# Patient Record
Sex: Female | Born: 1935 | Race: White | Hispanic: No | Marital: Married | Smoking: Never smoker
Health system: Southern US, Community
[De-identification: ages and names within clinical notes are randomized; demographics above are authoritative.]

## PROBLEM LIST (undated history)

## (undated) DIAGNOSIS — C50919 Malignant neoplasm of unspecified site of unspecified female breast: Secondary | ICD-10-CM

## (undated) DIAGNOSIS — I4891 Unspecified atrial fibrillation: Secondary | ICD-10-CM

## (undated) DIAGNOSIS — I499 Cardiac arrhythmia, unspecified: Secondary | ICD-10-CM

## (undated) DIAGNOSIS — I10 Essential (primary) hypertension: Secondary | ICD-10-CM

## (undated) DIAGNOSIS — T884XXA Failed or difficult intubation, initial encounter: Secondary | ICD-10-CM

## (undated) DIAGNOSIS — F419 Anxiety disorder, unspecified: Secondary | ICD-10-CM

## (undated) DIAGNOSIS — M1990 Unspecified osteoarthritis, unspecified site: Secondary | ICD-10-CM

## (undated) DIAGNOSIS — E039 Hypothyroidism, unspecified: Secondary | ICD-10-CM

## (undated) DIAGNOSIS — G4730 Sleep apnea, unspecified: Secondary | ICD-10-CM

## (undated) DIAGNOSIS — E119 Type 2 diabetes mellitus without complications: Secondary | ICD-10-CM

## (undated) DIAGNOSIS — T8859XA Other complications of anesthesia, initial encounter: Secondary | ICD-10-CM

## (undated) HISTORY — PX: HERNIA REPAIR: SHX51

## (undated) HISTORY — DX: Hypothyroidism, unspecified: E03.9

## (undated) HISTORY — PX: BREAST SURGERY: SHX581

## (undated) HISTORY — DX: Anxiety disorder, unspecified: F41.9

## (undated) HISTORY — PX: CATARACT EXTRACTION: SUR2

## (undated) HISTORY — PX: ABDOMINAL HYSTERECTOMY: SHX81

## (undated) HISTORY — PX: ABDOMINAL SURGERY: SHX537

## (undated) HISTORY — PX: CHOLECYSTECTOMY: SHX55

## (undated) HISTORY — PX: VEIN LIGATION: SHX2652

## (undated) HISTORY — DX: Type 2 diabetes mellitus without complications: E11.9

## (undated) HISTORY — PX: SMALL INTESTINE SURGERY: SHX150

## (undated) HISTORY — DX: Essential (primary) hypertension: I10

## (undated) HISTORY — PX: BREAST LUMPECTOMY: SHX2

## (undated) HISTORY — PX: OTHER SURGICAL HISTORY: SHX169

## (undated) HISTORY — PX: APPENDECTOMY: SHX54

---

## 1998-03-08 ENCOUNTER — Other Ambulatory Visit: Admission: RE | Admit: 1998-03-08 | Discharge: 1998-03-08 | Payer: Self-pay | Admitting: *Deleted

## 1999-03-27 ENCOUNTER — Other Ambulatory Visit: Admission: RE | Admit: 1999-03-27 | Discharge: 1999-03-27 | Payer: Self-pay | Admitting: *Deleted

## 2000-10-14 ENCOUNTER — Other Ambulatory Visit: Admission: RE | Admit: 2000-10-14 | Discharge: 2000-10-14 | Payer: Self-pay | Admitting: *Deleted

## 2001-07-28 ENCOUNTER — Encounter: Payer: Self-pay | Admitting: Emergency Medicine

## 2001-07-29 ENCOUNTER — Encounter: Payer: Self-pay | Admitting: General Surgery

## 2001-07-30 ENCOUNTER — Encounter: Payer: Self-pay | Admitting: General Surgery

## 2001-07-30 ENCOUNTER — Inpatient Hospital Stay (HOSPITAL_COMMUNITY): Admission: EM | Admit: 2001-07-30 | Discharge: 2001-08-04 | Payer: Self-pay | Admitting: Emergency Medicine

## 2004-05-05 ENCOUNTER — Inpatient Hospital Stay (HOSPITAL_COMMUNITY): Admission: EM | Admit: 2004-05-05 | Discharge: 2004-05-13 | Payer: Self-pay | Admitting: Emergency Medicine

## 2004-05-19 ENCOUNTER — Emergency Department (HOSPITAL_COMMUNITY): Admission: EM | Admit: 2004-05-19 | Discharge: 2004-05-19 | Payer: Self-pay | Admitting: Emergency Medicine

## 2005-01-28 ENCOUNTER — Ambulatory Visit: Payer: Self-pay | Admitting: Internal Medicine

## 2005-01-28 ENCOUNTER — Ambulatory Visit (HOSPITAL_COMMUNITY): Admission: RE | Admit: 2005-01-28 | Discharge: 2005-01-28 | Payer: Self-pay | Admitting: Internal Medicine

## 2006-10-06 HISTORY — PX: BREAST LUMPECTOMY: SHX2

## 2007-02-25 ENCOUNTER — Ambulatory Visit (HOSPITAL_COMMUNITY): Admission: RE | Admit: 2007-02-25 | Discharge: 2007-02-25 | Payer: Self-pay | Admitting: Internal Medicine

## 2007-03-17 ENCOUNTER — Ambulatory Visit: Payer: Self-pay | Admitting: Orthopedic Surgery

## 2007-03-26 ENCOUNTER — Ambulatory Visit (HOSPITAL_COMMUNITY): Admission: RE | Admit: 2007-03-26 | Discharge: 2007-03-26 | Payer: Self-pay | Admitting: Orthopedic Surgery

## 2007-03-29 ENCOUNTER — Ambulatory Visit: Payer: Self-pay | Admitting: Orthopedic Surgery

## 2007-04-28 ENCOUNTER — Ambulatory Visit: Payer: Self-pay | Admitting: Orthopedic Surgery

## 2007-05-22 ENCOUNTER — Emergency Department (HOSPITAL_COMMUNITY): Admission: EM | Admit: 2007-05-22 | Discharge: 2007-05-22 | Payer: Self-pay | Admitting: Emergency Medicine

## 2007-10-28 ENCOUNTER — Observation Stay (HOSPITAL_COMMUNITY): Admission: EM | Admit: 2007-10-28 | Discharge: 2007-10-30 | Payer: Self-pay | Admitting: Emergency Medicine

## 2007-12-15 ENCOUNTER — Emergency Department (HOSPITAL_COMMUNITY): Admission: EM | Admit: 2007-12-15 | Discharge: 2007-12-15 | Payer: Self-pay | Admitting: Emergency Medicine

## 2008-01-09 ENCOUNTER — Inpatient Hospital Stay (HOSPITAL_COMMUNITY): Admission: EM | Admit: 2008-01-09 | Discharge: 2008-01-09 | Payer: Self-pay | Admitting: Emergency Medicine

## 2010-05-22 ENCOUNTER — Ambulatory Visit (HOSPITAL_COMMUNITY)
Admission: RE | Admit: 2010-05-22 | Discharge: 2010-05-22 | Payer: Self-pay | Source: Home / Self Care | Admitting: Internal Medicine

## 2010-10-02 ENCOUNTER — Ambulatory Visit (HOSPITAL_COMMUNITY)
Admission: RE | Admit: 2010-10-02 | Discharge: 2010-10-02 | Payer: Self-pay | Source: Home / Self Care | Attending: Internal Medicine | Admitting: Internal Medicine

## 2010-10-27 ENCOUNTER — Encounter: Payer: Self-pay | Admitting: Internal Medicine

## 2011-02-11 ENCOUNTER — Ambulatory Visit (INDEPENDENT_AMBULATORY_CARE_PROVIDER_SITE_OTHER): Payer: Medicare Other | Admitting: Internal Medicine

## 2011-02-11 DIAGNOSIS — Z86010 Personal history of colonic polyps: Secondary | ICD-10-CM

## 2011-02-11 DIAGNOSIS — Z7901 Long term (current) use of anticoagulants: Secondary | ICD-10-CM

## 2011-02-18 NOTE — Discharge Summary (Signed)
Sheila Blair, Sheila Blair               ACCOUNT NO.:  [id number]   MEDICAL RECORD NO.:  [id number]          PATIENT TYPE:  INP   LOCATION:  A215                          FACILITY:  [facility]   PHYSICIAN:  Kingsley Callander. Ouida Sills, MD       DATE OF BIRTH:  [birth year]   DATE OF ADMISSION:  10/28/2007  DATE OF DISCHARGE:  01/24/2009LH                               DISCHARGE SUMMARY   DISCHARGE DIAGNOSES:  1. Altered mental status  2. Breast cancer.  3. Hypokalemia.  4. Hypertension.  5. Depression.  6. Anxiety.  7. Hyperlipidemia.  8. Impaired fasting glucose.  9. Elevated liver function studies.  10.History of colon adenomas.   PROCEDURES:  1. CT of the brain.  2. MRI of the brain.  3. EEG   HOSPITAL COURSE:  This patient is a 75 year old female who presented  with change in mental status.  She had been experiencing auditory and  visual hallucinations for 3 days.  She had undergone a left breast  lumpectomy and sentinel node procedure last week at [facility].  She had  taken several potential contributing medications including Darvocet,  Xanax and Elavil.  She also had drunk some wine intermittently.   Her initial CT scan of the brain revealed no acute abnormality.  The  radiologist commented on fairly extensive white matter changes and small  remote lacunar-type basal ganglia infarcts.  The patient has a history  of hypertension, impaired fasting glucose and hyperlipidemia.  She does  not smoke   She was seen in consultation by the ACT team.   Xanax was stopped.  She was given a detox regimen with thiamine,  multivitamins, folate and Ativan.  She was seen her in neurology  consultation by Dr. Gerilyn Pilgrim, and MRI of the brain was obtained which  revealed no acute abnormality.  There was no sign of metastatic disease.  Advanced white matter changes were noted and EEG revealed no  abnormalities.  Her TSH was normal at 5.0.  Magnesium was 1.9.  Sed rate  was 40.  She was initially mildly hypokalemic.   She was supplemented  orally to a potassium of 3.5 by discharge.  She had mild transaminase  elevations.  Her SGOT was initially 51, SGPT 41.  Repeat values were 67  and 62.   She showed gradual clearing of her mental status and was back to normal  by the morning of January 24.  Medications are being modified to exclude  Xanax.  Amitriptyline has been reduced to 25 mg at night.  Her  amitriptyline level remains pending.   Her urine drug screen was positive for benzodiazepines only.  This would  have been expected based on her medication use.  Darvocet is being  stopped.   She will have follow-up in the office in 2-3 weeks.  She will have  follow-up at [facility] for breast cancer next week.   DISCHARGE MEDICATIONS:  1. Ativan 1 mg 2-3 times a day.  2. Celexa 40 mg daily.  3. Amitriptyline 25 mg q.h.s.  4. Metoprolol 100 mg b.i.d.  5. Cardizem CD 180 mg  b.i.d.  6. Hydrochlorothiazide 25 mg daily.  7. Benicar 20 mg daily.  8. Aspirin 81 mg daily.  9. Colace 100 mg b.i.d.  10.Citrucel daily.  11.Multivitamin daily.  12.Fish oil b.i.d.  13.Systane p.r.n.  14.Os-Cal.  15.Daily potassium 20 mEq daily.   PLAN:  Electrolytes and LFTs will be rechecked in 2 weeks.      Kingsley Callander. Ouida Sills, MD  Electronically Signed     ROF/MEDQ  D:  10/30/2007  T:  10/31/2007  Job:  [id number]   cc:   Darleen Crocker A. Gerilyn Pilgrim, M.D.  Fax: [phone]

## 2011-02-18 NOTE — H&P (Signed)
Sheila Blair, Sheila Blair               ACCOUNT NO.:  [id number]   MEDICAL RECORD NO.:  [id number]          PATIENT TYPE:  INP   LOCATION:  A312                          FACILITY:  [facility]   PHYSICIAN:  Angus G. Renard Matter, MD   DATE OF BIRTH:  [birth year]   DATE OF ADMISSION:  01/09/2008  DATE OF DISCHARGE:  LH                              HISTORY & PHYSICAL   This 75 year old white female presented to the emergency department with  a chief complaint of abdominal pain.  This occurred after ingestion of  food.  Apparently has had multiple obstructions in the past that  required surgery, most recent was 5 years ago.  She does have breast  cancer in process of radiation treatment.  The pain began on the day of  admission, constant.  X-rays according to the ED physician showed  dilatation of colon but no evidence of small bowel obstruction.  The  patient was given Dilaudid and placed on intravenous fluids and  subsequently admitted.   LABORATORY DATA:  Chemistries - sodium 136, potassium 3.3, chloride 101,  CO2 24, glucose 149, BUN 70, creatinine 0.8.  A CBC showed a white count  of 13,100 with a hemoglobin of 12.3 and hematocrit 35.8.   SOCIAL HISTORY:  The patient does not smoke or drink alcohol except on  occasion.   PAST MEDICAL HISTORY:  History of hypertension, anxiety, bowel  obstruction, breast cancer, depression, hyperlipidemia.   PAST SURGICAL HISTORY:  History of breast lumpectomy, cholecystectomy,  hernia repair, hysterectomy.  The patient has had multiple surgeries for  bowel obstruction in the past.  High blood pressure.   MEDICATION:  1. Benicar 40 mg b.i.d.  2. Cardizem CD 180 mg b.i.d.  3. Celexa 40 mg daily.  4. Citrucel fiber laxative once daily.  5. Colace 100 mg b.i.d.  6. Hydrochlorothiazide 25 mg daily.  7. Lopressor 100 mg b.i.d.  8. Simethicone 80 mg every 4 hours.  9. MiraLax as needed.  10.KCl 20 mEq daily.  11.Zofran 4 mg q.4 h. p.r.n.  12.Ativan 1 mg q.4 h.  p.r.n. pain.   REVIEW OF SYSTEMS:  HEENT:  Negative.  CARDIOPULMONARY:  No chest pain.  No cough or dyspnea.  GI:  Nausea and abdominal pain.  No diarrhea or  bleeding.  GU:  No dysuria or hematuria.   PHYSICAL EXAMINATION:  VITAL SIGNS:  Pending.  HEENT:  Negative.  NECK:  Supple.  No JVD or thyroid abnormalities.  LUNGS:  Clear to P and A.  HEART:  Regular rhythm, no murmurs.  ABDOMEN:  Slightly distended.  Tenderness over the lower abdomen.  No  organomegaly.  PELVIC:  Not performed.  EXTREMITIES:  Free of edema.   ASSESSMENT:  The patient was admitted with abdominal pain thought to be  possible obstruction.  She does have a history of prior surgery for  bowel obstruction.  She does have a history of hypertension, anxiety,  previous history of breast lumpectomy for cancer, cholecystectomy,  hernia repair, hysterectomy.   PLAN:  To continue current IV fluids and pain control.  Will repeat CT  today and obtain appropriate surgical consultation if needed.      Angus G. Renard Matter, MD  Electronically Signed     AGM/MEDQ  D:  01/09/2008  T:  01/09/2008  Job:  [id number]

## 2011-02-18 NOTE — H&P (Signed)
Sheila Blair, DRIVER               ACCOUNT NO.:  [id number]   MEDICAL RECORD NO.:  [id number]          PATIENT TYPE:  INP   LOCATION:  A215                          FACILITY:  [facility]   PHYSICIAN:  Kingsley Callander. Ouida Sills, MD       DATE OF BIRTH:  02-Jun-1936   DATE OF ADMISSION:  10/28/2007  DATE OF DISCHARGE:  LH                              HISTORY & PHYSICAL   CHIEF COMPLAINT:  Change in mental status.   </HISTORY OF PRESENT ILLNESS/  This patient is a 75 year old white female who presented to the  emergency room with her husband due to hallucinations. The symptoms  began 3 days ago.  She has been seeing people in her house and hearing  people in her house. She ran outside in the cold weather barefooted  hollering at these people on the night of admission.  She had been in  her usual state of health until 3 days ago. She had undergone a left  breast lumpectomy with a sentinel node procedure last week. The nodes  were evidently negative. She has a history of depression and anxiety and  has been treated with Celexa, amitriptyline and Xanax.  She has also  consumed a couple of glasses of wine at night.  She stopped drinking  wine for a couple of nights.  She has not had any prior trouble with  withdrawal. She has not experienced fever or any other neurological  problems.  Her husband states that she did have some hallucinations once  after a surgery at [facility] several years ago. She was evaluated in the  emergency room. A head CT revealed no acute abnormality.  She was  treated with doses of Xanax, Ativan and Benadryl.   PAST MEDICAL HISTORY:  1. Hypertension.  2. Depression/anxiety.  3. Hyperlipidemia.  4. Impaired fasting glucose.  5. Colon adenomas.  6. Hysterectomy.  7. Oophorectomy.  8. Cholecystectomy.  9. Partial thyroidectomy.  10.Multiple laparotomies for bowel obstructions.  11.Breast cancer.   MEDICATIONS:  1. Benicar 20 mg daily.  2. Metoprolol 100 mg b.i.d.  3. Cardizem CD  180 mg daily.  4. Hydrochlorothiazide 25 mg daily.  5. Xanax 1 mg b.i.d.  6. Celexa 40 mg daily.  7. Amitriptyline 50 mg q.h.s.  8. Niacin 1000 mg daily  9. Aspirin 81 mg daily.  10.Multivitamin daily.  11.Colace 100 mg b.i.d.  12.Citrucel daily.  13.Darvocet p.r.n.   ALLERGIES:  CODEINE and MORPHINE.   FAMILY HISTORY:  Her mother died at 76 of congestive heart failure.  Her  father had a hip fracture and pneumonia and died at 24. A sister has had  dementia and hypertension. Two daughters are well.   REVIEW OF SYSTEMS:  She is not taking any pain medication. She had taken  Darvocet immediately after the surgery. No fever, difficulty breathing  or problems with her bowel or bladder habits.   PHYSICAL EXAM:  Temperature 98.6, blood pressure 143/79, pulse 74,  respirations 20, oxygen saturation 99%.  GENERAL:  Alert and in no acute distress at present.  HEENT:  Eyes and oropharynx appear  normal.  NECK:  Supple without JVD or thyromegaly.  LUNGS:  Clear.  HEART:  Regular with no murmurs.  ABDOMEN:  Nontender.  No hepatosplenomegaly.  BREASTS:  Steri-Strips are in place on the left breast and in the left  axilla. There is no sign of wound infection.  ABDOMEN:  Nontender. No hepatosplenomegaly.  She has healed surgical  scars.  EXTREMITIES:  Normal pulses.  No edema.  NEURO:  She is fully oriented. She has actively hallucinated though in  the emergency room and is actually seeing people who are not there.  She  has seen these people and heard them and has thought they were former  students and their families. No focal weakness, slurring of speech or  change in facial symmetry.  LYMPH NODES:  No cervical or supraclavicular enlargement.  SKIN:  Warm and dry.   LABORATORY DATA:  White count 6.3, hemoglobin 13.1, platelets 345,000.  Sodium 138, potassium 3.2, bicarb 29, glucose 131, BUN 11, creatinine  0.46.  Urinalysis reveals 0-2 white cells, rare bacteria and a few  epithelial  cells.  The CT scan of the brain reveals no acute  abnormality.   IMPRESSION:  1. Altered mental status with recent psychotic features, including      visual and auditory hallucinations. She really does not display a      typical withdrawal-type syndrome.  She has had some starting and      stopping of both alcohol and benzodiazepines recently. There is no      history of any recreational drug use.  A drug screen is being      obtained.  A neurology consultation will be obtained.  I have      discussed her case with Dr. Gerilyn Pilgrim. Will also check a B12, TSH,      and sed rate.  2. Alcohol use.  Will treat with thiamine, multivitamin, folic acid      and Ativan.  3. Hypokalemia.  Supplement orally.  4. Hypertension.  Continue antihypertensive regimen. She is mildly      hypertensive in the ER.  5. History of depression and anxiety.  Continue Celexa.  6. Hyperlipidemia.  7. Hyperglycemia 131.   ADDENDUM:  She has also been seen by the ACT team, who recommended a  psychiatric consultation.      Kingsley Callander. Ouida Sills, MD  Electronically Signed     ROF/MEDQ  D:  10/28/2007  T:  10/28/2007  Job:  [id number]   cc:   Darleen Crocker A. Gerilyn Pilgrim, M.D.  Fax: [phone]

## 2011-02-18 NOTE — Consult Note (Signed)
NAMECHAUNDA, Sheila Blair               ACCOUNT NO.:  [id number]   MEDICAL RECORD NO.:  [id number]          PATIENT TYPE:  INP   LOCATION:  A215                          FACILITY:  [facility]   PHYSICIAN:  Kofi A. Gerilyn Pilgrim, M.D. DATE OF BIRTH:  June 20, 1936   DATE OF CONSULTATION:  10/28/2007  DATE OF DISCHARGE:                                 CONSULTATION   REASON FOR CONSULTATION:  Altered mental status.   HISTORY:  This is a 75 year old white female who underwent breast biopsy  for suspected left breast malignancy.  Apparently nodes were negative.  The procedure was done about a week ago.  The patient was given Darvocet  after the procedure and the following day developed visual  hallucinations.  The objects are formed and essentially involve people,  some of whom she recognizes.  Oftentimes they are scary however.  She  also has had auditory hallucinations with people talking to her through  the radio or she may be hearing them downstairs trying to break into the  house.  The patient has been very convinced that they are real, though  at the end of our conversation together she does admit that they may not  be real.  Again, however, she does seem to indicate that she believes at  least some part of it is real.  There has not been any focal  neurological deficits but the family does report some dysarthria,  however, no headaches, no fevers.  No visual obscurations other than the  visual hallucinations.  There is a previous history of psychiatric  problem but this is essentially limited to affective disorders.  There  is no psychosis and no history of bipolar disorder.  No loss of  consciousness.  No seizures are reported.   PAST MEDICAL HISTORY:  Hypertension, depression, thyroid disorder,  hyperlipidemia, impaired glucose, colon adenomas, breast cancer status  post lumpectomy a week ago.   PAST SURGICAL HISTORY:  Left breast lumpectomy a week ago.  Hysterectomy, oophorectomy,  cholecystectomy, partial thyroidectomy,  small bowel obstruction.   ADMISSION MEDICATIONS:  1. Benicar 20 mg daily.  2. Metoprolol 100 mg b.i.d.  3. Cardizem CD 180 daily.  4. Hydrochlorothiazide 25 mg daily.  5. Xanax 1 mg b.i.d.  6. Celexa 40 mg daily.  7. Amitriptyline 50 mg daily at bedtime given because of her long      history of chronic headaches.  8. Niacin 1 g daily.  9. Aspirin 81 mg daily.  10.Multivitamin.  11.Colace 100 mg b.i.d.  12.Darvocet p.r.n.  13.Citrucel daily.   ALLERGIES:  MORPHINE, CODEINE.   FAMILY HISTORY:  Positive for congestive heart failure, pneumonia,  dementia, hypertension.   REVIEW OF SYSTEMS:  As stated in history of present illness.  Otherwise,  unrevealing.   PHYSICAL EXAMINATION:  VITAL SIGNS:  Temperature 98.7, blood pressure  129/73, pulse 60, saturation 91% on room air.  GENERAL:  Shows a pleasant, moderately overweight lady in no acute  distress.  HEENT:  Neck is supple.  Head is normocephalic, atraumatic.  ABDOMEN:  Soft.  EXTREMITIES:  No edema though there are some  varicosities in the feet  and legs.  MENTATION:  The patient is awake and alert.  She does seem to have some  mild dysarthria.  She is quite tangential and talkative.  She does  follow commands well.  Language seems to be intact.  CRANIAL NERVES:  Pupils are equal, round and reactive to light and  accommodation.  Extraocular movements are full.  Visual fields are  intact.  Facial muscle strength is symmetric.  Tongue is midline.  Uvula  midline.  Shoulder shrug normal.  Motor examination shows normal tone,  bulk and strength.  There is no pronator drift.  Coordination is intact.  There is no dysmetria, no tremors, no parkinsonism.  Reflexes are +2 and  symmetric with downgoing plantar reflexes.  Sensation normal to  temperature and light touch.   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  This is  worrisome for posterior reversible syndromes, seems out of proportion to  the patient's history of hypertension.  Nothing acute is seen.  There  are similar findings in the frontal areas but to a much less degree.  TSH 5, urine drug screen positive for benzodiazepine, ESR 40, sodium  138, potassium 3.2, chloride 99, CO2 29, BUN 11, creatinine 0.4, glucose  131, WBC 6.3, hemoglobin 13, platelet count of 345.  Urinalysis  negative.  AST 51, ALT 41.   ASSESSMENT:  Acute onset of visual and auditory hallucinations.  I do  not believe this is a primary psychiatric problem.  I believe this is  mostly an organic issue.  I am very suspicious of the Darvocet which was  started raising the possibility of a medication effect.  Given that I am  also concerned about her CT scan findings certainly occipital and  parietal lesions can be associated with visual hallucinations.  Given CT  findings one is posterior reversible syndrome such as hypertensive  encephalopathy.  Certainly given history of breast cancer she certainly  could have a lesion that we are not seeing on CT scan.  Rarely  hallucinations can be associated with seizures.  This is however again  rare.   RECOMMENDATIONS:  1. MRI with and without contrast.  2. EEG.  3. Obtain biopsy results from [facility].  4. I think if possible should reduce the dose of the Elavil which can      cause problems in elderly patients; agree with the amitriptyline      level.  5. The patient's symptoms can be treated with low dose neuroleptics if      they remain problematic.   Thank you for this consultation.     Kofi A. Gerilyn Pilgrim, M.D.  Electronically Signed    KAD/MEDQ  D:  10/29/2007  T:  10/29/2007  Job:  [id number]

## 2011-02-18 NOTE — Procedures (Signed)
Sheila Blair, MURLEY               ACCOUNT NO.:  [id number]   MEDICAL RECORD NO.:  [id number]          PATIENT TYPE:  INP   LOCATION:  A215                          FACILITY:  [facility]   PHYSICIAN:  Kofi A. Gerilyn Pilgrim, M.D. DATE OF BIRTH:  April 23, 1936   DATE OF PROCEDURE:  DATE OF DISCHARGE:                              EEG INTERPRETATION   INDICATIONS:  This is 75 year old lady who presents with altered mental  status, confusional spells, suspicious for seizures.   MEDICATIONS:  Amitriptyline, aspirin, Benicar, Cardizem, Celexa,  Lopressor, hydrochlorothiazide, niacin.   ANALYSIS:  A 16-channel recording is conducted for approximately 24  minutes using standard 10/20 measurements.  There is a well-formed pulse  rhythm of 7 Hertz which attenuates with eye opening.  There is beta  activity noted in the frontal areas.  Awake and drowsy activities are  recorded.  Photic stimulation and hyperventilation are conducted without  significant changes int background activity.  There is no focal or  lateralizing slowing.  There is no epileptiform activity observed.   IMPRESSION:  Normal recording, awake and drowsy states.  A single  recording does not rule out epileptic seizures. A clinically indicated  sleep-deprived recording could be useful.      Kofi A. Gerilyn Pilgrim, M.D.  Electronically Signed     KAD/MEDQ  D:  10/29/2007  T:  10/30/2007  Job:  [id number]

## 2011-02-21 ENCOUNTER — Other Ambulatory Visit (INDEPENDENT_AMBULATORY_CARE_PROVIDER_SITE_OTHER): Payer: Self-pay | Admitting: Internal Medicine

## 2011-02-21 ENCOUNTER — Encounter (HOSPITAL_BASED_OUTPATIENT_CLINIC_OR_DEPARTMENT_OTHER): Payer: Medicare Other | Admitting: Internal Medicine

## 2011-02-21 ENCOUNTER — Ambulatory Visit (HOSPITAL_COMMUNITY)
Admission: RE | Admit: 2011-02-21 | Discharge: 2011-02-21 | Disposition: A | Discharge status: Home / Self Care | Payer: Medicare Other | Source: Ambulatory Visit | Attending: Internal Medicine | Admitting: Internal Medicine

## 2011-02-21 DIAGNOSIS — Z860100 Personal history of colon polyps, unspecified: Secondary | ICD-10-CM | POA: Insufficient documentation

## 2011-02-21 DIAGNOSIS — Z853 Personal history of malignant neoplasm of breast: Secondary | ICD-10-CM | POA: Insufficient documentation

## 2011-02-21 DIAGNOSIS — D126 Benign neoplasm of colon, unspecified: Secondary | ICD-10-CM

## 2011-02-21 DIAGNOSIS — K5730 Diverticulosis of large intestine without perforation or abscess without bleeding: Secondary | ICD-10-CM | POA: Insufficient documentation

## 2011-02-21 DIAGNOSIS — E119 Type 2 diabetes mellitus without complications: Secondary | ICD-10-CM | POA: Insufficient documentation

## 2011-02-21 DIAGNOSIS — Z79899 Other long term (current) drug therapy: Secondary | ICD-10-CM | POA: Insufficient documentation

## 2011-02-21 DIAGNOSIS — I10 Essential (primary) hypertension: Secondary | ICD-10-CM | POA: Insufficient documentation

## 2011-02-21 NOTE — Consult Note (Signed)
NAME:  Sheila Blair, Sheila Blair NO.:  [id number]   MEDICAL RECORD NO.:  [id number]                   PATIENT TYPE:  EMS   LOCATION:  ED                                   FACILITY:  [facility]   PHYSICIAN:  Barbaraann Barthel, M.D.              DATE OF BIRTH:  1936-01-10   DATE OF CONSULTATION:  05/19/2004  DATE OF DISCHARGE:                                   CONSULTATION   Surgery was asked to see this 75 year old white female who is 14 days status  post small-bowel resection for closed loop obstruction.  She presented in  the emergency room with nausea and vomiting and bloating with a high-grade  obstruction noted on acute abdominal series and CT scan.  She had  approximately 1400 ml of aspirate from her NG.  I reviewed these films with  the radiologist, and we had planned to take her to surgery.   Anesthesiology was not available to provide the difficult bronchoscopic  endoscopic intubation necessary with this patient who has been previously  identified as one who has an extremely difficult airway.  Therefore, I was  unable to take her to surgery and discussed with in detail with Dr.  Gerarda Fraction, giving him details of her previous surgery including her previous  surgeries with repair and abdominal wall repair done at [facility], etc., and he  accepted this patient in transfer.  In essence, we are transferring her  because we are unable to safely provide an airway on this patient, and in my  opinion, she needs to go to surgery sooner rather than later.   We then made plans for her transfer.      ___________________________________________                                            Barbaraann Barthel, M.D.   WB/MEDQ  D:  05/19/2004  T:  05/19/2004  Job:  [id number]   cc:   Rhae Lerner. Margretta Ditty, M.D.  [address]  [place]  [place] [address]   Danice Goltz, M.D. [facility]   Kingsley Callander. Ouida Sills, M.D.  [address]  [place]  [place] [address]  Fax: [phone]

## 2011-02-21 NOTE — Consult Note (Signed)
[facility]  Patient:    Sheila Blair, Sheila Blair Visit Number: [id number] MRN: [id number]          Service Type: MED Location: 3A A310 01 Attending Physician:  Barbaraann Barthel Dictated by:   Carylon Perches, M.D. Proc. Date: 07/29/01 Admit Date:  07/28/2001 Discharge Date: 08/04/2001   CC:         Barbaraann Barthel, M.D.   Consultation Report  CHIEF COMPLAINT:  Abdominal pain.  HISTORY OF PRESENT ILLNESS:  This patient is a 75 year old white female who presented to the emergency room by ambulance complaining of abdominal pain and distention.  She is three weeks status post a bladder tack and right oophorectomy at [facility].  This was complicated by postop ileus which required a week-long rehospitalization there.  She had been recuperating fairly smooth, though, until approximately 3 p.m. on the day of admission when she had sudden onset of crampy abdominal pain.  She experienced heaving.  She has had no recent fever or chills.  She had normal bowel movements prior to that.  There had been no rectal bleeding.  She was found to have a partial small-bowel obstruction and was hospitalized for nasogastric suction, bowel rest, and IV hydration.  She has been evaluated surgically by Dr. Malvin Johns, and the request was made for medical followup while hospitalized.  PAST MEDICAL HISTORY: 1. Laparoscopic cholecystectomy in 1995. 2. Left oophorectomy in 1965. 3. Thyroid surgery for benign tumor in 1966. 4. Left leg vein ligation in 1985. 5. Hypertension.  MEDICATIONS: 1. Lotrel 5/20 q.d. 2. Atenolol 50 mg q.d. 3. HCTZ 25 mg q.d. 4. Paxil 20 mg q.d. 5. Ativan 1 mg q.h.s.  ALLERGIES:  CODEINE and MORPHINE.  FAMILY HISTORY:  Her mother died at 62 of congestive heart failure.  Her father died at 15 and had had a hip fracture and pneumonia.  She has a sister with hypertension and two daughters who are well.  REVIEW OF SYSTEMS:  Her appetite had been fine.  There had  been no weight loss.  She has had no fevers, chills, difficulty breathing, or difficulty voiding after her catheter was removed.  No dysuria or gross hematuria.  PHYSICAL EXAMINATION:  VITAL SIGNS:  Temperature 97.2, pulse 67, blood pressure 116/70 (initially 77/48 in the ER).  GENERAL:  Alert and comfortable at the time of my exam, although she had reportedly been diaphoretic and extremely uncomfortable on presentation.  HEENT:  No scleral icterus.  Oropharynx is unremarkable.  NECK:  Supple with no thyromegaly or lymphadenopathy.  She has a well-healed thyroidectomy scar.  LUNGS:  Clear.  HEART:  Regular with no murmurs.  ABDOMEN:  Nondistended, nontender.  No hepatosplenomegaly or palpable mass.  A nasogastric tube and a Foley catheter are in placed.  EXTREMITIES:  No calf tenderness, edema, cyanosis, or clubbing.  NEUROLOGIC:  Grossly intact.  LABORATORY DATA:  See printout.  IMPRESSION: 1. Small-bowel obstruction postoperative pelvic surgery.  She has responded    quite favorably thus far to nasogastric suction, intravenous hydration,    and bowel rest.  Her pain is presently relieved.  She has had approximately    1500 cc of nasogastric fluid output.  Will follow with repeat bowel films. 2. Hypokalemia.  Her serum potassium has normalized on intravenous    supplementation. 3. Hypertension.  Would advise atenolol, hydrochlorothiazide, and Lotrel be    held for now. 4. Postoperative depression.  Continue Paxil when she is able to swallow    liquids. 5. Abnormal urinalysis.  She has white cells in the urine.  A urine culture    will be obtained. Dictated by:   Carylon Perches, M.D. Attending Physician:  Barbaraann Barthel DD:  07/29/01 TD:  07/30/01 Job: [id number]

## 2011-02-21 NOTE — Consult Note (Signed)
NAME:  Sheila Blair, Sheila Blair                         ACCOUNT NO.:  [id number]   MEDICAL RECORD NO.:  [id number]                   PATIENT TYPE:  INP   LOCATION:  A319                                 FACILITY:  [facility]   PHYSICIAN:  Barbaraann Barthel, M.D.              DATE OF BIRTH:  1935/11/20   DATE OF CONSULTATION:  05/05/2004  DATE OF DISCHARGE:                                   CONSULTATION   REASON FOR CONSULTATION:  Surgery was asked to see this 75 year old white  female with abdominal pain, nausea, and bloating.   HISTORY OF PRESENT ILLNESS:  The patient states that she was in her  otherwise state of good health with no change in her bowel habits, when last  night at around 11 o'clock after a rather heavy meal of cream sauces, sour  cream, etc., she became bloated and developed nausea and distention.  She  came to the emergency room.  An acute abdominal series showed a partial  small bowel obstruction.  Surgery was consulted.  The patient had no  vomiting and she passed a small amount of gas later on in the evening.  However, she did not improve and came to the emergency room this morning.   PHYSICAL EXAMINATION:  GENERAL:  Discloses a pleasant but uncomfortable  appearing 75 year old white female in no acute distress other than her  discomfort.  VITAL SIGNS:  Her temperature is 97.2, blood pressure 122/79, heart rate  90/minute, respirations 20/minute.  HEENT:  Head is normocephalic.  Eyes, extraocular movements are intact.  Pupils are round and reactive to light and accommodation.  No conjunctival  edema and sclerae is a normal tincture.  The patient has had previous  cataract surgery.  Nose and oral mucosa are moist.  NECK:  Supple and cylindrical without jugular venous distention,  thyromegaly, or tracheal deviation.  There is no cervical adenopathy and no  bruits auscultated.  HEART:  Regular rhythm.  LUNGS:  Clear.  BREASTS/AXILLA:  Without masses.  ABDOMEN:  Distended and  tympanic.  Bowel sounds are hypoactive but present.  The patient has no visceromegaly.  I do not appreciate any incisional  hernia.  She has a previous midline incision but no obvious incarcerated  hernia here or any groin hernias.  RECTAL:  The stool is guaiac negative.  EXTREMITIES:  Within normal limits.  Pulses are symmetrical.  There is no  pedal edema.  The patient has had a right vein ligation in the past.   PAST SURGICAL HISTORY:  She has had multiple surgeries.  It should noted  that almost all of her abdominal surgery has been characterized with  sluggish return to normal bowel activity and she has had previous partial  small bowel obstructions following her surgeries including a lysis of  adhesions for a small bowel obstruction in 2002.  In 2004 she had a ventral  hernia repair at [facility], which also was  complicated with a prolonged ileus in  2004.  Other surgeries have included an ovarian tumor resection in 1965.  Partial thyroidectomy for a benign process in 1966.  Right vein ligation in  1968.  Hysterectomy in 1985.  Laparoscopic gallbladder surgery in 1993.  Vaginal prolapse repair, bladder repair, and oophorectomy in 2002.  This was  complicated later with an intestinal blockage.  I operated on her for that  in 2002 after her previous surgery at [facility].  She also had a rectocele repair  in January 2004 and a ventral hernia repair in April 2004, which was as  stated complicated by an ileus.   PAST MEDICAL HISTORY:  1. Hypertension.  2. Anxiety.   MEDICATIONS:  1. Atenolol 50 mg p.o. b.i.d.  2. Lotrel 5/20 mg 2 times a day.  3. Hydrochlorothiazide 25 mg daily.  4. Amitriptyline 50 mg a day.  5. Niacin 500 mg a day.  6. Aspirin 81 mg a day.  7. Colace 100 mg a day.  8. Citrucel 10.2 g a day.  9. Fish oil 100 mg a day.  10.      Multivitamin.  11.      Calcium with D 600 mg 2 times a day.  12.      Clonidine, I am not sure about the dose, b.i.d.   ALLERGIES:  No known  allergies.   REVIEW OF SYSTEMS:  CARDIORESPIRATORY:  The patient has had no previous  history of a myocardial infarction.  She is a nonsmoker and she does not  abuse alcohol.  She is hypertensive and takes atenolol and Lotrel.  GASTROINTESTINAL:  History of multiple problems with prolonged ileus and  constipation.  Acute history of nausea, bloating, and distention.  No  vomiting.  She has had no previous history of hepatitis.  Previous abdominal  surgeries included a cholecystectomy as well as an appendectomy in the past.  The patient also underwent a colonoscopy and polypectomy in the past.  She  gives no  history, apart from her constipation which has been controlled  with her Citrucel and stool softeners, she has had no bright red rectal  bleeding, black tarry stools, or change in her bowel habits.  Her last time  she passed gas was last night.  OB/GYN:  She is a gravida 2, para 2,  cesarean 0, abortus 0 female who has undergone several pelvic surgeries,  which have included an ovarian cystectomy and later an oophorectomy followed  by a total abdominal hysterectomy and oophorectomy.  She has undergone  recently an anterior and posterior repair, which was complicated by a hernia  and was repaired with mesh as well.  GENITOURINARY:  She has had a history  of urinary tract infection.  Her urinalysis shows that she may have a  urinary tract infection at present.  She has had a bladder tack in the  present.  No past history of nephrolithiasis.  ENDOCRINE:  The patient has  undergone a partial thyroidectomy in the past for benign disease.  She is on  no thyroid replacement.  MUSCULOSKELETAL:  Grossly within normal limits.   LABORATORY DATA:  The patient has had an acute abdominal series, which shows  a partial small bowel obstruction.  Her white count is 8.0 with an H&H of 14.5 and 41.5 with 83 neutrophils.  Her electrolytes show 3.1 hypokalemia.  Her BUN is 19 and creatinine is 0.9.  Her  lipase was reported as normal.  Her urinalysis shows bacteria.  A CT scan is pending.   IMPRESSION:  1. Partial small bowel obstruction with hypokalemia.  2. Questionable urinary tract infection.  3. Hypertension.  4. Anxiety.  5. Chronic constipation.   PLAN:  We have initiated hydration and electrolyte replacement.  I have  placed an NG tube.  We will probably need to repeat the CT scan, as I  removed quite a bit of contrast medium from her stomach.  We will follow her  with serial acute abdominal series and see if there are any changes  requiring surgery.  Hopefully, she will open up with a nonoperative  approach.  Otherwise, if she has any acute changes suggesting complete small  bowel obstruction or no improvement we will plan for a laparotomy and lysis  of adhesions.  I suspect this process is secondary to these due to her  multiple intraabdominal and pelvic surgeries.  I will follow her closely  with the medical service.  Thank you for the consultation.      ___________________________________________                                            Barbaraann Barthel, M.D.   WB/MEDQ  D:  05/05/2004  T:  05/05/2004  Job:  [id number]   cc:   Delbert Harness, MD   Kingsley Callander. Ouida Sills, M.D.  [address]  [place]  [place] [address]  Fax: [phone]

## 2011-02-21 NOTE — Discharge Summary (Signed)
NAMEYOCELINE, BAZAR               ACCOUNT NO.:  [id number]   MEDICAL RECORD NO.:  [id number]          PATIENT TYPE:  INP   LOCATION:  A312                          FACILITY:  [facility]   PHYSICIAN:  Angus G. Renard Matter, MD   DATE OF BIRTH:  Jun 09, 1936   DATE OF ADMISSION:  01/09/2008  DATE OF DISCHARGE:  04/05/2009LH                               DISCHARGE SUMMARY   HISTORY:  This is a 75 year old white female admitted on January 09, 2008,  and discharged January 09, 2008, one-day hospitalization.   DIAGNOSES:  High-grade small-bowel obstruction due to adhesions and  central mesentery involving the middistal ileum; small left adrenal  gland lesion, likely benign adenoma; anterior abdominal wall hernia  through the mesh, there is a part of transverse colon in it;  hypertension; hyperlipidemia; history of breast cancer; and depression.   The patient's condition stable at time of her transfer.  This 71-year-  old white female presented to the emergency department with a chief  complaint of abdominal pain.  This occurred after ingestion of food.   The patient has had multiple bowel obstructions in the past that  required surgery, most recent was 5 years ago.  She does have a history  of breast cancer.  She is in the process of irradiation treatment.  The  pain in her abdomen began on the day of admission, it was constant.  X-  rays according to ED physician showed dilatation of colon, but no  evidence of small-bowel obstruction.  The patient was given Dilaudid,  placed on intravenous fluids, and admitted.  Subsequent CT of the  abdomen did show evidence of obstruction, high-grade small bowel due to  adhesions and central mesentery involving the mid distal ileum, anterior  abdominal wall hernia just to the left  of the midline through the mesh  containing colon.   PHYSICAL EXAMINATION:  HEENT: Negative.  NECK: Supple.  No JVD or thyroid abnormalities.  LUNGS: Clear P&A.  HEART: Regular.  No  murmurs.  ABDOMEN: Slightly distended, tenderness over the lower abdomen.  No  organomegaly.  PELVIC: Not performed.  EXTREMITIES:  Free of edema.   LABORATORY DATA:  CBC:  WBC 11,100 with hemoglobin 12.3 and hematocrit  35.8.  Chemistries:  Sodium 136, potassium 3.3, chloride 101, CO2 of 24,  glucose 149, BUN 17, and creatinine 0.80.  GFR greater than 60.  X-rays:  Acute abdominal series on admission, chronic colonic and small bowel  dilatation, could not rule out recurrent distal obstruction.  A CT of  the abdomen showed evidence of high-grade small-bowel obstruction due to  adhesions and central mesentery involving the mid distal ileum; stable  benign-appearing hepatic cyst, status post cholecystectomy; stable small  left adrenal gland lesion, likely benign adenoma; anterior abdominal  wall hernia through the mesh that had part of the transverse colon in  it.   HOSPITAL COURSE:  The patient at the time of her admission was placed on  normal saline at 100 mL an hour and clear liquid diet.  She was given  Dilaudid 1 mg every 3 to 4 hours p.r.n.  for pain and Zofran 4 mg every 6  hours p.r.n. for nausea.  She was continued on Benicar 40 mg b.i.d.,  Cardizem CD 180 mg b.i.d., Celexa 40 mg daily, Citrucel fiber laxative 1  tablespoon at bedtime, Colace 100 mg b.i.d., hydrochlorothiazide 25 mg  daily, Lopressor 100 mg b.i.d., Ativan 1 mg at bedtime, potassium 20 mEq  daily, Os-Cal 500 mg p.o. daily, and Lovenox subcutaneously daily.  The  patient remained fairly comfortable following her admission.  She  desired to go back to [facility] for surgery.  She said she had  been receiving her chemotherapy there as well as prior surgery there.  I  have discussed the situation with the physicians at [facility], and they  agreed to take her under the services of Dr. Lowell Guitar, Oncology.  The  patient was stable at the time of her discharge.   DISCHARGE MEDICATIONS:  1. Benicar 40 mg b.i.d.  2.  Cardizem CD 180 mg b.i.d.  3. Celexa 40 mg daily.  4. Citrucel fiber laxative 1 tablespoon daily.  5. Colace 100 mg b.i.d.  6. Hydrochlorothiazide 25 mg daily.  7. Lopressor 100 mg b.i.d.  8. Systane eye drops one daily.  9. Simethicone 80 mg every four hours.  10.MiraLax as needed.  11.Ativan 1 mg at night as needed.  12.Potassium chloride 20 mEq daily.   DISPOSITION:  The patient was stable at the time of her transfer to  [facility].      Harrison G. Renard Matter, MD  Electronically Signed     AGM/MEDQ  D:  01/28/2008  T:  01/28/2008  Job:  [id number]

## 2011-02-21 NOTE — Op Note (Signed)
[facility]  Patient:    Sheila Blair, Sheila Blair Visit Number: [id number] MRN: [id number]          Service Type: MED Location: 3A A310 01 Attending Physician:  Barbaraann Barthel Dictated by:   Barbaraann Barthel, M.D. Proc. Date: 07/30/01 Admit Date:  07/28/2001   CC:         Carylon Perches, M.D.   Operative Report  PREOPERATIVE DIAGNOSIS:  Small-bowel obstruction.  POSTOPERATIVE DIAGNOSIS:  Small-bowel obstruction.  PROCEDURE:  Exploratory laparotomy with lysis of adhesions and release of small bowel obstruction.  SURGEON:  Barbaraann Barthel, M.D.  ANESTHESIA:  NOTE:  This is a 75 year old white female who is approximately 24 or 25 days status post pelvic surgery at [facility], at which time she had a colpopexy and Burch procedure.  She presented to the emergency room with distention, nausea and vomiting and hypotension.  She was admitted after hydration and serial acute abdominal series revealed no improvement of her bowel pattern and clinically no resolution of her partial small-bowel obstruction and she was taken to surgery.  We discussed preoperatively in detail the surgery including, but not limiting a discussion of risks to include bleeding, infection and the possibility that she may require bowel resection and an unlikely but possible colostomy. Informed consent was obtained.  GROSS OPERATIVE FINDINGS:  The patient had in the area of the mid-jejunum, a loop of bowel adhesed into the pelvis with a thick, approximately 1-cm to a 1-1/2-cm annular adhesion around the bowel.  This was released.  She also was noted to have the portion of her rectosigmoid adherent to her fascial closure. This was likewise released.  No other abnormalities were encountered.  The patient was status post TAH/BSO and appendectomy and gallbladder surgery.  The rest of her small bowel, apart from the dilated proximal loops of bowel, was normal.  The colon was also  normal.  TECHNIQUE:  The patient was placed in a supine position and after the adequate administration of general anesthesia via endotracheal intubation, her entire abdomen was prepped with Betadine solution and draped in the usual manner.  An infraumbilical midline incision was carried out and the abdomen was opened through the midline, carefully dissecting free adhesions down into the pelvis where a portion of the mid-jejunum was identified as being dilated and adhesed.  This was released.  There was bruising of the mesentery but after observing the bowel, the bowel pinked up very well with obvious peristalsis noted and appeared viable without the need for resection.  The patient was also noted to have the rectosigmoid involved in the fascial closure from her previous Pfannenstiel incision.  This was released as well.  We then checked for hemostasis; this was deemed complete.  The abdomen was then irrigated with normal saline solution.  We again checked the area of the release of the small bowel; this appeared to be completely viable.  We checked then the position of the NG tube and then returned the intestines to the abdominal cavity, placing the omentum over the intestines, and then closing with figure-of-eight interrupted monofilament through-and-through sutures.  Down towards the Pfannenstiel portion of the incision, this had to be repaired carefully as there was a swiss cheese-type of defect from an incisional hernia in this area.  This was repaired with through-and-through 0 Monocryl sutures.  Subcu was irrigated and the skin was approximated with a stapling device.  Prior to closure, all sponge, needle and instrument counts were found to be correct. Estimated blood  loss was minimal.  The patient received approximately 2 L of crystalloids intraoperatively, no drains were placed and there were no complications. Dictated by:   Barbaraann Barthel, M.D. Attending Physician:  Barbaraann Barthel DD:  07/30/01 TD:  08/02/01 Job: [id number]

## 2011-02-21 NOTE — Discharge Summary (Signed)
NAME:  Sheila Blair, Sheila Blair                         ACCOUNT NO.:  [id number]   MEDICAL RECORD NO.:  [id number]                   PATIENT TYPE:  INP   LOCATION:  A227                                 FACILITY:  [facility]   PHYSICIAN:  Barbaraann Barthel, M.D.              DATE OF BIRTH:  06-09-1936   DATE OF ADMISSION:  05/05/2004  DATE OF DISCHARGE:  05/13/2004                                 DISCHARGE SUMMARY   DISCHARGE DIAGNOSIS:  Small-bowel obstruction.   SECONDARY DIAGNOSES:  1. Hypertension.  2. Hyperlipidemia.  3. Anxiety.   PROCEDURE:  On May 05, 2004, exploratory laparotomy with small-bowel  resection.   CONSULTATIONS:  1. [facility].  2. Hospitalist service.   HOSPITAL COURSE:  This is a 75 year old white female who were admitted  through the emergency room with abdominal pain. She was found to have a  small-bowel obstruction on plain films, and CT scan revealed that this might  likely be a closed loop obstruction. We took her to surgery after the CT  revealed that possibility, and indeed, we found a closed loop obstruction  approximately 8 inches from the ileocecal valve, and an approximately 10-  inch segment of small bowel was resected with a primary side to side stapled  anastomosis. The patient did quite well postoperatively. She did develop  some atrial fibrillation which was treated by the hospitalist and the  cardiologist, and her rate stabilized, and her heart rate was regulated  postoperatively.   Her hospital course otherwise unremarkable. Her Foley and NG tube were  removed on the fourth postoperative day. She did develop her atrial  fibrillation which happened in the perioperative period, and she was treated  in the intensive care unit for this, and she did well. She was transferred  to the second floor and monitored there without any further problems.   Her wound remained clean. She did have some serous drainage from her wound  which ceased and  without any signs of any infection. This is due to the fact  that she has essentially no fascia there and has a mesh that opened and  reapproximated to close her abdominal wall. We will keep the surgical  staples in her for a little longer due to this reason. Her wound remains  clean without any further drainage. Her bowels are moving well, and she is  tolerating p.o. well without any shortness of breath or chest pain or leg  pain and doing quite well.   The rest of her laboratory work, her last EKG showed that she was in normal  sinus rhythm. As stated, her previous CT scans showed the possibility of a  closed loop obstruction which was confirmed and an acute abdominal series  which showed a partial small-bowel obstruction. Pathology revealed a segment  of terminal ileum with bowel wall ischemia.   Rest of laboratory work:  Her white count on May 12, 2004 was  6.9 with a H  and H of 12.1 and 34.5. Her electrolytes were all grossly within normal  limits. Her BUN was 5 and her creatinine 0.6. Urinalysis showed some  bacteria on admission. She was treated perioperatively with antibiotics for  her bowel resection, and this was not a catheterized specimen. She has had  no complaints of dysuria or anything postoperatively.   DISCHARGE INSTRUCTIONS:  She is discharged on Diltiazem 180 mg p.o. b.i.d.  and Lopressor 100 mg p.o. b.i.d. She is told to stop the hydrochlorothiazide  and atenolol and clonidine and Lotensin at present. The rest of  instructions:  She is permitted a full liquid and soft diet. She is to  increase her activities as tolerated. She is told that she may shower. I do  not want her to go into any swimming pools or any Jacuzzis or swimming in  the lake, etc. She is told to clean her wound with alcohol three times a  day. She is restricted from doing driving or heavy lifting or sexual  activity at present. We will follow her perioperatively after which she is  to return to  Dr. Ouida Sills for any problems she may have medically.     ___________________________________________                                         Barbaraann Barthel, M.D.   WB/MEDQ  D:  05/13/2004  T:  05/13/2004  Job:  [id number]   cc:   Kingsley Callander. Ouida Sills, M.D.  [address]  [place]  [place] [address]  Fax: [phone]

## 2011-02-21 NOTE — Op Note (Signed)
NAME:  Sheila Blair, Sheila Blair                         ACCOUNT NO.:  [id number]   MEDICAL RECORD NO.:  [id number]                   PATIENT TYPE:  INP   LOCATION:  A319                                 FACILITY:  [facility]   PHYSICIAN:  Barbaraann Barthel, M.D.              DATE OF BIRTH:  Sep 30, 1936   DATE OF PROCEDURE:  05/05/2004  DATE OF DISCHARGE:                                 OPERATIVE REPORT   PREOPERATIVE DIAGNOSIS:  Small bowel obstruction, closed loop.   POSTOPERATIVE DIAGNOSIS:  Small bowel obstruction, closed loop.  Ischemic  small intestine.   OPERATION PERFORMED:  Exploratory laparotomy, lysis of adhesions, small  bowel resection with primary side-to-side stapled anastomosis, repair of  ventral hernia.   SURGEON:  Barbaraann Barthel, M.D.   ASSISTANT:  Tilda Burrow, M.D.   ANESTHESIA:  General.   INDICATIONS FOR PROCEDURE:  The patient is a 75 year old white female who  came to the emergency room with bloating, nausea and abdominal distention,  who  had a normal white blood cell count with an acute abdominal series that  revealed partial small bowel obstruction.  CT scan was done and this looked  like a closed loop type of obstruction.  She also had hypokalemia.  We  hydrated her and replaced her electrolytes and took her to surgery after  discussing the surgery with the family in detail, discussing complications  not limited to but including bleeding, infection, anastomotic leaks and  informed consent was obtained.   GROSS OPERATIVE FINDINGS:  The patient had approximately 10 inches of small  bowel in the terminal ileum approximately eight inches from the ileocecal  valve that was volvulized with an annular adhesion about it as well that was  ischemic.  This was resected and remained pink and viable during the entire  case.  No other abnormalities were encountered.  The patient had previous  gallbladder surgery, previous hysterectomy and bilateral salpingo-  oophorectomy  and appendectomy.  No other abnormalities were encountered  intraoperatively.   DESCRIPTION OF PROCEDURE:  The patient was placed in supine position. After  adequate administration of general anesthesia which required Dr. Marcos Eke to  perform an oral endoscopically assisted endotracheal intubation, the patient  was placed in the supine position and the abdomen was prepped with Betadine  solution and draped in the usual manner after a Foley catheter was  aseptically inserted.  A midline incision was carried out above her previous  infraumbilical incision as this patient had had a history of mesh being  placed there.  I wanted to enter the abdomen in an area that had been  previously unscarred.  Skin and subcutaneous tissue were incised.  The  abdomen was opened in the midline.  Then with careful dissection, the mesh  was opened below the umbilicus and the abdomen was opened and explored with  the above findings.  Adhesions were taken down using the LDS stapling  device.  There were multiple adhesions around this area and in the pelvis.  All involving the bowel in this area were lysed.  At a distance  approximately eight inches from ileocecal valve, the distal portion of the  small bowel was divided using the GIA stapling device and approximately 10  inches of bowel was involved in this obstruction.  This was proximally  likewise divided using the GIA stapler proximally.  We then used a GIA  stapler to perform a side-to-side anastomosis after placing a 3-0 GI silk  suture distally as a stay suture and closing the anastomosis with a TA-55  stapler. We then changed gloves, irrigated, approximated the mesentery which  had been divided and ligated with 2-0 silk sutures and closed the mesentery  with 3-0 PDS.  After irrigation, we then approximated the mesh that had been  open with interrupted 0 Novofil or Prolene sutures and then approximated  with the skin with a stapling device.  The  subcutaneous tissue was irrigated  and the skin was approximated with a stapling device.  No drains were  placed.  Prior to closure, all sponge, needle and instrument counts were  found to be correct.  The estimated blood loss was approximately 150 mL or  less.  The patient received 2700 mL of crystalloid intraoperatively.  There  were no complications.  She was extubated without a problem and taken to the  intensive care unit to react and to follow there.  I will have the  hospitalist see her perioperatively as he saw her preoperatively and also  notify Dr. Ouida Sills of her admission.      ___________________________________________                                            Barbaraann Barthel, M.D.   WB/MEDQ  D:  05/05/2004  T:  05/06/2004  Job:  [id number]   cc:   Delbert Harness, MD   Kingsley Callander. Ouida Sills, M.D.  [address]  [place]  [place] [address]  Fax: [phone]

## 2011-02-21 NOTE — Consult Note (Signed)
[facility]  Patient:    Sheila Blair, Sheila Blair Visit Number: [id number] MRN: [id number]          Service Type: OBV Location: 3A A310 01 Attending Physician:  Annamarie Dawley Dictated by:   Barbaraann Barthel, M.D. Proc. Date: 07/28/01 Admit Date:  07/28/2001   CC:         Dr. Tedra Senegal, M.D.                          Consultation Report  Surgery was asked to see this 75 year old white female for abdominal pain.  CHIEF COMPLAINT:  Crampy abdominal pain with nausea and vomiting and hypotension on admission to the emergency room.  HISTORY OF PRESENT ILLNESS:  The patient approximately 23 days previously had undergone an extensive pelvic reconstruction surgery at [facility] which required a procedure that according to her daughter who is a nurse at least six hours or so.  This involved a burch procedure and a placement of a mesh to use as a colpopexy for vaginal prolapse.  This surgery was complicated by readmission to [facility] for an ileus and since that time she has had improvement until this afternoon at approximately 3:00 where she developed some crampy abdominal pain.  She tried to induce vomiting herself and she tried to force having a bowel movement.  The crampy pain continued and she came to the emergency room where she was agitated and vomiting and complaining of abdominal pain.  She had a small bowel movement today but she states that she had a normal bowel movement yesterday.  PHYSICAL EXAMINATION  GENERAL:  Agitated and uncomfortable appearing 75 year old white female.  VITAL SIGNS:  Weight 160 pounds, 5 feet 8 inches, blood pressure 77/48 (with IV hydration now 140/70), heart rate approximately 70 per minute, O2 saturation 100%, temperature 97.0, respirations 20 per minute.  HEENT:  Head is normocephalic.  Eyes:  Extraocular movements are intact. Pupils are round and react to light and accommodation.  There is no conjunctival pallor  or scleral injection.  Sclerae is normal tincture.  Nose and oral mucosa are moist.  There is no discharge from the ears or complaint of loss of hearing.  NECK:  Supple and cylindrical.  Jugular veins are flat.  There is no thyromegaly and no cervical adenopathy.  The patient has a transverse incision consistent with previous thyroid surgery.  CHEST:  Clear both to anterior and posterior auscultation.  HEART:  Regular rhythm.  BREASTS:  Without masses.  AXILLAE:  Without masses.  ABDOMEN:  Soft.  Bowel sounds are hypoactive, but present.  The patient has a non-infected Pfannenstiel incision that still has Steri-Strips over it.  This is not infected.  There are no femoral or inguinal hernias appreciated.  PELVIC:  There is no bleeding from the vagina.  Urine is slightly cloudy yellow and we had no trouble placing a Foley catheter.  RECTAL:  There is some grainy stool in the rectal ampulla which is guaiac negative.  EXTREMITIES:  Pulses are full and symmetrical.  Previous varicosity surgery performed.  There is no pedal edema and no cyanosis.  REVIEW OF SYSTEMS:  CARDIORESPIRATORY:  The patient has had no previous history of a myocardial infarction.  She states that she has had some vague epigastric pain, but no chest pain per se.  She is a nonsmoker and a nondrinker.  She is  hypertensive.  She takes atenolol and Lotrel. GASTROINTESTINAL:  The patient has nausea, vomiting, and crampy abdominal pain.  The vomitus is yellowish in nature.  She has undergone a previous cholecystectomy in the past as well as an appendectomy.  The patient underwent total colonoscopy with a polypectomy performed in March 2001.  Patient has had a normal formed bowel movement yesterday.  She has complained of some crampy abdominal pain and the desire to have a bowel movement today with very little gas passed today.  OB/GYN:  Patient is a gravida 2, para 2, cesarean 0, abortus 0 female who has undergone  several pelvic surgeries which have included an ovarian cystectomy and later an oophorectomy followed by a total abdominal hysterectomy and an oophorectomy.  She has recently undergone a repair of a vaginal prolapse with mesh and a burch bladder pexy.  ENDOCRINE: Past history of a partial thyroidectomy.  Details are not known.  No past history of diabetes mellitus.  MUSCULOSKELETAL:  Grossly within normal limits.  PAST SURGERIES: 1. Partial thyroidectomy. 2. Laparoscopic cholecystectomy. 3. Total abdominal hysterectomy and bilateral salpingo-oophorectomy at various    staged procedures with an incidental appendectomy and recently, as well, a    bladder pexy with reconstruction of the perineal floor with the use of    mesh. 4. She has also undergone varicose vein stripping procedures.  MEDICATIONS: 1. Lorazepam 1 mg q.6h. p.r.n. 2. Paxil 20 mg p.o. q.d. for anxiety. 3. Ditropan, discontinued. 4. Macrodantin, discontinued. 5. Pyridium, discontinued. 6. Lotrel. 7. Atenolol.  She does not know the doses that she is taking.  ALLERGIES:  MORPHINE, CODEINE.  LABORATORIES:  Patients 12-lead electrocardiogram shows normal sinus rhythm with nonspecific anterolateral T-wave abnormalities, but grossly within normal limits.  This was reviewed with the emergency room physician.  I did not see any obvious problems there.  Acute abdominal series shows what appears to be an ileus pattern.  There is no free air.  Chest x-ray looks within normal limits.  White count 8.2, H&H 10.8, 31.0, neutrophils 56.  Clotting studies:  Protime and INR and PTT are not elevated.  Metabolic 7:  Sodium 133, potassium 3.4, chloride 102, CO2 27, glucose 162, BUN 14, creatinine 1.0.  All liver function studies are within normal limits.  Amylase and lipase are not elevated. Cardiac enzymes are pending at the time of this dictation.  IMPRESSION: 1. Status post pelvic surgery at [facility] and likely postoperative  adhesions or     partial small bowel obstruction. 2. Status post partial thyroidectomy. 3. Anxiety. 4. Hypertension.  PLAN:  I will admit and hydrate and correct electrolyte imbalance, monitor Is and Os.  Will have Dr. Ouida Sills follow her medically and repeat the acute abdominal series in the morning.  Continue NG suction.  I believe that this is postoperative adhesions or partial small bowel obstruction type of picture which should hopefully resolve with non-operative approach.  I have discussed the findings with the OB/GYN fellow at [facility] and if need be we will return this postoperative patient to [facility] if no improvement.  While she is here I will have Dr. Ouida Sills who is her regular doctor see her as well. Dictated by:   Barbaraann Barthel, M.D. Attending Physician:  Annamarie Dawley DD:  07/28/01 TD:  07/29/01 Job: [id number]

## 2011-02-21 NOTE — H&P (Signed)
NAME:  Sheila Blair, Sheila Blair                         ACCOUNT NO.:  [id number]   MEDICAL RECORD NO.:  [id number]                   PATIENT TYPE:  INP   LOCATION:  A319                                 FACILITY:  [facility]   PHYSICIAN:  Melvyn Novas, MD        DATE OF BIRTH:  1936-06-22   DATE OF ADMISSION:  05/05/2004  DATE OF DISCHARGE:                                HISTORY & PHYSICAL   The patient is a 75 year old white female patient of Dr. Alonza Smoker, who is on  vacation, who complains of diffuse, severe abdominal cramping and pressure-  like pain in entire both lower quadrants of abdomen.  She did admit to some  nausea but specifically denied any vomiting, hematemesis.  She has denied  any melena or hematochezia.  The pain persisted all night.  She did eat a  fair amount of sour cream in casseroles and consideration is given to a  degree of lactose intolerance.  However, small bowel obstruction was  diagnosed on small bowel series in the ER, and she is admitted for abdominal  CAT scan and NPO.  In addition, she was hypokalemic with a potassium of 3.1.  She had a history of small bowel obstruction with surgical intervention  several years ago.   PAST MEDICAL HISTORY:  1. Hypertension.  2. History of ileus.  3. History of small bowel obstruction.  4. Hyperlipidemia.  5. Chronic cephalgia.   PAST SURGICAL HISTORY:  1. Appendectomy.  2. TAH/BSO.  3. Cholecystectomy in 1993.  4. Ventral hernia repair in 2004.  5. Thyroid nodule removed in 1966.  6. Pelvic bladder repair in 2002.  7. Surgery for adhesions in 2002 and right leg vein stripping in 1966.   SOCIAL HISTORY:  She is a nonsmoker, retired Runner, broadcasting/film/video, married.   CURRENT MEDICINES:  1. Lotrel 5/20 daily.  2. Atenolol 50 b.i.d.  3. Amitriptyline 40 h.s.  4. Niacin 500 2 tabs q.h.s.  5. Clonidine, unknown dosage b.i.d.  6. Colace 100 mg daily.  7. Hydrochlorothiazide 25 mg daily.   PHYSICAL EXAMINATION:  GENERAL:  She  is alert and oriented x 3, in no  significant distress.  EYES:  PERRLA.  Extraocular movements intact.  Sclerae clear.  Conjunctivae  pink.  NECK:  No jugular venous distension.  No carotid bruits, no thyromegaly, no  thyroid bruits.  LUNGS:  Clear to A&P.  No rales, wheeze, or rhonchi appreciable.  HEART:  Regular rhythm, S1 and S2 of normal intensity.  No S3 or S4 gallops.  No heaves, thrills, or rubs appreciable.  ABDOMEN:  Soft, nontender.  Bowel sounds are normoactive.  No guarding, no  rebound.  No peristaltic rushes auscultated by me.  Her stool was heme-  negative in the ER.  EXTREMITIES:  No cyanosis, clubbing, or edema.  NEUROLOGIC:  Cranial nerves 2-12 are grossly intact.  The patient moves all  4 extremities.  VITAL SIGNS:  138/78 in the ER, afebrile.  Respiratory rate was 20.   IMPRESSION:  1. Small bowel obstruction.  2. Hypokalemia with a potassium of 3.1.  3. Hypertension.  4. Suspect lactose intolerance.  5. Urinary tract infection with many bacteria.   PLAN:  1. The plan right now is to obtain surgical consult and CAT scan with p.o.     and IV contrast.  2. Dilaudid 1 mg IV q.3h. p.r.n. for pain.  3. Phenergan 12.5 mg IV q.4h. for nausea.  4. Monitor serial electrolytes.  5. I will make further recommendations as the data base expands.     ___________________________________________                                         Melvyn Novas, MD   RMD/MEDQ  D:  05/05/2004  T:  05/05/2004  Job:  [id number]

## 2011-02-21 NOTE — Procedures (Signed)
NAME:  Blair, Sheila NO.:  [id number]   MEDICAL RECORD NO.:  [id number]                   PATIENT TYPE:  INP   LOCATION:  A227                                 FACILITY:  [facility]   PHYSICIAN:  Nicki Guadalajara, M.D.                  DATE OF BIRTH:  1936/01/08   DATE OF PROCEDURE:  DATE OF DISCHARGE:                                  ECHOCARDIOGRAM   INDICATIONS:  Ms. Maddyn Lieurance is a 75 year old female patient of Dr.  Malvin Johns who has a history of hypertension and paroxysmal atrial  fibrillation.  The patient developed postoperative atrial fibrillation  following recent valve surgery.  She is referred for a 2D echo Doppler  study.   1. Technically this is an adequate echo two dimensional and comprehensive     Doppler echocardiogram.  2. There is mild concentric left ventricular hypertrophy with wall thickness     measuring 1.2 septally and 1.1 cm posteriorly.  Left ventricular end-     diastolic and end-systolic dimensions were normal at 4.6 and 3.4 cm     respectively.  Systolic function was normal.  Estimated ejection fraction     is approximately 55%.  There were no focal segmental wall motion     abnormalities.  3. There is moderate left atrial enlargement at 4.7 cm.  The right atrium     was upper normal in size.  The right ventricle appeared upper normal in     size with normal contractility.  4. The aortic root dimension was normal, 3.5 cm.  5. The aortic valve was trileaflet and delicate.  There was minimal     thickening.  There was no stenosis or regurgitation.  6. There was mild bowing/flat closure of the anterior mitral valve leaflet     without frank mitral valve prolapse.  There was mild mitral     regurgitation.  7. The tricuspid valve was structurally normal.  There was trivial tricuspid     regurgitation.  8. The pulmonic valve was not well visualized.  9. There were no intramyocardial masses.  There was a small anterior fat      pad.   IMPRESSION:  Technically this is an adequate echo Doppler study  demonstrating mild concentric left ventricular hypertrophy with normal left  ventricular systolic function.  There is moderate left atrial enlargement  with flat  closure to the mitral valve without frank prolapse, but evidence for mild  mitral regurgitation.  There was minimal aortic valve thickening without  stenosis.  There was trivial tricuspid regurgitation.  A small anterior fat  pad is present.      ___________________________________________                                            Maisie Fus  Tresa Endo, M.D.   TK/MEDQ  D:  05/09/2004  T:  05/09/2004  Job:  [id number]   cc:   Barbaraann Barthel, M.D.  [address]  [place]  [place] [address]  Fax: [phone]   Nicki Guadalajara, M.D.  [id number], [address]  [place], [place] [address]  Fax: [phone]   Orvan Falconer, Dr.   Dani Gobble, MD  Fax: [phone]

## 2011-02-21 NOTE — Op Note (Signed)
Sheila Blair, Sheila Blair               ACCOUNT NO.:  [id number]   MEDICAL RECORD NO.:  [id number]          PATIENT TYPE:  AMB   LOCATION:  DAY                           FACILITY:  [facility]   PHYSICIAN:  Lionel December, M.D.    DATE OF BIRTH:  1935/11/17   DATE OF PROCEDURE:  01/28/2005  DATE OF DISCHARGE:                                 OPERATIVE REPORT   PROCEDURE:  Colonoscopy.   INDICATIONS:  Sheila Blair is a 75 year old Caucasian female who is here for  screening colonoscopy. Family history is negative for colorectal carcinoma.  She has had multiple abdominal surgeries for lysis of adhesions. Last one  was at [facility] via laparoscopy in November 2005, and she has done well since  then.   Procedure risks were reviewed with the patient, and informed consent was  obtained.   PREOPERATIVE MEDICATIONS:  Demerol 50 mg IV, Versed 6 mg IV in divided dose.   FINDINGS:  Procedure performed in endoscopy suite. The patient's vital signs  and O2 saturation were monitored during the procedure remained stable. The  patient was placed in left lateral position and rectal examination  performed. No abnormality noted on external or digital exam. Olympus  videoscope was placed in rectum and advanced under vision into sigmoid colon  and beyond. Preparation was excellent. There was a single diverticulum at  sigmoid colon. She formed a tight loop in the sigmoid colon. Once the scope  was passed to the splenic flexure, I was able to reduce slope completely.  Scope was then advanced to cecum which was identified by appendiceal  stump/orifice and ileocecal valve. Pictures taken for the record. Colonic  mucosa was examined for the second time on the way out and was normal  throughout. Rectal mucosa similarly was normal. Scope was retroflexed to  examine anorectal junction which was unremarkable. Endoscope was then  withdrawn. The patient tolerated procedure well.   FINAL DIAGNOSIS:  Single diverticulum at sigmoid colon,  otherwise normal  examination.   RECOMMENDATIONS:  1.  She will resume her usual diet and medications.  2.  Yearly Hemoccults. She may consider next screening in 10 years from now.      NR/MEDQ  D:  01/28/2005  T:  01/28/2005  Job:  [id number]   cc:   Kingsley Callander. Ouida Sills, MD  [address]  [place]  [place] [address]  Fax: [phone]   Lily Peer, M.D.  Dept of Surgery  [facility]

## 2011-02-28 ENCOUNTER — Emergency Department (HOSPITAL_COMMUNITY)
Admission: EM | Admit: 2011-02-28 | Discharge: 2011-03-01 | Disposition: A | Discharge status: Home / Self Care | Payer: Medicare Other | Attending: Emergency Medicine | Admitting: Emergency Medicine

## 2011-02-28 DIAGNOSIS — F411 Generalized anxiety disorder: Secondary | ICD-10-CM | POA: Insufficient documentation

## 2011-02-28 DIAGNOSIS — F3289 Other specified depressive episodes: Secondary | ICD-10-CM | POA: Insufficient documentation

## 2011-02-28 DIAGNOSIS — K7689 Other specified diseases of liver: Secondary | ICD-10-CM | POA: Insufficient documentation

## 2011-02-28 DIAGNOSIS — Z853 Personal history of malignant neoplasm of breast: Secondary | ICD-10-CM | POA: Insufficient documentation

## 2011-02-28 DIAGNOSIS — F329 Major depressive disorder, single episode, unspecified: Secondary | ICD-10-CM | POA: Insufficient documentation

## 2011-02-28 DIAGNOSIS — E860 Dehydration: Secondary | ICD-10-CM | POA: Insufficient documentation

## 2011-02-28 DIAGNOSIS — Z79899 Other long term (current) drug therapy: Secondary | ICD-10-CM | POA: Insufficient documentation

## 2011-02-28 DIAGNOSIS — R110 Nausea: Secondary | ICD-10-CM | POA: Insufficient documentation

## 2011-02-28 DIAGNOSIS — Q619 Cystic kidney disease, unspecified: Secondary | ICD-10-CM | POA: Insufficient documentation

## 2011-02-28 DIAGNOSIS — E785 Hyperlipidemia, unspecified: Secondary | ICD-10-CM | POA: Insufficient documentation

## 2011-02-28 DIAGNOSIS — I10 Essential (primary) hypertension: Secondary | ICD-10-CM | POA: Insufficient documentation

## 2011-02-28 DIAGNOSIS — R109 Unspecified abdominal pain: Secondary | ICD-10-CM | POA: Insufficient documentation

## 2011-03-01 ENCOUNTER — Emergency Department (HOSPITAL_COMMUNITY): Payer: Medicare Other

## 2011-03-01 LAB — LIPASE, BLOOD: Lipase: 22 U/L (ref 11–59)

## 2011-03-01 MED ORDER — IOHEXOL 300 MG/ML  SOLN
100.0000 mL | Freq: Once | INTRAMUSCULAR | Status: AC | PRN
  Administered 2011-03-01: 100 mL via INTRAVENOUS

## 2011-03-03 NOTE — Op Note (Signed)
  NAMESHELBA, SUSI               ACCOUNT NO.:  [id number]  MEDICAL RECORD NO.:  [id number]           PATIENT TYPE:  O  LOCATION:  DAYP                          FACILITY:  [facility]  PHYSICIAN:  Lionel December, M.D.    DATE OF BIRTH:  [birth year]  DATE OF PROCEDURE:  02/21/2011 DATE OF DISCHARGE:                              OPERATIVE REPORT   PROCEDURE:  Colonoscopy.  INDICATIONS:  Sheila Blair is 75 year old Caucasian female who has history of colonic polyps and undergoing surveillance exam.  Her last exam in April 2006, for about 6 years ago.  Procedures were reviewed with the patient. Informed consent was obtained.  MEDS FOR CONSCIOUS SEDATION: 1. Demerol 50 mg IV. 2. Versed 5 mg IV.  FINDINGS:  Procedure performed in endoscopy suite.  The patient's vital signs and O2 sat were monitored during the procedure and remained stable.  The patient was placed in left lateral recumbent position. Rectal examination performed.  No abnormality noted on external or digital exam.  Pentax videoscope was placed in the rectum and advanced under vision into sigmoid colon and beyond.  Preparation was excellent. She had a single small diverticulum at sigmoid colon, other one hepatic flexure.  Scope was passed into cecum which was identified by appendiceal orifice and ileocecal valve.  As the scope was withdrawn, colonic mucosa was carefully examined.  There was two small polyps at hepatic flexure located side by side and these were ablated via cold biopsy and submitted in one container.  There was another 3-mm polyp at splenic flexure which was ablated in similar fashion.  Mucosa and rest of the colon was normal.  Rectal mucosa similarly was normal.  Scope was retroflexed to examine anorectal junction and anorectal junction was unremarkable.  Endoscope was then withdrawn.  Withdrawal time was 20 minutes.  The patient tolerated the procedure well.  FINAL DIAGNOSES: 1. Examination performed to cecum. 2.  Two small diverticula at sigmoid another one at hepatic flexure. 3. Three small polyps ablated via cold biopsy, two from hepatic     flexure was submitted in one container and the other one was from     splenic flexure.  RECOMMENDATIONS: 1. Standard instructions given. 2. I will be contacting the patient with results of biopsy and further     recommendations.          ______________________________ Lionel December, M.D.     NR/MEDQ  D:  02/21/2011  T:  02/21/2011  Job:  [id number]  cc:   Kingsley Callander. Ouida Sills, MD Fax: [phone]  Electronically Signed by Lionel December M.D. on 03/03/2011 86:57:84 PM

## 2011-03-03 NOTE — Consult Note (Addendum)
[name], [name]               ACCOUNT NO.:  [id number]  MEDICAL RECORD NO.:  [id number]           PATIENT TYPE:  LOCATION:                                 FACILITY:  PHYSICIAN:  Lionel December, M.D.    DATE OF BIRTH:  [birth year]  DATE OF CONSULTATION:  02/11/2011 DATE OF DISCHARGE:                                CONSULTATION   PRESENTING COMPLAINT:  History of colonic polyps.  The patient interested in colonoscopy.  HISTORY OF PRESENT ILLNESS:  Sheila Blair is a 75 year old Caucasian female who is referred through courtesy of Dr. Ouida Sills for GI evaluation.  She has history of colonic polyps.  She states her last exam was due last year. She had her initial colonoscopy in March 2001 and had a 10-mm tubular adenoma removed from her splenic flexure.  She had cecal diverticulum along with 2 diverticula at sigmoid colon.  Her second colonoscopy was in April 2006 and was unremarkable other than sigmoid diverticulum.  She states presently, she is not having any GI problems.  She has a very good appetite.  Her weight has been stable.  She has been trying to lose weight but without any success.  Her bowels move daily.  She denies melena or rectal bleeding.  She also denies heartburn, dysphagia, nausea, or vomiting.  She does complain of what she states as twinges in her abdomen.  This symptom last only couple of seconds each time and not associated with any other symptoms.  CURRENT MEDICATIONS: 1. Diltiazem 180 mg twice daily. 2. Citalopram 40 mg p.o. daily. 3. Metoprolol 100 mg p.o. b.i.d. 4. Losartan 100 mg p.o. daily. 5. MVI daily. 6. Colace 100 mg twice daily. 7. Camomile 2 one twice daily. 8. Fish oil 500 mg b.i.d. 9. Os-Cal with D 600 mg b.i.d. 10.Pradaxa 150 mg p.o. b.i.d. 11.Atorvastatin 10 mg daily.  PAST MEDICAL HISTORY:  She has been hypertensive for about 20 years. She has history of left breast carcinoma diagnosed about 3 years ago. She had lumpectomy followed by chemo  and radiation and has remained in remission.  She has osteoarthrosis primarily involving her knee and hip joint.  Atrial fibrillation was diagnosed 2 years ago.  She had a cardioversion, but it did not work.  She previously was on Coumadin, now converted to Pradaxa.  History of stress disorder.  She has a right entropion, scheduled to have surgery but decided hold off.  History of colonic polyp as above with most recent colonoscopy in April 2006.  She had surgery on her right lower extremity for varicose vein in 1968. She had hysterectomy in 1985.  She had thyroid surgery with removal of a nodule in 1966, it was a benign nodule.  She had benign ovarian tumor removed back in 1965 prior to her hysterectomy.  She had cholecystectomy in 1993.  She had pelvic and bladder surgery for tacking of bladder and few weeks later, she developed bowel obstruction secondary to adhesions. She had 2 surgeries in 2005.  She had laparoscopy in 2006 and her past bout with small-bowel obstruction responded to conservative therapy. She had repair of ventral hernia  twice in 2004.  Finally, she has had right cataract extraction in 2005.  ALLERGIES:  CODEINE and MORPHINE resulting in chest pressure and breathing difficulty.  FAMILY HISTORY:  Mother had arthritis and CHF, lived to be 78.  Father died at 71 following a hip fracture and developed pneumonia.  She had one sister who passed away 3 years ago at age 61 because of vascular dementia.  SOCIAL HISTORY:  She has been married for 52 years.  She has 2 daughters and they are both in good health.  She is a retired Chartered loss adjuster.  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  She has never smoked cigarettes and she drinks glass of wine with evening meal.  OBJECTIVE:  VITAL SIGNS:  Weight 189 pounds, she is 68 inches tall, pulse 76 per minute, blood pressure 120/80, and temperature is 98.1. HEENT:  Conjunctivae are pink.  Sclerae are nonicteric.   Oropharyngeal mucosa is normal. NECK:  No neck masses or thyromegaly noted. CARDIAC:  Regular rhythm.  Normal S1 and S2.  No murmur or gallop noted. LUNGS:  Clear to auscultation. ABDOMEN:  Full.  Bowel sounds are normal.  A well-healed low midline scar.  On palpation, soft abdomen without tenderness, organomegaly, or masses. RECTAL:  Deferred. EXTREMITIES:  No peripheral edema or clubbing noted.  ASSESSMENT:  [name] is a 75 year old Caucasian female who had 10-mm tubular adenoma removed from hercolon back in 2001 and her last colonoscopy was in April 2006.  While family history is negative for colorectal carcinoma, personal history is positive for breast carcinoma and she remains in remission.  She has had multiple bowel surgeries for adhesions, but lately has done very well.  RECOMMENDATIONS:  Surveillance colonoscopy to be performed at [facility] in the future.  She will hold off her Pradaxa for 2 days.  I have reviewed the procedure risks with the patient and she is agreeable.  We appreciate the opportunity to participate in the care of this nice lady.          ______________________________ Lionel December, M.D.     NR/MEDQ  D:  02/11/2011  T:  02/12/2011  Job:  [id number]  cc:   Kingsley Callander. Ouida Sills, MD Fax: [phone]  Electronically Signed by Lionel December M.D. on 03/03/2011 06:11:46 PM

## 2011-03-04 LAB — DIFFERENTIAL
Basophils Relative: 0 % (ref 0–1)
Eosinophils Absolute: 0 10*3/uL (ref 0.0–0.7)
Eosinophils Relative: 0 % (ref 0–5)
Lymphs Abs: 0.7 10*3/uL (ref 0.7–4.0)
Monocytes Absolute: 0.7 10*3/uL (ref 0.1–1.0)

## 2011-03-04 LAB — CBC
MCH: 31.9 pg (ref 26.0–34.0)
MCV: 93.2 fL (ref 78.0–100.0)
Platelets: 292 10*3/uL (ref 150–400)
RDW: 12.9 % (ref 11.5–15.5)

## 2011-03-04 LAB — COMPREHENSIVE METABOLIC PANEL
ALT: 26 U/L (ref 0–35)
AST: 31 U/L (ref 0–37)
Albumin: 4.5 g/dL (ref 3.5–5.2)
Alkaline Phosphatase: 94 U/L (ref 39–117)
Chloride: 88 mEq/L — ABNORMAL LOW (ref 96–112)
GFR calc Af Amer: 39 mL/min — ABNORMAL LOW (ref >60)
Potassium: 3.9 mEq/L (ref 3.5–5.1)
Sodium: 133 mEq/L — ABNORMAL LOW (ref 135–145)
Total Bilirubin: 0.7 mg/dL (ref 0.3–1.2)

## 2011-03-04 LAB — TROPONIN I: Troponin I: <0.3 ng/mL (ref <0.30)

## 2011-05-20 ENCOUNTER — Other Ambulatory Visit: Payer: Self-pay | Admitting: Physician Assistant

## 2011-06-26 LAB — BASIC METABOLIC PANEL
BUN: 11
BUN: 7
CO2: 27
Chloride: 105
Chloride: 99
Creatinine, Ser: 0.46
Creatinine, Ser: 0.7
Potassium: 3.5

## 2011-06-26 LAB — HEPATIC FUNCTION PANEL
ALT: 62 — ABNORMAL HIGH
AST: 67 — ABNORMAL HIGH
Albumin: 3.5
Alkaline Phosphatase: 73
Alkaline Phosphatase: 76
Indirect Bilirubin: 0.3
Indirect Bilirubin: 0.5
Total Bilirubin: 0.6
Total Protein: 6.6

## 2011-06-26 LAB — CBC
MCHC: 34
MCV: 93.3
Platelets: 345
WBC: 6.3

## 2011-06-26 LAB — URINALYSIS, ROUTINE W REFLEX MICROSCOPIC
Bilirubin Urine: NEGATIVE
Ketones, ur: NEGATIVE
Nitrite: NEGATIVE
Protein, ur: NEGATIVE
Urobilinogen, UA: 0.2

## 2011-06-26 LAB — DIFFERENTIAL
Basophils Absolute: 0
Basophils Relative: 0
Eosinophils Absolute: 0.3
Lymphs Abs: 1.4
Neutrophils Relative %: 66

## 2011-06-26 LAB — T4, FREE: Free T4: 1.16

## 2011-06-26 LAB — TSH: TSH: 5.044

## 2011-06-26 LAB — MAGNESIUM: Magnesium: 1.9

## 2011-06-26 LAB — RAPID URINE DRUG SCREEN, HOSP PERFORMED
Amphetamines: NOT DETECTED
Cocaine: NOT DETECTED
Opiates: NOT DETECTED
Tetrahydrocannabinol: NOT DETECTED

## 2011-06-30 LAB — DIFFERENTIAL
Basophils Absolute: 0
Eosinophils Absolute: 0
Lymphs Abs: 0.9
Monocytes Absolute: 0.1
Neutro Abs: 0.4 — ABNORMAL LOW

## 2011-06-30 LAB — CBC
HCT: 35.2 — ABNORMAL LOW
MCHC: 35.4
MCV: 90
Platelets: 311
RDW: 12.3

## 2011-07-01 LAB — BASIC METABOLIC PANEL
CO2: 24
Calcium: 8.8
Chloride: 101
Creatinine, Ser: 0.8
GFR calc Af Amer: >60
Sodium: 136

## 2011-07-01 LAB — DIFFERENTIAL
Basophils Absolute: 0
Lymphocytes Relative: 4 — ABNORMAL LOW
Lymphs Abs: 0.6 — ABNORMAL LOW
Neutrophils Relative %: 91 — ABNORMAL HIGH

## 2011-07-01 LAB — CBC
Platelets: 343
RDW: 13.5
WBC: 13.1 — ABNORMAL HIGH

## 2012-01-15 ENCOUNTER — Ambulatory Visit (HOSPITAL_COMMUNITY)
Admission: RE | Admit: 2012-01-15 | Discharge: 2012-01-15 | Disposition: A | Discharge status: Home / Self Care | Payer: Medicare Other | Source: Ambulatory Visit | Attending: Internal Medicine | Admitting: Internal Medicine

## 2012-01-15 ENCOUNTER — Other Ambulatory Visit (HOSPITAL_COMMUNITY): Payer: Self-pay | Admitting: Internal Medicine

## 2012-01-15 DIAGNOSIS — M25551 Pain in right hip: Secondary | ICD-10-CM

## 2012-01-15 DIAGNOSIS — M25559 Pain in unspecified hip: Secondary | ICD-10-CM | POA: Insufficient documentation

## 2012-01-15 DIAGNOSIS — M898X9 Other specified disorders of bone, unspecified site: Secondary | ICD-10-CM | POA: Insufficient documentation

## 2013-02-23 ENCOUNTER — Ambulatory Visit (INDEPENDENT_AMBULATORY_CARE_PROVIDER_SITE_OTHER): Payer: Medicare Other | Admitting: Orthopedic Surgery

## 2013-02-23 VITALS — BP 126/88 | Ht 68.0 in | Wt 194.0 lb

## 2013-02-23 DIAGNOSIS — M1610 Unilateral primary osteoarthritis, unspecified hip: Secondary | ICD-10-CM

## 2013-02-23 NOTE — Patient Instructions (Addendum)
Total Hip Replacement Total hip replacement is the replacement of your damaged hip with an artificial hip joint (prosthetic hip joint). The purpose of this surgery is to reduce pain and improve your hip function. LET YOUR CAREGIVER KNOW ABOUT:   Any allergies you have.  Any medicines you are taking, including vitamins, herbs, eyedrops, over-the-counter medicines, and creams.  Any problems you have had with the use of anesthetics.  Family history of problems with the use of anesthetics.  Any blood disorders you have, including bleeding problems or clotting problems.  Previous surgeries you have had. RISKS AND COMPLICATIONS Generally, total hip replacement is a safe procedure. However, as with any surgical procedure, complications can occur. Complications associated with total hip replacement both during and after the procedure include:  Infection.  Dislocation (the ball of the hip-joint prosthesis comes out of contact with the socket).  Loosening of the stem connected to the ball or socket.  Fracture of the bone while inserting the prosthesis.  Formation of blood clots, which can break loose and travel to and injure your lungs (pulmonary embolus). BEFORE THE PROCEDURE   Your caregiver will instruct you when you need to stop eating and drinking.  Ask your caregiver if you need to change or stop any regular medicines. PROCEDURE Just before the procedure, you will receive medicine that will make you drowsy (sedative) or medicine to make you fall asleep (general anesthetic). This will be given through a tube that is inserted into one of your veins (intravenous [IV] tube). Then you will receive medicine to block pain from the waist down through your legs (spinal block). An incision is made in your hip. Your surgeon will take out any damaged cartilage and bone. Next, your surgeon will insert a prosthetic socket into your pelvic bone. This is usually secured with screws. Then, your surgeon  will cut off the ball of your thigh bone (femur) and attach a prosthetic ball on a stem to your femur. The surgeon then places the ball into the socket and checks the range of motion of your new hip. AFTER THE PROCEDURE  You will be taken to the recovery area where a nurse will watch and check your progress. Once you are awake and stable, you will be taken to a hospital room. You will receive physical therapy until you are doing well and your caregiver feels it is safe for you to go home. Typically, you will stay in the hospital 1 4 days after your procedure. Document Released: 12/29/2000 Document Revised: 03/23/2012 Document Reviewed: 11/09/2011 [facility] Patient Information 2014 [place], [place]. Total Hip Replacement Total hip replacement is the replacement of your damaged hip with an artificial hip joint (prosthetic hip joint). The purpose of this surgery is to reduce pain and improve your hip function. LET YOUR CAREGIVER KNOW ABOUT:   Any allergies you have.  Any medicines you are taking, including vitamins, herbs, eyedrops, over-the-counter medicines, and creams.  Any problems you have had with the use of anesthetics.  Family history of problems with the use of anesthetics.  Any blood disorders you have, including bleeding problems or clotting problems.  Previous surgeries you have had. RISKS AND COMPLICATIONS Generally, total hip replacement is a safe procedure. However, as with any surgical procedure, complications can occur. Complications associated with total hip replacement both during and after the procedure include:  Infection.  Dislocation (the ball of the hip-joint prosthesis comes out of contact with the socket).  Loosening of the stem connected to the ball  or socket.  Fracture of the bone while inserting the prosthesis.  Formation of blood clots, which can break loose and travel to and injure your lungs (pulmonary embolus). BEFORE THE PROCEDURE   Your caregiver will  instruct you when you need to stop eating and drinking.  Ask your caregiver if you need to change or stop any regular medicines. PROCEDURE Just before the procedure, you will receive medicine that will make you drowsy (sedative) or medicine to make you fall asleep (general anesthetic). This will be given through a tube that is inserted into one of your veins (intravenous [IV] tube). Then you will receive medicine to block pain from the waist down through your legs (spinal block). An incision is made in your hip. Your surgeon will take out any damaged cartilage and bone. Next, your surgeon will insert a prosthetic socket into your pelvic bone. This is usually secured with screws. Then, your surgeon will cut off the ball of your thigh bone (femur) and attach a prosthetic ball on a stem to your femur. The surgeon then places the ball into the socket and checks the range of motion of your new hip. AFTER THE PROCEDURE  You will be taken to the recovery area where a nurse will watch and check your progress. Once you are awake and stable, you will be taken to a hospital room. You will receive physical therapy until you are doing well and your caregiver feels it is safe for you to go home. Typically, you will stay in the hospital 1 4 days after your procedure. Document Released: 12/29/2000 Document Revised: 03/23/2012 Document Reviewed: 11/09/2011 [facility] Patient Information 2014 [place], [place]. Total Hip Replacement Total hip replacement is the replacement of your damaged hip with an artificial hip joint (prosthetic hip joint). The purpose of this surgery is to reduce pain and improve your hip function. LET YOUR CAREGIVER KNOW ABOUT:   Any allergies you have.  Any medicines you are taking, including vitamins, herbs, eyedrops, over-the-counter medicines, and creams.  Any problems you have had with the use of anesthetics.  Family history of problems with the use of anesthetics.  Any blood disorders  you have, including bleeding problems or clotting problems.  Previous surgeries you have had. RISKS AND COMPLICATIONS Generally, total hip replacement is a safe procedure. However, as with any surgical procedure, complications can occur. Complications associated with total hip replacement both during and after the procedure include:  Infection.  Dislocation (the ball of the hip-joint prosthesis comes out of contact with the socket).  Loosening of the stem connected to the ball or socket.  Fracture of the bone while inserting the prosthesis.  Formation of blood clots, which can break loose and travel to and injure your lungs (pulmonary embolus). BEFORE THE PROCEDURE   Your caregiver will instruct you when you need to stop eating and drinking.  Ask your caregiver if you need to change or stop any regular medicines. PROCEDURE Just before the procedure, you will receive medicine that will make you drowsy (sedative) or medicine to make you fall asleep (general anesthetic). This will be given through a tube that is inserted into one of your veins (intravenous [IV] tube). Then you will receive medicine to block pain from the waist down through your legs (spinal block). An incision is made in your hip. Your surgeon will take out any damaged cartilage and bone. Next, your surgeon will insert a prosthetic socket into your pelvic bone. This is usually secured with screws. Then, your  surgeon will cut off the ball of your thigh bone (femur) and attach a prosthetic ball on a stem to your femur. The surgeon then places the ball into the socket and checks the range of motion of your new hip. AFTER THE PROCEDURE  You will be taken to the recovery area where a nurse will watch and check your progress. Once you are awake and stable, you will be taken to a hospital room. You will receive physical therapy until you are doing well and your caregiver feels it is safe for you to go home. Typically, you will stay in  the hospital 1 4 days after your procedure. Document Released: 12/29/2000 Document Revised: 03/23/2012 Document Reviewed: 11/09/2011 [facility] Patient Information 2014 [place], [place].   CALL TO ARRANGE THERAPY

## 2013-02-24 NOTE — Progress Notes (Signed)
Patient ID: Sheila Blair, female   DOB: [birth year], 77 y.o.   MRN: [id number] Chief Complaint  Patient presents with  . Hip Pain    Right hip pain from groin all the way down leg   History today 77 year old female consult requested by Dr. Adriana Mccallum. She complains of right groin pain and pain radiating down into her right leg in the upper calf but on the anterior aspect of the leg. Her pain came on gradually no recent injury. She complains of dull throbbing for 10 intermittent pain which is worse after riding in the car and sitting it's better when she's lying down is worse when she's walking. She reports rare catch denies numbness tingling locking, denies any back pain. She's had episodes of requiring a walker or cane. Her pain responds well to tramadol  All medical systems were reviewed positive findings include weight gain and fatigue blurred vision eye pain wording of the eyes with chest palpitations shortness of breath and snoring heartburn urgency and frequency joint pain joint pain stiffness muscle pain skin changes, itching, unsteady gait, excessive urination and seasonal allergies  No past medical history on file. No past surgical history on file.  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 Right a cataract removal and implant  Bowel obstruction in 2005 second bowel obstruction a month later conjugated by another bowel obstruction 2 months later she had a breast lumpectomy in 2008   BP  126/88  Ht 5\' 8"  (1.727 m)  Wt 194 lb (87.998 kg)  BMI 29.5 kg/m2 General appearance is normal, the patient is alert and oriented x3 with normal mood and affect. She's emulating okay today without assistive device. She has no pain in the left hip good range of motion normal stability motor exam normal skin intact pulses good sensation normal  She has painful flexion of the right hip decreased flexion decreased internal rotation, painful rotation of the hip. No hip instability motor exam normal skin intact good pulse normal distal sensation  Upper extremity exam  The right and left upper extremity:   Inspection and palpation revealed no abnormalities in the upper extremities.   Range of motion is full without contracture.  Motor exam is normal with grade 5 strength.  The joints are fully reduced without subluxation.  There is no atrophy or tremor and muscle tone is normal.  All joints are stable.   Encounter Diagnosis  Name Primary?  Marland Kitchen Arthritis, hip Yes   She has arthritis of the hip by x-ray her symptoms are controlled with tramadol recommend continue also had physical therapy. When her pain becomes worse of course we would like to reevaluate for possible hip replacement

## 2013-03-16 ENCOUNTER — Telehealth (HOSPITAL_COMMUNITY): Payer: Self-pay

## 2013-03-16 ENCOUNTER — Ambulatory Visit (HOSPITAL_COMMUNITY): Payer: Medicare Other | Admitting: Physical Therapy

## 2013-07-17 ENCOUNTER — Encounter (HOSPITAL_COMMUNITY): Payer: Self-pay | Admitting: Emergency Medicine

## 2013-07-17 ENCOUNTER — Emergency Department (HOSPITAL_COMMUNITY)
Admission: EM | Admit: 2013-07-17 | Discharge: 2013-07-17 | Disposition: A | Discharge status: Home / Self Care | Payer: Medicare Other | Attending: Emergency Medicine | Admitting: Emergency Medicine

## 2013-07-17 DIAGNOSIS — N390 Urinary tract infection, site not specified: Secondary | ICD-10-CM

## 2013-07-17 DIAGNOSIS — R109 Unspecified abdominal pain: Secondary | ICD-10-CM | POA: Insufficient documentation

## 2013-07-17 DIAGNOSIS — E119 Type 2 diabetes mellitus without complications: Secondary | ICD-10-CM | POA: Insufficient documentation

## 2013-07-17 DIAGNOSIS — I10 Essential (primary) hypertension: Secondary | ICD-10-CM | POA: Insufficient documentation

## 2013-07-17 HISTORY — DX: Sleep apnea, unspecified: G47.30

## 2013-07-17 HISTORY — DX: Type 2 diabetes mellitus without complications: E11.9

## 2013-07-17 HISTORY — DX: Malignant neoplasm of unspecified site of unspecified female breast: C50.919

## 2013-07-17 HISTORY — DX: Essential (primary) hypertension: I10

## 2013-07-17 HISTORY — DX: Unspecified atrial fibrillation: I48.91

## 2013-07-17 LAB — URINE MICROSCOPIC-ADD ON

## 2013-07-17 LAB — URINALYSIS, ROUTINE W REFLEX MICROSCOPIC
Glucose, UA: NEGATIVE mg/dL
Protein, ur: 100 mg/dL — AB
Specific Gravity, Urine: >1.03 — ABNORMAL HIGH (ref 1.005–1.030)
Urobilinogen, UA: 0.2 mg/dL (ref 0.0–1.0)

## 2013-07-17 LAB — CBC WITH DIFFERENTIAL/PLATELET
Basophils Absolute: 0 10*3/uL (ref 0.0–0.1)
Lymphocytes Relative: 11 % — ABNORMAL LOW (ref 12–46)
Lymphs Abs: 0.9 10*3/uL (ref 0.7–4.0)
MCV: 92.9 fL (ref 78.0–100.0)
Neutro Abs: 7.1 10*3/uL (ref 1.7–7.7)
Platelets: 226 10*3/uL (ref 150–400)
RBC: 4.39 MIL/uL (ref 3.87–5.11)
RDW: 13.4 % (ref 11.5–15.5)
WBC: 8.5 10*3/uL (ref 4.0–10.5)

## 2013-07-17 LAB — BASIC METABOLIC PANEL
CO2: 28 mEq/L (ref 19–32)
Calcium: 9 mg/dL (ref 8.4–10.5)
Chloride: 97 mEq/L (ref 96–112)
Glucose, Bld: 144 mg/dL — ABNORMAL HIGH (ref 70–99)
Potassium: 3.9 mEq/L (ref 3.5–5.1)
Sodium: 138 mEq/L (ref 135–145)

## 2013-07-17 MED ORDER — PHENAZOPYRIDINE HCL 100 MG PO TABS
100.0000 mg | ORAL_TABLET | Freq: Once | ORAL | Status: AC
  Administered 2013-07-17: 100 mg via ORAL
  Filled 2013-07-17: qty 1

## 2013-07-17 MED ORDER — ACETAMINOPHEN 325 MG PO TABS
650.0000 mg | ORAL_TABLET | Freq: Once | ORAL | Status: AC
  Administered 2013-07-17: 650 mg via ORAL
  Filled 2013-07-17: qty 2

## 2013-07-17 MED ORDER — PHENAZOPYRIDINE HCL 200 MG PO TABS: 200.0000 mg | ORAL_TABLET | Freq: Three times a day (TID) | ORAL | Status: DC

## 2013-07-17 MED ORDER — SODIUM CHLORIDE 0.9 % IV SOLN
Freq: Once | INTRAVENOUS | Status: AC
  Administered 2013-07-17: 1000 mL via INTRAVENOUS

## 2013-07-17 MED ORDER — CEPHALEXIN 500 MG PO CAPS: 500.0000 mg | ORAL_CAPSULE | Freq: Two times a day (BID) | ORAL | Status: DC

## 2013-07-17 MED ORDER — DEXTROSE 5 % IV SOLN
1.0000 g | Freq: Once | INTRAVENOUS | Status: AC
  Administered 2013-07-17: 1 g via INTRAVENOUS
  Filled 2013-07-17: qty 10

## 2013-07-17 NOTE — ED Notes (Signed)
"  off and on" dribbles since 4am. Started having sx's early yesterday. Pressure to suprapubic area. Denies gi sx's. Alert/oreinted.

## 2013-07-17 NOTE — ED Notes (Signed)
Pt went to restroom and was able to void ~1cc. In and out cath obtained and was only able to  Obtain ~5 ml of cloudy, yellow return. To obtain bladder scanner to check residual also.

## 2013-07-17 NOTE — ED Notes (Signed)
Bladder scanner showing 30ml of urine at this time.

## 2013-07-17 NOTE — ED Provider Notes (Signed)
CSN: [id number]     Arrival date & time 07/17/13  0703 History  This chart was scribed for Shon Baton, MD by Caryn Bee, ED Scribe. This patient was seen in room APA11/AP[facility]11 and the patient's care was started 7:19 AM.    Chief Complaint  Patient presents with  . Urinary Retention   The history is provided by the patient. No language interpreter was used.   HPI Comments: Sheila Blair is a 77 y.o. female who presents to the Emergency Department complaining of gradual onset moderate urinary retention that began 07/16/2013. Pt states that the last time she urinated was yesterday, however it was abnormal. This morning pt states that she went to urinate and had a "dribble" off and on. She states that she has the sensation to urinate, but cannot actually urinate. Pt states that she has had increased urinary frequency for years. Pt states she has pressure and discomfort in her abdomen. Pt denies fever, SOB, dysuria, or hematuria.  Pt denies h/o similar symptoms. Pt states that she has had one UTI in the past that she can remember. Pt has h/o AFib, DM, and HTN. Pt is allergic to morphine and codeine. Pt is a wine drinker.    No past medical history on file. No past surgical history on file. No family history on file. History  Substance Use Topics  . Smoking status: Not on file  . Smokeless tobacco: Not on file  . Alcohol Use: Not on file   OB History   No data available     Review of Systems  Constitutional: Negative for fever.  Respiratory: Negative for cough, chest tightness and shortness of breath.   Cardiovascular: Negative for chest pain.  Gastrointestinal: Negative for nausea, vomiting and abdominal pain.  Genitourinary: Positive for urgency, decreased urine volume and difficulty urinating. Negative for dysuria and vaginal bleeding.  Musculoskeletal: Negative for back pain.  Skin: Negative for wound.  Neurological: Negative for headaches.  Psychiatric/Behavioral:  Negative for confusion.  All other systems reviewed and are negative.    Allergies  Codeine and Morphine and related  Home Medications  No current outpatient prescriptions on file.  Triage Vitals: BP 134/70  Pulse 77  Temp(Src) 97.9 F (36.6 C) (Oral)  Resp 18  SpO2 97%  Physical Exam  Nursing note and vitals reviewed. Constitutional: She is oriented to person, place, and time. She appears well-developed and well-nourished.  HENT:  Head: Normocephalic and atraumatic.  Mouth/Throat: Oropharynx is clear and moist.  Eyes: Pupils are equal, round, and reactive to light.  Neck: Neck supple.  Cardiovascular: Normal rate, regular rhythm and normal heart sounds.   Pulmonary/Chest: Effort normal and breath sounds normal. No respiratory distress. She has no wheezes.  Abdominal: Soft. Bowel sounds are normal. There is tenderness. There is no rebound and no guarding.  Midline scar well healed. Mild tenderness to palpation in suprapubic region.  Neurological: She is alert and oriented to person, place, and time.  Skin: Skin is warm and dry.  Psychiatric: She has a normal mood and affect.    ED Course  Procedures (including critical care time) DIAGNOSTIC STUDIES: Oxygen Saturation is 97% on room air, normal by my interpretation.    COORDINATION OF CARE: 7:23 AM-Discussed treatment plan which includes UA and catheterization  with pt at bedside and pt agreed to plan.   Labs Review Labs Reviewed  URINALYSIS, ROUTINE W REFLEX MICROSCOPIC - Abnormal; Notable for the following:    APPearance CLOUDY (*)  Specific Gravity, Urine >1.030 (*)    Hgb urine dipstick LARGE (*)    Bilirubin Urine SMALL (*)    Protein, ur 100 (*)    Nitrite POSITIVE (*)    Leukocytes, UA SMALL (*)    All other components within normal limits  CBC WITH DIFFERENTIAL - Abnormal; Notable for the following:    Neutrophils Relative % 83 (*)    Lymphocytes Relative 11 (*)    All other components within normal  limits  BASIC METABOLIC PANEL - Abnormal; Notable for the following:    Glucose, Bld 144 (*)    BUN 24 (*)    GFR calc non Af Amer 57 (*)    GFR calc Af Amer 66 (*)    All other components within normal limits  URINE MICROSCOPIC-ADD ON - Abnormal; Notable for the following:    Squamous Epithelial / LPF FEW (*)    Bacteria, UA MANY (*)    All other components within normal limits  URINE CULTURE   Imaging Review No results found.  EKG Interpretation   None       MDM   1. Urinary tract infection    Patient presents with urinary retention and suprapubic pain.  Afebrile and nontoxic  8:57 AM Patient had nitrite-positive urine with many bacteria. IV Rocephin ordered. Patient given pyridium. Patient had only 30 cc postvoid residual. However, she was only able to urinate 60 cc. Patient was given a normal saline bolus.  Patient spontaneously voiding normally prior to d/c.  Discharged with keflex and pyridium.  PCP followup.  After history, exam, and medical workup I feel the patient has been appropriately medically screened and is safe for discharge home. Pertinent diagnoses were discussed with the patient. Patient was given return precautions.  I personally performed the services described in this documentation, which was scribed in my presence. The recorded information has been reviewed and is accurate.   Shon Baton, MD 07/17/13 [phone]

## 2013-07-19 LAB — URINE CULTURE: Colony Count: >=100000

## 2013-09-08 ENCOUNTER — Encounter (INDEPENDENT_AMBULATORY_CARE_PROVIDER_SITE_OTHER): Payer: Self-pay

## 2013-09-08 ENCOUNTER — Ambulatory Visit (INDEPENDENT_AMBULATORY_CARE_PROVIDER_SITE_OTHER): Payer: Medicare Other | Admitting: Obstetrics & Gynecology

## 2013-09-08 ENCOUNTER — Encounter: Payer: Self-pay | Admitting: Obstetrics & Gynecology

## 2013-09-08 VITALS — BP 110/70 | Ht 67.0 in | Wt 201.0 lb

## 2013-09-08 DIAGNOSIS — Z1212 Encounter for screening for malignant neoplasm of rectum: Secondary | ICD-10-CM

## 2013-09-08 DIAGNOSIS — I10 Essential (primary) hypertension: Secondary | ICD-10-CM

## 2013-09-08 DIAGNOSIS — Z01419 Encounter for gynecological examination (general) (routine) without abnormal findings: Secondary | ICD-10-CM

## 2013-09-08 DIAGNOSIS — E7800 Pure hypercholesterolemia, unspecified: Secondary | ICD-10-CM | POA: Insufficient documentation

## 2013-09-08 MED ORDER — MIRABEGRON ER 25 MG PO TB24: 25.0000 mg | ORAL_TABLET | Freq: Every day | ORAL | Status: DC

## 2013-09-08 NOTE — Progress Notes (Signed)
Patient ID: [name], female   DOB: 08/17/1936, 77 y.o.   MRN: [id number] Subjective:     Sheila Blair is a 77 y.o. female here for a routine exam.  No LMP recorded. Patient has had a hysterectomy. No obstetric history on file. Birth Control Method:  na Menstrual Calendar(currently): na  Current complaints: urinary frequency.   Current acute medical issues:  none   Recent Gynecologic History No LMP recorded. Patient has had a hysterectomy. Last Pap: 5 years  na Last mammogram: 2013,  normal  Past Medical History  Diagnosis Date  . Hypertension   . Atrial fibrillation   . Breast cancer   . Sleep apnea   . Diabetes mellitus without complication     Past Surgical History  Procedure Laterality Date  . Breast surgery    . Appendectomy    . Cholecystectomy    . Abdominal hysterectomy    . Breast lumpectomy    . Benign thyroid tumor    . Vein ligation Right   . Abdominal surgery    . Small intestine surgery    . Hernia repair    . Cataract extraction    . Rectocyle surgery    . Pseudoptosis Bilateral     OB History   Grav Para Term Preterm Abortions TAB SAB Ect Mult Living                  History   Social History  . Marital Status: Married    Spouse Name: N/A    Number of Children: N/A  . Years of Education: N/A   Social History Main Topics  . Smoking status: Former Games developer  . Smokeless tobacco: None  . Alcohol Use: Yes     Comment: 2 glasses wine daily  . Drug Use: No  . Sexual Activity: None   Other Topics Concern  . None   Social History Narrative  . None    Family History  Problem Relation Age of Onset  . Heart disease Mother      Review of Systems  Review of Systems  Constitutional: Negative for fever, chills, weight loss, malaise/fatigue and diaphoresis.  HENT: Negative for hearing loss, ear pain, nosebleeds, congestion, sore throat, neck pain, tinnitus and ear discharge.   Eyes: Negative for blurred vision, double vision,  photophobia, pain, discharge and redness.  Respiratory: Negative for cough, hemoptysis, sputum production, shortness of breath, wheezing and stridor.   Cardiovascular: Negative for chest pain, palpitations, orthopnea, claudication, leg swelling and PND.  Gastrointestinal: negative for abdominal pain. Negative for heartburn, nausea, vomiting, diarrhea, constipation, blood in stool and melena.  Genitourinary: Negative for dysuria, urgency, frequency, hematuria and flank pain.  Musculoskeletal: Negative for myalgias, back pain, joint pain and falls.  Skin: Negative for itching and rash.  Neurological: Negative for dizziness, tingling, tremors, sensory change, speech change, focal weakness, seizures, loss of consciousness, weakness and headaches.  Endo/Heme/Allergies: Negative for environmental allergies and polydipsia. Does not bruise/bleed easily.  Psychiatric/Behavioral: Negative for depression, suicidal ideas, hallucinations, memory loss and substance abuse. The patient is not nervous/anxious and does not have insomnia.        Objective:    Physical Exam  Vitals reviewed. Constitutional: She is oriented to person, place, and time. She appears well-developed and well-nourished.  HENT:  Head: Normocephalic and atraumatic.        Right Ear: External ear normal.  Left Ear: External ear normal.  Nose: Nose normal.  Mouth/Throat: Oropharynx is clear and  moist.  Eyes: Conjunctivae and EOM are normal. Pupils are equal, round, and reactive to light. Right eye exhibits no discharge. Left eye exhibits no discharge. No scleral icterus.  Neck: Normal range of motion. Neck supple. No tracheal deviation present. No thyromegaly present.  Cardiovascular: Normal rate, regular rhythm, normal heart sounds and intact distal pulses.  Exam reveals no gallop and no friction rub.   No murmur heard. Respiratory: Effort normal and breath sounds normal. No respiratory distress. She has no wheezes. She has no rales.  She exhibits no tenderness.  GI: Soft. Bowel sounds are normal. She exhibits no distension and no mass. There is no tenderness. There is no rebound and no guarding.  Genitourinary:  Breasts no masses skin changes or nipple changes bilaterally      Vulva is normal without lesions Vagina is pink moist without discharge Cervix absent Uterus is absent Adnexa is not palpable Rectal    hemoccult negative, normal tone, no masses  Musculoskeletal: Normal range of motion. She exhibits no edema and no tenderness.  Neurological: She is alert and oriented to person, place, and time. She has normal reflexes. She displays normal reflexes. No cranial nerve deficit. She exhibits normal muscle tone. Coordination normal.  Skin: Skin is warm and dry. No rash noted. No erythema. No pallor.  Psychiatric: She has a normal mood and affect. Her behavior is normal. Judgment and thought content normal.       Assessment:    Healthy female exam.    Plan:    Follow up in: 2 years.

## 2013-09-21 ENCOUNTER — Other Ambulatory Visit: Payer: Self-pay | Admitting: Physician Assistant

## 2013-09-21 DIAGNOSIS — D229 Melanocytic nevi, unspecified: Secondary | ICD-10-CM

## 2013-09-21 HISTORY — DX: Melanocytic nevi, unspecified: D22.9

## 2013-10-06 HISTORY — PX: EYE SURGERY: SHX253

## 2013-10-20 ENCOUNTER — Ambulatory Visit: Payer: Medicare Other | Admitting: Obstetrics & Gynecology

## 2015-07-05 ENCOUNTER — Other Ambulatory Visit: Payer: Self-pay | Admitting: Internal Medicine

## 2015-07-05 ENCOUNTER — Other Ambulatory Visit (HOSPITAL_COMMUNITY): Payer: Self-pay | Admitting: Internal Medicine

## 2015-07-05 DIAGNOSIS — M8580 Other specified disorders of bone density and structure, unspecified site: Secondary | ICD-10-CM

## 2015-07-09 ENCOUNTER — Ambulatory Visit (INDEPENDENT_AMBULATORY_CARE_PROVIDER_SITE_OTHER): Payer: Medicare Other | Admitting: Internal Medicine

## 2015-07-09 ENCOUNTER — Encounter: Payer: Self-pay | Admitting: Internal Medicine

## 2015-07-09 VITALS — BP 118/60 | HR 67 | Ht 67.0 in | Wt 214.0 lb

## 2015-07-09 DIAGNOSIS — G4733 Obstructive sleep apnea (adult) (pediatric): Secondary | ICD-10-CM

## 2015-07-09 NOTE — Progress Notes (Signed)
07/09/15- 45 yoF referred courtesy of Dr Willey Blade concered about about CPAP mask causing bags under her eyes. wears CPAP about 10 hours every night x3 years. DME [employer]   NPSG 06/19/13- [facility]- severe obstructive sleep apnea-AHI 76.1 per hour with desaturation to 71%, BMI 29.7 A download from 2014 indicates she was using bilevel with inspiratory pressure 23, expiratory pressure of 19, ramp set at 8 for 20 minutes, by flex 2.0 and good compliance at that time. She says pressure has not been adjusted she was working primarily with extenders. She has used a fullface mask and that "machine has changed her life and she is sleeping very well". Bedtime between 9 and 10 PM, waking at 8:30 AM. Her primary complaint is that she blames bags under her eyes on her BiPAP machine. Her DME company has not changed her mask. ENT surgery-history tonsillectomy. Medical history includes hypertension, heart rhythm problems, diabetes and breast cancer  Prior to Admission medications   Medication Sig Start Date End Date Taking? Authorizing Provider  atorvastatin (LIPITOR) 10 MG tablet Take 10 mg by mouth at bedtime. 05/30/13  Yes Historical Provider, MD  citalopram (CELEXA) 40 MG tablet Take 40 mg by mouth every morning. 05/10/13  Yes Historical Provider, MD  diltiazem (CARDIZEM CD) 180 MG 24 hr capsule Take 180 mg by mouth 2 (two) times daily. 07/02/13  Yes Historical Provider, MD  docusate sodium (COLACE) 100 MG capsule Take 100 mg by mouth 2 (two) times daily.   Yes Historical Provider, MD  hydrochlorothiazide (HYDRODIURIL) 25 MG tablet Take 25 mg by mouth daily. 06/24/13  Yes Historical Provider, MD  losartan (COZAAR) 100 MG tablet Take 100 mg by mouth daily. 06/24/13  Yes Historical Provider, MD  metoprolol (LOPRESSOR) 100 MG tablet Take 100 mg by mouth 2 (two) times daily. 07/16/13  Yes Historical Provider, MD  mirabegron ER (MYRBETRIQ) 25 MG TB24 tablet Take 1 tablet (25 mg total) by mouth daily. Take 1 tablet  nightly 09/08/13  Yes Florian Buff, MD  phenazopyridine (PYRIDIUM) 200 MG tablet Take 1 tablet (200 mg total) by mouth 3 (three) times daily. 07/17/13  Yes Merryl Hacker, MD  Polyethyl Glycol-Propyl Glycol (SYSTANE) 0.4-0.3 % SOLN Place 1 drop into both eyes 2 (two) times daily as needed.   Yes Historical Provider, MD  PRADAXA 150 MG CAPS capsule Take 150 mg by mouth 2 (two) times daily. 07/14/13  Yes Historical Provider, MD  traMADol (ULTRAM) 50 MG tablet Take 50 mg by mouth 3 (three) times daily as needed. 06/21/13  Yes Historical Provider, MD   Past Medical History  Diagnosis Date  . Hypertension   . Atrial fibrillation ([place])   . Breast cancer ([place])   . Sleep apnea   . Diabetes mellitus without complication [facility])    Past Surgical History  Procedure Laterality Date  . Breast surgery    . Appendectomy    . Cholecystectomy    . Abdominal hysterectomy    . Breast lumpectomy    . Benign thyroid tumor    . Vein ligation Right   . Abdominal surgery    . Small intestine surgery    . Hernia repair    . Cataract extraction    . Rectocyle surgery    . Pseudoptosis Bilateral    Family History  Problem Relation Age of Onset  . Heart disease Mother    Social History   Social History  . Marital Status: Married    Spouse Name: N/A  .  Number of Children: N/A  . Years of Education: N/A   Occupational History  . Not on file.   Social History Main Topics  . Smoking status: Former Research scientist (life sciences)  . Smokeless tobacco: Not on file  . Alcohol Use: Yes     Comment: 2 glasses wine daily  . Drug Use: No  . Sexual Activity: Not on file   Other Topics Concern  . Not on file   Social History Narrative   ROS-see HPI   Negative unless "+" Constitutional:    weight loss, night sweats, fevers, chills, fatigue, lassitude. HEENT:    headaches, difficulty swallowing, tooth/dental problems, sore throat,       sneezing, itching, ear ache, nasal congestion, post nasal drip, snoring CV:    chest  pain, orthopnea, PND, swelling in lower extremities, anasarca,                                                        dizziness, + palpitations Resp:   + shortness of breath with exertion or at rest.                productive cough,   non-productive cough, coughing up of blood.              change in color of mucus.  wheezing.   Skin:    rash or lesions. GI:  No-   heartburn, indigestion, abdominal pain, nausea, vomiting, diarrhea,                 change in bowel habits, loss of appetite GU: dysuria, change in color of urine, no urgency or frequency.   flank pain. MS:  + joint pain, stiffness, decreased range of motion, back pain. Neuro-     nothing unusual Psych:  change in mood or affect.  depression or anxiety.   memory loss.  OBJ- Physical Exam General- Alert, Oriented, Affect-appropriate, Distress- none acute Skin- rash-none, lesions- none, excoriation- none Lymphadenopathy- none Head- atraumatic            Eyes- Gross vision intact, PERRLA, conjunctivae and secretions clear, + pretty significant bags under her eyes            Ears- Hearing, canals-normal            Nose- Clear, no-Septal dev, mucus, polyps, erosion, perforation             Throat- Mallampati III-IV , mucosa clear , drainage- none, tonsils- atrophic Neck- flexible , trachea midline, no stridor , thyroid nl, carotid no bruit Chest - symmetrical excursion , unlabored           Heart/CV- RRR , no murmur , no gallop  , no rub, nl s1 s2                           - JVD- none , edema- none, stasis changes- none, varices- none           Lung- clear to P&A, wheeze- none, cough- none , dullness-none, rub- none           Chest wall-  Abd-  Br/ Gen/ Rectal- Not done, not indicated Extrem- cyanosis- none, clubbing, none, atrophy- none, strength- nl Neuro- grossly intact to observation

## 2015-07-09 NOTE — Patient Instructions (Signed)
Order- DME [facility]--  Bilevel autotitration  Insp 10-25/  Exp 10-25              Mask of choice, humidifier, supplies    Dx OSA

## 2015-07-11 ENCOUNTER — Other Ambulatory Visit (HOSPITAL_COMMUNITY): Payer: Medicare Other

## 2015-07-11 ENCOUNTER — Other Ambulatory Visit (HOSPITAL_COMMUNITY): Payer: Self-pay

## 2015-07-12 ENCOUNTER — Ambulatory Visit (HOSPITAL_COMMUNITY)
Admission: RE | Admit: 2015-07-12 | Discharge: 2015-07-12 | Disposition: A | Discharge status: Home / Self Care | Payer: Medicare Other | Source: Ambulatory Visit | Attending: Internal Medicine | Admitting: Internal Medicine

## 2015-07-12 DIAGNOSIS — Z780 Asymptomatic menopausal state: Secondary | ICD-10-CM | POA: Insufficient documentation

## 2015-07-12 DIAGNOSIS — M8580 Other specified disorders of bone density and structure, unspecified site: Secondary | ICD-10-CM | POA: Insufficient documentation

## 2015-07-12 DIAGNOSIS — Z853 Personal history of malignant neoplasm of breast: Secondary | ICD-10-CM | POA: Insufficient documentation

## 2015-07-19 DIAGNOSIS — G4733 Obstructive sleep apnea (adult) (pediatric): Secondary | ICD-10-CM | POA: Insufficient documentation

## 2015-07-19 NOTE — Assessment & Plan Note (Signed)
She has been compliant and happy with BiPAP 23/19, definitely recognizing improved quality of life and better sleep. Her concern is that the mask may contribute to bags under her eyes which might be a pressure effect either from the mask or from the pressures she has been using through her machine. We will reassess her pressure needs then address mask options. Plan-autotitrator BiPAP for pressure guidance then assess for mask change.

## 2015-11-13 ENCOUNTER — Ambulatory Visit: Payer: Medicare Other | Admitting: Internal Medicine

## 2015-11-14 ENCOUNTER — Ambulatory Visit (INDEPENDENT_AMBULATORY_CARE_PROVIDER_SITE_OTHER): Payer: Medicare Other | Admitting: Internal Medicine

## 2015-11-14 ENCOUNTER — Encounter: Payer: Self-pay | Admitting: Internal Medicine

## 2015-11-14 VITALS — BP 122/74 | HR 58 | Ht 67.0 in | Wt 218.8 lb

## 2015-11-14 DIAGNOSIS — G4733 Obstructive sleep apnea (adult) (pediatric): Secondary | ICD-10-CM | POA: Diagnosis not present

## 2015-11-14 DIAGNOSIS — Z23 Encounter for immunization: Secondary | ICD-10-CM

## 2015-11-14 NOTE — Patient Instructions (Addendum)
We can continue  auto BIPAP/ [employer]. Please let us know if changes are needed                   DME please add AirView if able     Dx OSA  Prevnar 13

## 2015-11-14 NOTE — Progress Notes (Signed)
07/09/15- 39 yoF referred courtesy of Dr Willey Blade concered about about CPAP mask causing bags under her eyes. wears CPAP about 10 hours every night x3 years. DME [employer]   NPSG 06/19/13- [facility]BU- severe obstructive sleep apnea-AHI 76.1 per hour with desaturation to 71%, BMI 29.7 A download from 2014 indicates she was using bilevel with inspiratory pressure 23, expiratory pressure of 19, ramp set at 8 for 20 minutes, biflex 2.0 and good compliance at that time. She says pressure has not been adjusted she was working primarily with extenders. She has used a fullface mask and that "machine has changed her life and she is sleeping very well". Bedtime between 9 and 10 PM, waking at 8:30 AM. Her primary complaint is that she blames bags under her eyes on her BiPAP machine. Her DME company has not changed her mask. ENT surgery-history tonsillectomy. Medical history includes hypertension, heart rhythm problems, diabetes and breast cancer  11/14/2015-80 year old female followed for OSA BiPAP auto 10-25/ 10-25, biflex 2.0 [facility] FOLLOWS FOR: DME: [employer]. Pt wears BiPAP every night for about 8-10 hours She says she sleeps "great", 9 or 10 hours per night and likes BiPAP with auto setting. She had hoped control of sleep apnea would improve cosmetic effect of bags under her eyes but it has not. Remote sleep study. Asks Prevnar 13 pneumonia vaccine-discussed  ROS-see HPI   Negative unless "+" Constitutional:    weight loss, night sweats, fevers, chills, fatigue, lassitude. HEENT:    headaches, difficulty swallowing, tooth/dental problems, sore throat,       sneezing, itching, ear ache, nasal congestion, post nasal drip, snoring CV:    chest pain, orthopnea, PND, swelling in lower extremities, anasarca,                                                        dizziness, + palpitations Resp:   + shortness of breath with exertion or at rest.                productive cough,    non-productive cough, coughing up of blood.              change in color of mucus.  wheezing.   Skin:    rash or lesions. GI:  No-   heartburn, indigestion, abdominal pain, nausea, vomiting, diarrhea,                 change in bowel habits, loss of appetite GU: dysuria, change in color of urine, no urgency or frequency.   flank pain. MS:  + joint pain, stiffness, decreased range of motion, back pain. Neuro-     nothing unusual Psych:  change in mood or affect.  depression or anxiety.   memory loss.  OBJ- Physical Exam General- Alert, Oriented, Affect-appropriate, Distress- none acute Skin- rash-none, lesions- none, excoriation- none Lymphadenopathy- none Head- atraumatic            Eyes- Gross vision intact, PERRLA, conjunctivae and secretions clear, + pretty significant bags under her eyes            Ears- Hearing, canals-normal            Nose- Clear, no-Septal dev, mucus, polyps, erosion, perforation             Throat- Mallampati III-IV , mucosa clear ,  drainage- none, tonsils- atrophic Neck- flexible , trachea midline, no stridor , thyroid nl, carotid no bruit Chest - symmetrical excursion , unlabored           Heart/CV- RRR , no murmur , no gallop  , no rub, nl s1 s2                           - JVD- none , edema- none, stasis changes- none, varices- none           Lung- clear to P&A, wheeze- none, cough- none , dullness-none, rub- none           Chest wall-  Abd-  Br/ Gen/ Rectal- Not done, not indicated Extrem- cyanosis- none, clubbing, none, atrophy- none, strength- nl Neuro- grossly intact to observation

## 2015-11-17 NOTE — Assessment & Plan Note (Signed)
She describes excellent compliance and control with auto Pap BiPAP/[facility] Plan-continue present settings. Request Airview if available

## 2016-02-07 ENCOUNTER — Other Ambulatory Visit (HOSPITAL_COMMUNITY): Payer: Self-pay | Admitting: Internal Medicine

## 2016-02-07 ENCOUNTER — Ambulatory Visit (HOSPITAL_COMMUNITY)
Admission: RE | Admit: 2016-02-07 | Discharge: 2016-02-07 | Disposition: A | Discharge status: Home / Self Care | Payer: Medicare Other | Source: Ambulatory Visit | Attending: Internal Medicine | Admitting: Internal Medicine

## 2016-02-07 DIAGNOSIS — M25561 Pain in right knee: Secondary | ICD-10-CM | POA: Insufficient documentation

## 2016-02-07 DIAGNOSIS — M25761 Osteophyte, right knee: Secondary | ICD-10-CM | POA: Insufficient documentation

## 2016-02-07 DIAGNOSIS — M11261 Other chondrocalcinosis, right knee: Secondary | ICD-10-CM | POA: Diagnosis not present

## 2016-02-07 DIAGNOSIS — M25562 Pain in left knee: Secondary | ICD-10-CM | POA: Diagnosis present

## 2016-02-19 ENCOUNTER — Encounter (INDEPENDENT_AMBULATORY_CARE_PROVIDER_SITE_OTHER): Payer: Self-pay | Admitting: *Deleted

## 2016-03-27 ENCOUNTER — Other Ambulatory Visit (INDEPENDENT_AMBULATORY_CARE_PROVIDER_SITE_OTHER): Payer: Self-pay | Admitting: *Deleted

## 2016-03-27 DIAGNOSIS — Z86010 Personal history of colonic polyps: Secondary | ICD-10-CM

## 2016-04-23 ENCOUNTER — Encounter (INDEPENDENT_AMBULATORY_CARE_PROVIDER_SITE_OTHER): Payer: Self-pay | Admitting: *Deleted

## 2016-05-13 ENCOUNTER — Encounter: Payer: Self-pay | Admitting: Internal Medicine

## 2016-05-13 ENCOUNTER — Ambulatory Visit (INDEPENDENT_AMBULATORY_CARE_PROVIDER_SITE_OTHER): Payer: Medicare Other | Admitting: Internal Medicine

## 2016-05-13 VITALS — BP 118/72 | HR 63 | Ht 68.0 in | Wt 220.4 lb

## 2016-05-13 DIAGNOSIS — G4733 Obstructive sleep apnea (adult) (pediatric): Secondary | ICD-10-CM

## 2016-05-13 NOTE — Patient Instructions (Signed)
Order- Ok to meet with our [facility] to discuss alternative DME companies              Evaluate eligibility for replacement of old machine, mask of choice, humidifier, supplies, AirView    Dx OSA                                  VPAP Max IPAP 25, Min EPAP 10, PS 4                 Please help patient consider alternative mask- perhaps nasal or nasal pillows with chin strap  Please call as needed

## 2016-05-13 NOTE — Progress Notes (Signed)
07/09/15- 34 yoF referred courtesy of Dr Willey Blade concered about about CPAP mask causing bags under her eyes. wears CPAP about 10 hours every night x3 years. DME [employer]   NPSG 06/19/13- [facility]- severe obstructive sleep apnea-AHI 76.1 per hour with desaturation to 71%, BMI 29.7 A download from 2014 indicates she was using bilevel with inspiratory pressure 23, expiratory pressure of 19, ramp set at 8 for 20 minutes, biflex 2.0 and good compliance at that time. She says pressure has not been adjusted she was working primarily with extenders. She has used a fullface mask and that "machine has changed her life and she is sleeping very well". Bedtime between 9 and 10 PM, waking at 8:30 AM. Her primary complaint is that she blames bags under her eyes on her BiPAP machine. Her DME company has not changed her mask. ENT surgery-history tonsillectomy. Medical history includes hypertension, heart rhythm problems, diabetes and breast cancer  11/14/2015-80 year old female followed for OSA BiPAP auto 10-25/ 10-25, biflex 2.0 [facility] FOLLOWS FOR: DME: [employer]. Pt wears BiPAP every night for about 8-10 hours She says she sleeps "great", 9 or 10 hours per night and likes BiPAP with auto setting. She had hoped control of sleep apnea would improve cosmetic effect of bags under her eyes but it has not. Asks Prevnar 13 pneumonia vaccine-discussed  05/13/2016-80 year old female former smoker followed for OSA, complicated by atrial fibrillation, DM 2, HBP VPAP Max IPAP 25/ Min EPAP 10, /[employer] Doing well on CPAP. mask is causing her face to break out in a rash. Download confirms 100% compliance with 4 hour goal, AHI 3.7. Good control. CPAP has been a big help for her, clearly sleeping better. She remains disappointed that it didn't resolve bags under her eyes and she hoped. She asks about changing DME companies.  ROS-see HPI   Negative unless "+" Constitutional:    weight loss, night  sweats, fevers, chills, fatigue, lassitude. HEENT:    headaches, difficulty swallowing, tooth/dental problems, sore throat,       sneezing, itching, ear ache, nasal congestion, post nasal drip, snoring CV:    chest pain, orthopnea, PND, swelling in lower extremities, anasarca,                                                        dizziness, + palpitations Resp:   + shortness of breath with exertion or at rest.                productive cough,   non-productive cough, coughing up of blood.              change in color of mucus.  wheezing.   Skin:    rash or lesions. GI:  No-   heartburn, indigestion, abdominal pain, nausea, vomiting, diarrhea,                 change in bowel habits, loss of appetite GU: dysuria, change in color of urine, no urgency or frequency.   flank pain. MS:  + joint pain, stiffness, decreased range of motion, back pain. Neuro-     nothing unusual Psych:  change in mood or affect.  depression or anxiety.   memory loss.  OBJ- Physical Exam General- Alert, Oriented, Affect-appropriate, Distress- none acute Skin- rash-none, lesions- none, excoriation- none Lymphadenopathy- none Head- atraumatic  Eyes- Gross vision intact, PERRLA, conjunctivae and secretions clear, + pretty significant bags under her eyes            Ears- Hearing, canals-normal            Nose- Clear, no-Septal dev, mucus, polyps, erosion, perforation             Throat- Mallampati III-IV , mucosa clear , drainage- none, tonsils- atrophic Neck- flexible , trachea midline, no stridor , thyroid nl, carotid no bruit Chest - symmetrical excursion , unlabored           Heart/CV- RRR-very regular at this visit , no murmur , no gallop  , no rub, nl s1 s2                           - JVD- none , edema- none, stasis changes- none, varices- none           Lung- clear to P&A, wheeze- none, cough- none , dullness-none, rub- none           Chest wall-  Abd-  Br/ Gen/ Rectal- Not done, not  indicated Extrem- cyanosis- none, clubbing, none, atrophy- none, strength- nl Neuro- grossly intact to observation

## 2016-05-18 NOTE — Assessment & Plan Note (Addendum)
Doing very well with VPAP.Marland Kitchen She is comfortable with pressure range  but asks about changing DME companies. Plan-she will talk to patient care coordinator about her DME issues. Evaluate eligibility for replacement of old machine.

## 2016-05-21 ENCOUNTER — Encounter: Payer: Self-pay | Admitting: Internal Medicine

## 2016-06-23 ENCOUNTER — Encounter (INDEPENDENT_AMBULATORY_CARE_PROVIDER_SITE_OTHER): Payer: Self-pay | Admitting: *Deleted

## 2016-06-23 ENCOUNTER — Telehealth (INDEPENDENT_AMBULATORY_CARE_PROVIDER_SITE_OTHER): Payer: Self-pay | Admitting: *Deleted

## 2016-06-23 NOTE — Telephone Encounter (Signed)
Referring MD/PCP: fagan   Procedure: tcs  Reason/Indication:  Hx polyps  Has patient had this procedure before?  Yes, 2012 -- epic  If so, when, by whom and where?    Is there a family history of colon cancer?  no  Who?  What age when diagnosed?    Is patient diabetic?         Does patient have prosthetic heart valve or mechanical valve?  no  Do you have a pacemaker?  no  Has patient ever had endocarditis? no  Has patient had joint replacement within last 12 months?  no  Does patient tend to be constipated or take laxatives? no  Does patient have a history of alcohol/drug use?  no  Is patient on Coumadin, Plavix and/or Aspirin? yes  Medications: see epic  Allergies: see epic  Medication Adjustment: pradaxa 2 days  Procedure date & time: 07/10/16 at 815

## 2016-06-23 NOTE — Telephone Encounter (Signed)
Patient needs trilyte 

## 2016-06-24 ENCOUNTER — Telehealth (INDEPENDENT_AMBULATORY_CARE_PROVIDER_SITE_OTHER): Payer: Self-pay | Admitting: *Deleted

## 2016-06-24 MED ORDER — PEG 3350-KCL-NA BICARB-NACL 420 G PO SOLR: 4000.0000 mL | Freq: Once | ORAL | 0 refills | Status: AC

## 2016-06-24 NOTE — Telephone Encounter (Signed)
Per Andee Poles with Dr Willey Blade, it is ok for patient to stop Pradaxa 2 days prior to colonoscopy sch'd 07/10/16

## 2016-06-24 NOTE — Telephone Encounter (Signed)
agree

## 2016-06-24 NOTE — Telephone Encounter (Signed)
noted 

## 2016-07-09 ENCOUNTER — Ambulatory Visit (INDEPENDENT_AMBULATORY_CARE_PROVIDER_SITE_OTHER): Payer: Medicare Other

## 2016-07-09 DIAGNOSIS — Z23 Encounter for immunization: Secondary | ICD-10-CM | POA: Diagnosis not present

## 2016-07-10 ENCOUNTER — Encounter (HOSPITAL_COMMUNITY): Payer: Self-pay | Admitting: *Deleted

## 2016-07-10 ENCOUNTER — Ambulatory Visit (HOSPITAL_COMMUNITY)
Admission: RE | Admit: 2016-07-10 | Discharge: 2016-07-10 | Disposition: A | Discharge status: Home / Self Care | Payer: Medicare Other | Source: Ambulatory Visit | Attending: Internal Medicine | Admitting: Internal Medicine

## 2016-07-10 ENCOUNTER — Encounter (HOSPITAL_COMMUNITY)
Admission: RE | Disposition: A | Discharge status: Home / Self Care | Payer: Self-pay | Source: Ambulatory Visit | Attending: Internal Medicine

## 2016-07-10 DIAGNOSIS — Z8349 Family history of other endocrine, nutritional and metabolic diseases: Secondary | ICD-10-CM | POA: Insufficient documentation

## 2016-07-10 DIAGNOSIS — G4730 Sleep apnea, unspecified: Secondary | ICD-10-CM | POA: Diagnosis not present

## 2016-07-10 DIAGNOSIS — I4891 Unspecified atrial fibrillation: Secondary | ICD-10-CM | POA: Insufficient documentation

## 2016-07-10 DIAGNOSIS — E119 Type 2 diabetes mellitus without complications: Secondary | ICD-10-CM | POA: Diagnosis not present

## 2016-07-10 DIAGNOSIS — Z853 Personal history of malignant neoplasm of breast: Secondary | ICD-10-CM | POA: Diagnosis not present

## 2016-07-10 DIAGNOSIS — Z87891 Personal history of nicotine dependence: Secondary | ICD-10-CM | POA: Diagnosis not present

## 2016-07-10 DIAGNOSIS — Z885 Allergy status to narcotic agent status: Secondary | ICD-10-CM | POA: Diagnosis not present

## 2016-07-10 DIAGNOSIS — Z1211 Encounter for screening for malignant neoplasm of colon: Secondary | ICD-10-CM | POA: Diagnosis not present

## 2016-07-10 DIAGNOSIS — I10 Essential (primary) hypertension: Secondary | ICD-10-CM | POA: Diagnosis not present

## 2016-07-10 DIAGNOSIS — Z90710 Acquired absence of both cervix and uterus: Secondary | ICD-10-CM | POA: Diagnosis not present

## 2016-07-10 DIAGNOSIS — Z9849 Cataract extraction status, unspecified eye: Secondary | ICD-10-CM | POA: Insufficient documentation

## 2016-07-10 DIAGNOSIS — Z7901 Long term (current) use of anticoagulants: Secondary | ICD-10-CM | POA: Diagnosis not present

## 2016-07-10 DIAGNOSIS — K6389 Other specified diseases of intestine: Secondary | ICD-10-CM | POA: Diagnosis not present

## 2016-07-10 DIAGNOSIS — Z9049 Acquired absence of other specified parts of digestive tract: Secondary | ICD-10-CM | POA: Insufficient documentation

## 2016-07-10 DIAGNOSIS — Z09 Encounter for follow-up examination after completed treatment for conditions other than malignant neoplasm: Secondary | ICD-10-CM | POA: Diagnosis not present

## 2016-07-10 DIAGNOSIS — Z86010 Personal history of colonic polyps: Secondary | ICD-10-CM | POA: Insufficient documentation

## 2016-07-10 DIAGNOSIS — Z79899 Other long term (current) drug therapy: Secondary | ICD-10-CM | POA: Insufficient documentation

## 2016-07-10 DIAGNOSIS — Z8249 Family history of ischemic heart disease and other diseases of the circulatory system: Secondary | ICD-10-CM | POA: Insufficient documentation

## 2016-07-10 HISTORY — PX: COLONOSCOPY: SHX5424

## 2016-07-10 LAB — GLUCOSE, CAPILLARY: Glucose-Capillary: 153 mg/dL — ABNORMAL HIGH (ref 65–99)

## 2016-07-10 SURGERY — COLONOSCOPY
Anesthesia: Moderate Sedation

## 2016-07-10 MED ORDER — STERILE WATER FOR IRRIGATION IR SOLN
Status: DC | PRN
  Administered 2016-07-10: 09:00:00

## 2016-07-10 MED ORDER — MEPERIDINE HCL 50 MG/ML IJ SOLN
INTRAMUSCULAR | Status: AC
  Filled 2016-07-10: qty 1

## 2016-07-10 MED ORDER — MIDAZOLAM HCL 5 MG/5ML IJ SOLN
INTRAMUSCULAR | Status: AC
  Filled 2016-07-10: qty 10

## 2016-07-10 MED ORDER — MIDAZOLAM HCL 5 MG/5ML IJ SOLN
INTRAMUSCULAR | Status: DC | PRN
Start: 2016-07-10 — End: 2016-07-10
  Administered 2016-07-10 (×2): 1 mg via INTRAVENOUS
  Administered 2016-07-10 (×2): 2 mg via INTRAVENOUS
  Administered 2016-07-10: 1 mg via INTRAVENOUS

## 2016-07-10 MED ORDER — MEPERIDINE HCL 50 MG/ML IJ SOLN
INTRAMUSCULAR | Status: DC | PRN
  Administered 2016-07-10 (×2): 25 mg via INTRAVENOUS

## 2016-07-10 MED ORDER — SODIUM CHLORIDE 0.9 % IV SOLN
INTRAVENOUS | Status: DC
  Administered 2016-07-10: 1000 mL via INTRAVENOUS

## 2016-07-10 NOTE — Discharge Instructions (Signed)
Resume usual medications including Pradaxa at usual dose. Resume usual diet. No driving for 24 hours.   Colonoscopy, Care After These instructions give you information on caring for yourself after your procedure. Your doctor may also give you more specific instructions. Call your doctor if you have any problems or questions after your procedure. HOME CARE  Do not drive for 24 hours.  Do not sign important papers or use machinery for 24 hours.  You may shower.  You may go back to your usual activities, but go slower for the first 24 hours.  Take rest breaks often during the first 24 hours.  Walk around or use warm packs on your belly (abdomen) if you have belly cramping or gas.  Drink enough fluids to keep your pee (urine) clear or pale yellow.  Resume your normal diet. Avoid heavy or fried foods.  Avoid drinking alcohol for 24 hours or as told by your doctor.  Only take medicines as told by your doctor. If a tissue sample (biopsy) was taken during the procedure:   Do not take aspirin or blood thinners for 7 days, or as told by your doctor.  Do not drink alcohol for 7 days, or as told by your doctor.  Eat soft foods for the first 24 hours. GET HELP IF: You still have a small amount of blood in your poop (stool) 2-3 days after the procedure. GET HELP RIGHT AWAY IF:  You have more than a small amount of blood in your poop.  You see clumps of tissue (blood clots) in your poop.  Your belly is puffy (swollen).  You feel sick to your stomach (nauseous) or throw up (vomit).  You have a fever.  You have belly pain that gets worse and medicine does not help. MAKE SURE YOU:  Understand these instructions.  Will watch your condition.  Will get help right away if you are not doing well or get worse.   This information is not intended to replace advice given to you by your health care provider. Make sure you discuss any questions you have with your health care provider.   Document Released: 10/25/2010 Document Revised: 09/27/2013 Document Reviewed: 05/30/2013 Elsevier Interactive Patient Education [employer].

## 2016-07-10 NOTE — Op Note (Signed)
[facility] Patient Name: Sheila Blair Procedure Date: 07/10/2016 8:22 AM MRN: [id number] Date of Birth: Dec 10, 1935 Attending MD: Hildred Laser , MD CSN: [id number] Age: 80 Admit Type: Outpatient Procedure:                Colonoscopy Indications:              High risk colon cancer surveillance: Personal                            history of colonic polyps Providers:                Hildred Laser, MD, Otis Peak B. Sharon Seller, RN, Isabella Stalling, Technician Referring MD:             Asencion Noble, MD Medicines:                Meperidine 50 mg IV, Midazolam 7 mg IV Complications:            No immediate complications. Estimated Blood Loss:     Estimated blood loss: none. Procedure:                Pre-Anesthesia Assessment:                           - Prior to the procedure, a History and Physical                            was performed, and patient medications and                            allergies were reviewed. The patient's tolerance of                            previous anesthesia was also reviewed. The risks                            and benefits of the procedure and the sedation                            options and risks were discussed with the patient.                            All questions were answered, and informed consent                            was obtained. Prior Anticoagulants: The patient                            last took Pradaxa (dabigatran) 3 days prior to the                            procedure. ASA Grade Assessment: III - A patient  with severe systemic disease. After reviewing the                            risks and benefits, the patient was deemed in                            satisfactory condition to undergo the procedure.                           After obtaining informed consent, the colonoscope                            was passed under direct vision. Throughout the   procedure, the patient's blood pressure, pulse, and                            oxygen saturations were monitored continuously. The                            [id number]) scope was introduced through                            the anus and advanced to the the cecum, identified                            by appendiceal orifice and ileocecal valve. The                            colonoscopy was technically difficult and complex                            due to {skip}large ventral Hernia. Successful                            completion of the procedure was aided by changing                            the patient to a supine position and withdrawing                            and reinserting the scope. The patient tolerated                            the procedure well. The quality of the bowel                            preparation was excellent. The ileocecal valve,                            appendiceal orifice, and rectum were photographed. Scope In: 8:37:25 AM Scope Out: 9:18:43 AM Scope Withdrawal Time: 0 hours 9 minutes 27 seconds  Total Procedure Duration: 0 hours 41 minutes 18 seconds  Findings:      The transverse colon, hepatic flexure, ascending colon, cecum,  appendiceal orifice and ileocecal valve appeared normal.      A diffuse area of mild melanosis was found in the rectum, in the       recto-sigmoid colon, in the sigmoid colon, in the descending colon and       at the splenic flexure.      The retroflexed view of the distal rectum and anal verge was normal and       showed no anal or rectal abnormalities. Impression:               - The transverse colon, hepatic flexure, ascending                            colon, cecum, appendiceal orifice and ileocecal                            valve are normal.                           - Melanosis in the colon.                           - No specimens collected. Moderate Sedation:      Moderate (conscious) sedation was  administered by the endoscopy nurse       and supervised by the endoscopist. The following parameters were       monitored: oxygen saturation, heart rate, blood pressure, CO2       capnography and response to care. Total physician intraservice time was       45 minutes. Recommendation:           - Patient has a contact number available for                            emergencies. The signs and symptoms of potential                            delayed complications were discussed with the                            patient. Return to normal activities tomorrow.                            Written discharge instructions were provided to the                            patient.                           - Resume previous diet today.                           - Continue present medications.                           - Resume Pradaxa (dabigatran) at prior dose today.                           -  No recommendation at this time regarding repeat                            colonoscopy due to age. Will determine in 5 years. Procedure Code(s):        --- Professional ---                           [phone], Colonoscopy, flexible; diagnostic, including                            collection of specimen(s) by brushing or washing,                            when performed (separate procedure)                           99152, Moderate sedation services provided by the                            same physician or other qualified health care                            professional performing the diagnostic or                            therapeutic service that the sedation supports,                            requiring the presence of an independent trained                            observer to assist in the monitoring of the                            patient's level of consciousness and physiological                            status; initial 15 minutes of intraservice time,                            patient  age 55 years or older                           [phone], Moderate sedation services; each additional                            15 minutes intraservice time                           99153, Moderate sedation services; each additional                            15 minutes intraservice time Diagnosis Code(s):        --- Professional ---  Z86.010, Personal history of colonic polyps                           K63.89, Other specified diseases of intestine CPT copyright 2016 [employer]. All rights reserved. The codes documented in this report are preliminary and upon coder review may  be revised to meet current compliance requirements. Hildred Laser, MD Hildred Laser, MD 07/10/2016 9:27:12 AM This report has been signed electronically. Number of Addenda: 0

## 2016-07-10 NOTE — H&P (Signed)
Sheila Blair is an 80 y.o. female.   Chief Complaint: Patient is here for colonoscopy. HPI: 80 year old Caucasian female was at multiple colonic adenomas and prior colonoscopy. She had 3 on her last exam of May 2012. She denies abdominal pain change in bowel habits or rectal bleeding. Family history is negative for CRC.  Past Medical History:  Diagnosis Date  . Atrial fibrillation ([place])   . Breast cancer ([place])   . Diabetes mellitus without complication ([place])   . Hypertension   . Sleep apnea     Past Surgical History:  Procedure Laterality Date  . ABDOMINAL HYSTERECTOMY    . ABDOMINAL SURGERY    . APPENDECTOMY    . benign thyroid tumor    . BREAST LUMPECTOMY    . BREAST SURGERY    . CATARACT EXTRACTION    . CHOLECYSTECTOMY    . EYE SURGERY  2015   to correct drooping eye lids  . HERNIA REPAIR    . pseudoptosis Bilateral   . rectocyle surgery    . SMALL INTESTINE SURGERY    . VEIN LIGATION Right     Family History  Problem Relation Age of Onset  . Heart disease Mother   . Dementia Sister    Social History:  reports that she has quit smoking. She has never used smokeless tobacco. She reports that she does not drink alcohol or use drugs.  Allergies:  Allergies  Allergen Reactions  . Codeine Shortness Of Breath  . Morphine And Related Shortness Of Breath    Medications Prior to Admission  Medication Sig Dispense Refill  . atorvastatin (LIPITOR) 10 MG tablet Take 10 mg by mouth at bedtime.    . citalopram (CELEXA) 40 MG tablet Take 40 mg by mouth every morning.    . diltiazem (CARDIZEM CD) 180 MG 24 hr capsule Take 180 mg by mouth 2 (two) times daily.    . hydrochlorothiazide (HYDRODIURIL) 25 MG tablet Take 25 mg by mouth daily.    Marland Kitchen losartan (COZAAR) 100 MG tablet Take 100 mg by mouth daily.    . metoprolol (LOPRESSOR) 100 MG tablet Take 100 mg by mouth 2 (two) times daily.    Vladimir Faster Glycol-Propyl Glycol (SYSTANE) 0.4-0.3 % SOLN Place 1 drop into both eyes 2  (two) times daily as needed.    . polyethylene glycol-electrolytes (TRILYTE) 420 g solution Take 4,000 mLs by mouth once.    . traMADol (ULTRAM) 50 MG tablet Take 50 mg by mouth 3 (three) times daily as needed.    . docusate sodium (COLACE) 100 MG capsule Take 100 mg by mouth 2 (two) times daily.    Marland Kitchen PRADAXA 150 MG CAPS capsule Take 150 mg by mouth 2 (two) times daily.      Results for orders placed or performed during the hospital encounter of 07/10/16 (from the past 48 hour(s))  Glucose, capillary     Status: Abnormal   Collection Time: 07/10/16  7:45 AM  Result Value Ref Range   Glucose-Capillary 153 (H) 65 - 99 mg/dL   No results found.  ROS  Blood pressure 115/73, pulse 80, temperature 97.6 F (36.4 C), temperature source Oral, resp. rate 20, height 5\' 8"  (1.727 m), weight 190 lb (86.2 kg), SpO2 95 %. Physical Exam  Constitutional: She appears well-developed and well-nourished.  HENT:  Mouth/Throat: Oropharynx is clear and moist.  Eyes: Conjunctivae are normal. No scleral icterus.  Neck: No thyromegaly present.  Cardiovascular:  Irregular rhythm normal S1 and  S2. No murmur or gallop noted.  GI:  Abdomen is full with large ventral hernia at left low quadrant of her abdomen to the left of midline scar. No tenderness or organomegaly noted.  Musculoskeletal: She exhibits no edema.  Lymphadenopathy:    She has no cervical adenopathy.  Neurological: She is alert.  Skin: Skin is warm and dry.     Assessment/Plan History of colonic adenomas. Surveillance colonoscopy.  Hildred Laser, MD 07/10/2016, 8:25 AM

## 2016-07-15 ENCOUNTER — Encounter (HOSPITAL_COMMUNITY): Payer: Self-pay | Admitting: Internal Medicine

## 2017-04-06 ENCOUNTER — Other Ambulatory Visit: Payer: Self-pay | Admitting: Dermatology

## 2017-04-06 DIAGNOSIS — C4492 Squamous cell carcinoma of skin, unspecified: Secondary | ICD-10-CM

## 2017-04-06 HISTORY — DX: Squamous cell carcinoma of skin, unspecified: C44.92

## 2017-05-13 ENCOUNTER — Ambulatory Visit: Payer: Medicare Other | Admitting: Internal Medicine

## 2017-10-06 HISTORY — PX: BREAST LUMPECTOMY: SHX2

## 2018-11-22 ENCOUNTER — Other Ambulatory Visit (HOSPITAL_COMMUNITY): Payer: Self-pay | Admitting: Internal Medicine

## 2018-11-22 DIAGNOSIS — M8580 Other specified disorders of bone density and structure, unspecified site: Secondary | ICD-10-CM

## 2018-12-01 ENCOUNTER — Ambulatory Visit (HOSPITAL_COMMUNITY)
Admission: RE | Admit: 2018-12-01 | Discharge: 2018-12-01 | Disposition: A | Discharge status: Home / Self Care | Payer: Medicare Other | Source: Ambulatory Visit | Attending: Internal Medicine | Admitting: Internal Medicine

## 2018-12-01 ENCOUNTER — Other Ambulatory Visit (HOSPITAL_COMMUNITY): Payer: Self-pay | Admitting: Internal Medicine

## 2018-12-01 DIAGNOSIS — M85852 Other specified disorders of bone density and structure, left thigh: Secondary | ICD-10-CM | POA: Diagnosis not present

## 2018-12-01 DIAGNOSIS — M8580 Other specified disorders of bone density and structure, unspecified site: Secondary | ICD-10-CM

## 2019-04-14 ENCOUNTER — Other Ambulatory Visit: Payer: Medicare Other

## 2019-04-14 ENCOUNTER — Other Ambulatory Visit: Payer: Self-pay

## 2019-04-14 DIAGNOSIS — Z20822 Contact with and (suspected) exposure to covid-19: Secondary | ICD-10-CM

## 2019-04-18 LAB — NOVEL CORONAVIRUS, NAA: SARS-CoV-2, NAA: NOT DETECTED

## 2019-11-22 DIAGNOSIS — G4733 Obstructive sleep apnea (adult) (pediatric): Secondary | ICD-10-CM | POA: Diagnosis not present

## 2019-11-23 DIAGNOSIS — L309 Dermatitis, unspecified: Secondary | ICD-10-CM | POA: Diagnosis not present

## 2019-11-23 DIAGNOSIS — D229 Melanocytic nevi, unspecified: Secondary | ICD-10-CM | POA: Diagnosis not present

## 2019-11-30 ENCOUNTER — Encounter: Payer: Self-pay | Admitting: *Deleted

## 2019-12-01 DIAGNOSIS — G4733 Obstructive sleep apnea (adult) (pediatric): Secondary | ICD-10-CM | POA: Diagnosis not present

## 2019-12-19 ENCOUNTER — Other Ambulatory Visit: Payer: Self-pay

## 2019-12-19 ENCOUNTER — Encounter: Payer: Self-pay | Admitting: Dermatology

## 2019-12-19 ENCOUNTER — Telehealth: Payer: Self-pay | Admitting: Dermatology

## 2019-12-19 ENCOUNTER — Ambulatory Visit: Payer: Medicare PPO | Admitting: Dermatology

## 2019-12-19 DIAGNOSIS — C44722 Squamous cell carcinoma of skin of right lower limb, including hip: Secondary | ICD-10-CM

## 2019-12-19 DIAGNOSIS — C4492 Squamous cell carcinoma of skin, unspecified: Secondary | ICD-10-CM

## 2019-12-19 HISTORY — DX: Squamous cell carcinoma of skin, unspecified: C44.92

## 2019-12-19 MED ORDER — MUPIROCIN CALCIUM 2 % EX CREA: 1.0000 "application " | TOPICAL_CREAM | Freq: Every day | CUTANEOUS | 1 refills | Status: DC

## 2019-12-19 NOTE — Progress Notes (Signed)
   Follow-Up Visit   Subjective  Sheila Blair is a 84 y.o. female who presents for the following: Follow-up (recheck place on right lower leg x 47-months. Pt has been using Halog and it's improved per pt).  The following portions of the chart were reviewed this encounter and updated as appropriate:     Review of Systems: No other skin or systemic complaints.  Objective  Well appearing patient in no apparent distress; mood and affect are within normal limits.  A focused examination was performed including lower extremities, including the legs, feet. Relevant physical exam findings are noted in the Assessment and Plan.  Objective  Right Lower Leg - Anterior: Treated after biopsy with curettage plus electrocautery.     Assessment & Plan  Neoplasm of skin Right Lower Leg - Anterior  Skin / nail biopsy Type of biopsy: tangential   Anesthesia: the lesion was anesthetized in a standard fashion   Anesthetic:  1% lidocaine w/ epinephrine 1-100,000 local infiltration Instrument used: flexible razor blade   Hemostasis achieved with: ferric subsulfate   Outcome: patient tolerated procedure well   Post-procedure details: sterile dressing applied and wound care instructions given   Dressing type: petrolatum   Additional details:  Patient identified lesion of concern.  Lesion identified by physician.  mupirocin cream (BACTROBAN) 2 %  Destruction of lesion  Specimen 1 - Surgical pathology Differential Diagnosis: BCC vs SCC Check Margins: No Treated after biopsy 0.9cm  Patient tried using a topical steroid for several weeks with improvement in inflammation but no improvement and the actual lesion which was a 89mm keratotic horn.  The top of the lesion was shaved biopsied and the base treated with curette plus electrocautery.  Daily topical mupirocin.  Follow-up by phone in 1 week to inform us of status and review pathology.

## 2019-12-19 NOTE — Telephone Encounter (Signed)
[facility] called and they want to know if patient can get Mupirocin Ointment instead of cream.  They would have to order the cream.  Their phone number is [phone]

## 2019-12-21 ENCOUNTER — Telehealth: Payer: Self-pay

## 2019-12-21 NOTE — Telephone Encounter (Signed)
Phone call to patient to give path results.  Told her to keep her follow-up appointment on April 14th.

## 2019-12-21 NOTE — Telephone Encounter (Signed)
-----   Message from Lavonna Monarch, MD sent at 12/21/2019  7:26 AM EDT ----- SCCA, treated with biopsy, schedule recheck 2-3 months.

## 2019-12-27 DIAGNOSIS — I482 Chronic atrial fibrillation, unspecified: Secondary | ICD-10-CM | POA: Diagnosis not present

## 2019-12-27 DIAGNOSIS — I10 Essential (primary) hypertension: Secondary | ICD-10-CM | POA: Diagnosis not present

## 2019-12-27 DIAGNOSIS — F419 Anxiety disorder, unspecified: Secondary | ICD-10-CM | POA: Diagnosis not present

## 2019-12-27 DIAGNOSIS — E1129 Type 2 diabetes mellitus with other diabetic kidney complication: Secondary | ICD-10-CM | POA: Diagnosis not present

## 2019-12-27 DIAGNOSIS — E785 Hyperlipidemia, unspecified: Secondary | ICD-10-CM | POA: Diagnosis not present

## 2019-12-27 DIAGNOSIS — E039 Hypothyroidism, unspecified: Secondary | ICD-10-CM | POA: Diagnosis not present

## 2019-12-29 DIAGNOSIS — G4733 Obstructive sleep apnea (adult) (pediatric): Secondary | ICD-10-CM | POA: Diagnosis not present

## 2019-12-29 DIAGNOSIS — Z0001 Encounter for general adult medical examination with abnormal findings: Secondary | ICD-10-CM | POA: Diagnosis not present

## 2019-12-29 DIAGNOSIS — I10 Essential (primary) hypertension: Secondary | ICD-10-CM | POA: Diagnosis not present

## 2019-12-29 DIAGNOSIS — E1129 Type 2 diabetes mellitus with other diabetic kidney complication: Secondary | ICD-10-CM | POA: Diagnosis not present

## 2019-12-29 DIAGNOSIS — I482 Chronic atrial fibrillation, unspecified: Secondary | ICD-10-CM | POA: Diagnosis not present

## 2019-12-29 DIAGNOSIS — M1712 Unilateral primary osteoarthritis, left knee: Secondary | ICD-10-CM | POA: Diagnosis not present

## 2019-12-29 DIAGNOSIS — Z853 Personal history of malignant neoplasm of breast: Secondary | ICD-10-CM | POA: Diagnosis not present

## 2019-12-31 NOTE — Addendum Note (Signed)
Addended by: Lavonna Monarch on: 12/31/2019 04:35 PM   Modules accepted: Orders

## 2020-01-05 DIAGNOSIS — M1712 Unilateral primary osteoarthritis, left knee: Secondary | ICD-10-CM | POA: Diagnosis not present

## 2020-01-06 DIAGNOSIS — C50311 Malignant neoplasm of lower-inner quadrant of right female breast: Secondary | ICD-10-CM | POA: Diagnosis not present

## 2020-01-06 DIAGNOSIS — Z923 Personal history of irradiation: Secondary | ICD-10-CM | POA: Diagnosis not present

## 2020-01-06 DIAGNOSIS — Z08 Encounter for follow-up examination after completed treatment for malignant neoplasm: Secondary | ICD-10-CM | POA: Diagnosis not present

## 2020-01-06 DIAGNOSIS — C50919 Malignant neoplasm of unspecified site of unspecified female breast: Secondary | ICD-10-CM | POA: Diagnosis not present

## 2020-01-06 DIAGNOSIS — Z853 Personal history of malignant neoplasm of breast: Secondary | ICD-10-CM | POA: Diagnosis not present

## 2020-01-12 DIAGNOSIS — M1712 Unilateral primary osteoarthritis, left knee: Secondary | ICD-10-CM | POA: Diagnosis not present

## 2020-01-16 DIAGNOSIS — C50911 Malignant neoplasm of unspecified site of right female breast: Secondary | ICD-10-CM | POA: Diagnosis not present

## 2020-01-17 DIAGNOSIS — G4733 Obstructive sleep apnea (adult) (pediatric): Secondary | ICD-10-CM | POA: Diagnosis not present

## 2020-01-18 ENCOUNTER — Encounter (INDEPENDENT_AMBULATORY_CARE_PROVIDER_SITE_OTHER): Payer: Self-pay

## 2020-01-18 ENCOUNTER — Encounter: Payer: Self-pay | Admitting: *Deleted

## 2020-01-18 ENCOUNTER — Other Ambulatory Visit: Payer: Self-pay

## 2020-01-18 ENCOUNTER — Ambulatory Visit: Payer: Medicare PPO | Admitting: Dermatology

## 2020-01-18 ENCOUNTER — Encounter: Payer: Self-pay | Admitting: Dermatology

## 2020-01-18 DIAGNOSIS — D225 Melanocytic nevi of trunk: Secondary | ICD-10-CM | POA: Diagnosis not present

## 2020-01-18 DIAGNOSIS — D229 Melanocytic nevi, unspecified: Secondary | ICD-10-CM

## 2020-01-18 DIAGNOSIS — Z85828 Personal history of other malignant neoplasm of skin: Secondary | ICD-10-CM | POA: Diagnosis not present

## 2020-01-18 DIAGNOSIS — D0462 Carcinoma in situ of skin of left upper limb, including shoulder: Secondary | ICD-10-CM | POA: Diagnosis not present

## 2020-01-18 NOTE — Patient Instructions (Signed)

## 2020-01-20 ENCOUNTER — Encounter: Payer: Self-pay | Admitting: Dermatology

## 2020-01-20 NOTE — Progress Notes (Addendum)
   Follow-Up Visit   Subjective  Sheila Blair is a 84 y.o. female who presents for the following: Follow-up (re check right lower leg. Biopsy done on 3/15 and was treated at the same time. Patient thinks its doing well and she is using mupirocin ointment. Check left sholder area. Flakey spot that bled when she scraped it off. ).  New growth Location: Left shoulder Duration: Months Quality: Under Associated Signs/Symptoms: Bled Modifying Factors:  Severity:  Timing: Context: History of skin cancer  The following portions of the chart were reviewed this encounter and updated as appropriate: Tobacco  Allergies  Meds  Problems  Med Hx  Surg Hx  Fam Hx      Objective  Well appearing patient in no apparent distress; mood and affect are within normal limits.  A focused examination was performed including head, neck, back, chest, arms, legs. Relevant physical exam findings are noted in the Assessment and Plan.   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 Left shoulder  Destruction of lesion Complexity: simple   Destruction method: electrodesiccation and curettage   Informed consent: discussed and consent obtained   Timeout:  patient name, date of birth, surgical site, and procedure verified Anesthesia: the lesion was anesthetized in a standard fashion   Anesthetic:  1% lidocaine w/ epinephrine 1-100,000 local infiltration Curettage performed in three different directions: Yes   Curettage cycles:  3 Lesion length (cm):  1.3 Lesion width (cm):  1 Margin per side (cm):  0 Final wound size (cm):  1.3 Hemostasis achieved with:  ferric subsulfate Outcome: patient tolerated procedure well with no complications   Post-procedure details: wound care instructions given   Additional details:  Inoculated with parenteral 5% fluorouracil  Destruction of lesion  Specimen 1 - Surgical pathology Differential Diagnosis: bcc vs scc Check  Margins: No  Shave biopsy followed by C&D base.  Nevus (2) Left Upper Back; Right Upper Back  observe Leg smooth, may discontinue mupirocin. No atypical moles.

## 2020-01-23 DIAGNOSIS — M8580 Other specified disorders of bone density and structure, unspecified site: Secondary | ICD-10-CM | POA: Diagnosis not present

## 2020-01-23 DIAGNOSIS — Z923 Personal history of irradiation: Secondary | ICD-10-CM | POA: Diagnosis not present

## 2020-01-23 DIAGNOSIS — C50911 Malignant neoplasm of unspecified site of right female breast: Secondary | ICD-10-CM | POA: Diagnosis not present

## 2020-01-23 DIAGNOSIS — C50311 Malignant neoplasm of lower-inner quadrant of right female breast: Secondary | ICD-10-CM | POA: Diagnosis not present

## 2020-01-23 DIAGNOSIS — Z170 Estrogen receptor positive status [ER+]: Secondary | ICD-10-CM | POA: Diagnosis not present

## 2020-01-23 DIAGNOSIS — Z9221 Personal history of antineoplastic chemotherapy: Secondary | ICD-10-CM | POA: Diagnosis not present

## 2020-01-29 DIAGNOSIS — G4733 Obstructive sleep apnea (adult) (pediatric): Secondary | ICD-10-CM | POA: Diagnosis not present

## 2020-02-28 DIAGNOSIS — G4733 Obstructive sleep apnea (adult) (pediatric): Secondary | ICD-10-CM | POA: Diagnosis not present

## 2020-03-30 DIAGNOSIS — G4733 Obstructive sleep apnea (adult) (pediatric): Secondary | ICD-10-CM | POA: Diagnosis not present

## 2020-04-17 DIAGNOSIS — G4733 Obstructive sleep apnea (adult) (pediatric): Secondary | ICD-10-CM | POA: Diagnosis not present

## 2020-04-23 DIAGNOSIS — C50911 Malignant neoplasm of unspecified site of right female breast: Secondary | ICD-10-CM | POA: Diagnosis not present

## 2020-04-23 DIAGNOSIS — C50912 Malignant neoplasm of unspecified site of left female breast: Secondary | ICD-10-CM | POA: Diagnosis not present

## 2020-04-23 DIAGNOSIS — Z79899 Other long term (current) drug therapy: Secondary | ICD-10-CM | POA: Diagnosis not present

## 2020-04-23 DIAGNOSIS — E1129 Type 2 diabetes mellitus with other diabetic kidney complication: Secondary | ICD-10-CM | POA: Diagnosis not present

## 2020-04-29 DIAGNOSIS — G4733 Obstructive sleep apnea (adult) (pediatric): Secondary | ICD-10-CM | POA: Diagnosis not present

## 2020-04-30 DIAGNOSIS — R32 Unspecified urinary incontinence: Secondary | ICD-10-CM | POA: Diagnosis not present

## 2020-04-30 DIAGNOSIS — E1129 Type 2 diabetes mellitus with other diabetic kidney complication: Secondary | ICD-10-CM | POA: Diagnosis not present

## 2020-04-30 DIAGNOSIS — R609 Edema, unspecified: Secondary | ICD-10-CM | POA: Diagnosis not present

## 2020-05-29 DIAGNOSIS — R609 Edema, unspecified: Secondary | ICD-10-CM | POA: Diagnosis not present

## 2020-05-29 DIAGNOSIS — I4821 Permanent atrial fibrillation: Secondary | ICD-10-CM | POA: Diagnosis not present

## 2020-05-30 DIAGNOSIS — G4733 Obstructive sleep apnea (adult) (pediatric): Secondary | ICD-10-CM | POA: Diagnosis not present

## 2020-06-04 DIAGNOSIS — L03116 Cellulitis of left lower limb: Secondary | ICD-10-CM | POA: Diagnosis not present

## 2020-06-08 DIAGNOSIS — Z961 Presence of intraocular lens: Secondary | ICD-10-CM | POA: Diagnosis not present

## 2020-06-08 DIAGNOSIS — H401112 Primary open-angle glaucoma, right eye, moderate stage: Secondary | ICD-10-CM | POA: Diagnosis not present

## 2020-06-30 DIAGNOSIS — G4733 Obstructive sleep apnea (adult) (pediatric): Secondary | ICD-10-CM | POA: Diagnosis not present

## 2020-07-18 DIAGNOSIS — G4733 Obstructive sleep apnea (adult) (pediatric): Secondary | ICD-10-CM | POA: Diagnosis not present

## 2020-07-18 DIAGNOSIS — M1712 Unilateral primary osteoarthritis, left knee: Secondary | ICD-10-CM | POA: Diagnosis not present

## 2020-07-18 DIAGNOSIS — M545 Low back pain, unspecified: Secondary | ICD-10-CM | POA: Diagnosis not present

## 2020-07-18 DIAGNOSIS — M25551 Pain in right hip: Secondary | ICD-10-CM | POA: Diagnosis not present

## 2020-07-23 ENCOUNTER — Ambulatory Visit: Payer: Medicare PPO | Admitting: Dermatology

## 2020-07-25 DIAGNOSIS — M25551 Pain in right hip: Secondary | ICD-10-CM | POA: Diagnosis not present

## 2020-07-26 DIAGNOSIS — M1712 Unilateral primary osteoarthritis, left knee: Secondary | ICD-10-CM | POA: Diagnosis not present

## 2020-07-29 NOTE — Progress Notes (Signed)
[place] NOTE  Patient Care Team: Asencion Noble, MD as PCP - General (Internal Medicine)  CHIEF COMPLAINTS/PURPOSE OF CONSULTATION:  History of bilateral breast cancers  HISTORY OF PRESENTING ILLNESS:  Sheila Blair 84 y.o. female is here because of a history of left breast cancer in 2009 and right breast cancer in 2019. Screening mammogram detected a left breast mass. She underwent a lumpectomy and sentinel lymph node biopsy showing invasive ductal carcinoma, grade 2, 1.3cm, HER-2 negative, ER/PR negative, with 1.5 lymph nodes positive for micrometastases. She completed adjuvant radiation and 2 cycles of adjuvant chemotherapy with Taxol and Carboplatin, which was discontinued after she was hospitalized with neutropenia sepsis. Mammogram in 07/2018 showed a right breast mass. Biopsy showed atypical papillary lesion. She underwent a lumpectomy on 08/19/18 for which pathology showed invasive papillary carcinoma, 1.2cm, stage pT1c, ER/PR+, HER-2 negative. She underwent right breast radiation from 10/01/18-10/26/18. She was started on antiestrogen therapy with anastrozole on 11/15/18, with a planned treatment duration of 5 years. 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 Her most recent mammogram on 01/06/20 showed no evidence of malignancy bilaterally. She was previously under care at [facility] She presents to the clinic today for initial evaluation and to transfer her care.  Her major complaint is related to joint stiffness and achiness.  She has diffuse osteoarthritis especially in her right hip.  She has received cortisone injections which have benefited her.  She is otherwise tolerating the antiestrogen therapy fairly well.  She feels very stiff and achy especially after she rests for a while.  I reviewed her records extensively and collaborated the history with the patient.  SUMMARY OF ONCOLOGIC HISTORY: Oncology History  Bilateral breast cancer  ([place])  2009 Initial Diagnosis   Left breast cancer 2009: Grade 2 IDC 1.3 cm, triple negative, 1/5 lymph node micrometastases, adjuvant radiation, 2 cycles of Taxol and carboplatin discontinued for sepsis   2019 Relapse/Recurrence   Right breast cancer 07/2018: Status post lumpectomy 08/19/2018: Invasive papillary cancer 1.2 cm T1CN0 ER/PR positive HER-2 negative status post radiation completed 10/26/2018 [facility])   11/15/2018 -  Anti-estrogen oral therapy   Anastrozole     MEDICAL HISTORY:  Past Medical History:  Diagnosis Date  . Atrial fibrillation ([place])   . Atypical nevus 09/21/2013   Mild-Left tricep  . Breast cancer ([place])   . Diabetes mellitus without complication ([place])   . Hypertension   . SCC (squamous cell carcinoma) 12/19/2019   right lower leg anterior-txpbx  . SCCA (squamous cell carcinoma) of skin 04/06/2017   RIGHT UPPER ARM-CX3,5FU  . Sleep apnea     SURGICAL HISTORY: Past Surgical History:  Procedure Laterality Date  . ABDOMINAL HYSTERECTOMY    . ABDOMINAL SURGERY    . APPENDECTOMY    . benign thyroid tumor    . BREAST LUMPECTOMY    . BREAST SURGERY    . CATARACT EXTRACTION    . CHOLECYSTECTOMY    . COLONOSCOPY N/A 07/10/2016   Procedure: COLONOSCOPY;  Surgeon: Rogene Houston, MD;  Location: [facility];  Service: Endoscopy;  Laterality: N/A;  930 - moved to 11/2 @ 9:30  . EYE SURGERY  2015   to correct drooping eye lids  . HERNIA REPAIR    . pseudoptosis Bilateral   . rectocyle surgery    . SMALL INTESTINE SURGERY    . VEIN LIGATION Right     SOCIAL HISTORY: Social History   Socioeconomic History  .  Marital status: Married    Spouse name: Not on file  . Number of children: Not on file  . Years of education: Not on file  . Highest education level: Not on file  Occupational History  . Not on file  Tobacco Use  . Smoking status: Former Research scientist (life sciences)  . Smokeless tobacco: Never Used  Substance and Sexual Activity  . Alcohol use: No  .  Drug use: No  . Sexual activity: Not on file  Other Topics Concern  . Not on file  Social History Narrative  . Not on file   Social Determinants of Health   Financial Resource Strain:   . Difficulty of Paying Living Expenses: Not on file  Food Insecurity:   . Worried About Charity fundraiser in the Last Year: Not on file  . Ran Out of Food in the Last Year: Not on file  Transportation Needs:   . Lack of Transportation (Medical): Not on file  . Lack of Transportation (Non-Medical): Not on file  Physical Activity:   . Days of Exercise per Week: Not on file  . Minutes of Exercise per Session: Not on file  Stress:   . Feeling of Stress : Not on file  Social Connections:   . Frequency of Communication with Friends and Family: Not on file  . Frequency of Social Gatherings with Friends and Family: Not on file  . Attends Religious Services: Not on file  . Active Member of Clubs or Organizations: Not on file  . Attends Archivist Meetings: Not on file  . Marital Status: Not on file  Intimate Partner Violence:   . Fear of Current or Ex-Partner: Not on file  . Emotionally Abused: Not on file  . Physically Abused: Not on file  . Sexually Abused: Not on file    FAMILY HISTORY: Family History  Problem Relation Age of Onset  . Heart disease Mother   . Dementia Sister     ALLERGIES:  is allergic to codeine and morphine and related.  MEDICATIONS:  Current Outpatient Medications  Medication Sig Dispense Refill  . anastrozole (ARIMIDEX) 1 MG tablet Take by mouth.    Marland Kitchen atorvastatin (LIPITOR) 10 MG tablet Take 10 mg by mouth at bedtime.    Marland Kitchen atorvastatin (LIPITOR) 10 MG tablet atorvastatin 10 mg tablet    . citalopram (CELEXA) 20 MG tablet citalopram 20 mg tablet    . citalopram (CELEXA) 40 MG tablet Take 40 mg by mouth every morning.    . diltiazem (CARDIZEM CD) 180 MG 24 hr capsule Take 180 mg by mouth 2 (two) times daily.    Marland Kitchen diltiazem (TIAZAC) 180 MG 24 hr capsule  Take by mouth.    . docusate sodium (COLACE) 100 MG capsule Take 100 mg by mouth 2 (two) times daily.    . hydrochlorothiazide (HYDRODIURIL) 25 MG tablet Take 25 mg by mouth daily.    Marland Kitchen latanoprost (XALATAN) 0.005 % ophthalmic solution Place 1 drop into the right eye at bedtime.    Marland Kitchen losartan (COZAAR) 100 MG tablet Take 100 mg by mouth daily.    . metoprolol (LOPRESSOR) 100 MG tablet Take 100 mg by mouth 2 (two) times daily.    . mupirocin cream (BACTROBAN) 2 % Apply 1 application topically daily. 22 g 1  . olmesartan (BENICAR) 20 MG tablet olmesartan 20 mg tablet    . Polyethyl Glycol-Propyl Glycol (SYSTANE) 0.4-0.3 % SOLN Place 1 drop into both eyes 2 (two) times daily as  needed.    Marland Kitchen PRADAXA 150 MG CAPS capsule Take 150 mg by mouth 2 (two) times daily.    . traMADol (ULTRAM) 50 MG tablet Take 50 mg by mouth 3 (three) times daily as needed.     No current facility-administered medications for this visit.    REVIEW OF SYSTEMS:   Diffuse osteoarthritis All other systems were reviewed with the patient and are negative.  PHYSICAL EXAMINATION: ECOG PERFORMANCE STATUS: 1 - Symptomatic but completely ambulatory  Vitals:   07/30/20 1307  BP: (!) 147/95  Pulse: 64  Resp: 18  Temp: (!) 97.3 F (36.3 C)  SpO2: 93%   Filed Weights   07/30/20 1307  Weight: 208 lb 1.6 oz (94.4 kg)      BREAST:  No palpable nodules in breast. No palpable axillary or supraclavicular lymphadenopathy (exam performed in the presence of a chaperone)   LABORATORY DATA:  I have reviewed the data as listed Lab Results  Component Value Date   WBC 8.5 07/17/2013   HGB 14.1 07/17/2013   HCT 40.8 07/17/2013   MCV 92.9 07/17/2013   PLT 226 07/17/2013   Lab Results  Component Value Date   NA 138 07/17/2013   K 3.9 07/17/2013   CL 97 07/17/2013   CO2 28 07/17/2013    RADIOGRAPHIC STUDIES: I have personally reviewed the radiological reports and agreed with the findings in the report.  ASSESSMENT AND  PLAN:  Bilateral breast cancer ([place]) History of bilateral breast cancers Left breast cancer 2009: Grade 2 IDC 1.3 cm, triple negative, 1/5 lymph node micrometastases, adjuvant radiation, 2 cycles of Taxol and carboplatin discontinued for sepsis Right breast cancer 07/2018: Status post lumpectomy 08/19/2018: Invasive papillary cancer 1.2 cm T1CN0 ER/PR positive HER-2 negative status post radiation completed 10/26/2018  Current treatment: Anastrozole started 11/15/2018 Anastrozole toxicities: Diffuse arthralgias and myalgias: I do not know if it is related to anastrozole therapy.  Therefore instructed her to stop it for 2 weeks.  I will connect with her after 2 weeks and see if it is related to anastrozole then we can switch her to letrozole therapy.  Breast cancer surveillance: 1.  Breast exam 07/30/2020: Benign 2. Mammogram done in April 2021 at [facility]: Benign we will set up the next mammogram in April 2022. 3.  Bone density 12/01/2018: T score -1.4: Osteopenia  Telephone visit in 2 weeks to discuss if her joint symptoms have improved after stopping antiestrogen therapy with anastrozole.   All questions were answered. The patient knows to call the clinic with any problems, questions or concerns.   Rulon Eisenmenger, MD, MPH 07/30/2020    I, Molly Dorshimer, am acting as scribe for Nicholas Lose, MD.  I have reviewed the above documentation for accuracy and completeness, and I agree with the above.

## 2020-07-30 ENCOUNTER — Other Ambulatory Visit: Payer: Self-pay

## 2020-07-30 ENCOUNTER — Inpatient Hospital Stay: Payer: Medicare PPO | Attending: Hematology and Oncology | Admitting: Hematology and Oncology

## 2020-07-30 DIAGNOSIS — M2550 Pain in unspecified joint: Secondary | ICD-10-CM | POA: Diagnosis not present

## 2020-07-30 DIAGNOSIS — Z79811 Long term (current) use of aromatase inhibitors: Secondary | ICD-10-CM | POA: Diagnosis not present

## 2020-07-30 DIAGNOSIS — G4733 Obstructive sleep apnea (adult) (pediatric): Secondary | ICD-10-CM | POA: Diagnosis not present

## 2020-07-30 DIAGNOSIS — M791 Myalgia, unspecified site: Secondary | ICD-10-CM | POA: Diagnosis not present

## 2020-07-30 DIAGNOSIS — Z923 Personal history of irradiation: Secondary | ICD-10-CM | POA: Insufficient documentation

## 2020-07-30 DIAGNOSIS — M8580 Other specified disorders of bone density and structure, unspecified site: Secondary | ICD-10-CM | POA: Diagnosis not present

## 2020-07-30 DIAGNOSIS — Z87891 Personal history of nicotine dependence: Secondary | ICD-10-CM | POA: Insufficient documentation

## 2020-07-30 DIAGNOSIS — C50912 Malignant neoplasm of unspecified site of left female breast: Secondary | ICD-10-CM | POA: Insufficient documentation

## 2020-07-30 DIAGNOSIS — C50911 Malignant neoplasm of unspecified site of right female breast: Secondary | ICD-10-CM | POA: Insufficient documentation

## 2020-07-30 DIAGNOSIS — Z9221 Personal history of antineoplastic chemotherapy: Secondary | ICD-10-CM | POA: Diagnosis not present

## 2020-07-30 DIAGNOSIS — Z170 Estrogen receptor positive status [ER+]: Secondary | ICD-10-CM | POA: Diagnosis not present

## 2020-07-30 NOTE — Assessment & Plan Note (Signed)
History of bilateral breast cancers Left breast cancer 2009: Grade 2 IDC 1.3 cm, triple negative, 1/5 lymph node micrometastases, adjuvant radiation, 2 cycles of Taxol and carboplatin discontinued for sepsis Right breast cancer 07/2018: Status post lumpectomy 08/19/2018: Invasive papillary cancer 1.2 cm T1CN0 ER/PR positive HER-2 negative status post radiation completed 10/26/2018  Current treatment: Anastrozole started 11/15/2018 Anastrozole toxicities:  Breast cancer surveillance: 1.  Breast exam 07/30/2020: Benign 2. Mammogram 3.  Bone density 12/01/2018: T score -1.4: Osteopenia

## 2020-08-01 DIAGNOSIS — M1712 Unilateral primary osteoarthritis, left knee: Secondary | ICD-10-CM | POA: Diagnosis not present

## 2020-08-02 ENCOUNTER — Ambulatory Visit: Payer: Medicare PPO | Attending: Internal Medicine

## 2020-08-02 DIAGNOSIS — Z23 Encounter for immunization: Secondary | ICD-10-CM | POA: Diagnosis not present

## 2020-08-02 DIAGNOSIS — I10 Essential (primary) hypertension: Secondary | ICD-10-CM | POA: Diagnosis not present

## 2020-08-02 DIAGNOSIS — I482 Chronic atrial fibrillation, unspecified: Secondary | ICD-10-CM | POA: Diagnosis not present

## 2020-08-02 NOTE — Progress Notes (Signed)
   Covid-19 Vaccination Clinic  Name:  YOUA DELONEY    MRN: [id number] DOB: 03-29-1936  08/02/2020  Ms. Otter was observed post Covid-19 immunization for 15 minutes without incident. She was provided with Vaccine Information Sheet and instruction to access the V-Safe system.   Ms. Kinlaw was instructed to call 911 with any severe reactions post vaccine: Marland Kitchen Difficulty breathing  . Swelling of face and throat  . A fast heartbeat  . A bad rash all over body  . Dizziness and weakness

## 2020-08-13 ENCOUNTER — Telehealth: Payer: Self-pay | Admitting: Hematology and Oncology

## 2020-08-13 ENCOUNTER — Ambulatory Visit: Payer: Medicare PPO | Admitting: Hematology and Oncology

## 2020-08-13 NOTE — Telephone Encounter (Signed)
Contacted patient to verify telephone visit for pre reg °

## 2020-08-13 NOTE — Progress Notes (Signed)
HEMATOLOGY-ONCOLOGY TELEPHONE VISIT PROGRESS NOTE  I connected with Sheila Blair on 08/14/2020 at  9:00 AM EST by telephone and verified that I am speaking with the correct person using two identifiers.  I discussed the limitations, risks, security and privacy concerns of performing an evaluation and management service by telephone and the availability of in person appointments.  I also discussed with the patient that there may be a patient responsible charge related to this service. The patient expressed understanding and agreed to proceed.   History of Present Illness: Sheila Blair is a 84 y.o. female with above-mentioned history of left breast cancer in 2009 and right breast cancer in 2019 who was on antiestrogen therapy with anastrozole but discontinued it due to diffuse arthralgias and myalgias. She presents over the phone today for follow-up.  She has not noticed any improvement since she stopped anastrozole therapy in fact she feels worse.  Her hips and knees are bothering her significantly.  She has received the knee and gel injections as well as a hip steroid injections they do seem to work for little bit but she is not getting significant pain relief.  She is walking with the help of a cane.  She is also concerned about her fluctuating blood pressures.  She is on multiple blood pressure medications and her primary care physician is adjusting them.  She will discuss with her primary care physician about getting a cardiology consult.   Sheila Blair Dr. Payton Mccallum here from the [facility] Oncology History  Bilateral breast cancer [facility])  2009 Initial Diagnosis   Left breast cancer 2009: Grade 2 IDC 1.3 cm, triple negative, 1/5 lymph node micrometastases, adjuvant radiation, 2 cycles of Taxol and carboplatin discontinued for sepsis   2019 Relapse/Recurrence   Right breast cancer 07/2018: Status post lumpectomy 08/19/2018: Invasive papillary cancer 1.2 cm T1CN0 ER/PR positive HER-2  negative status post radiation completed 10/26/2018 [facility])   11/15/2018 -  Anti-estrogen oral therapy   Anastrozole       Assessment Plan:  Bilateral breast cancer ([place]) History of bilateral breast cancers Left breast cancer 2009: Grade 2 IDC 1.3 cm, triple negative, 1/5 lymph node micrometastases, adjuvant radiation, 2 cycles of Taxol and carboplatin discontinued for sepsis Right breast cancer 07/2018: Status post lumpectomy 08/19/2018: Invasive papillary cancer 1.2 cm T1CN0 ER/PR positive HER-2 negative status post radiation completed 10/26/2018  Current treatment: Anastrozole started 11/15/2018  Anastrozole toxicities: Diffuse arthralgias and myalgias: After stopping Anastrozole, some symptoms are better but generally symptoms are worse. She does not think its related to Anastrozole She will restart it again  BP Issues: She thinks since her meds were adjusted, her blood pressure is running high.  She when she was at her primary care physician her blood pressure was great.  Breast cancer surveillance: 1.  Breast exam 07/30/2020: Benign 2. Mammogram done in April 2021 at [facility]: Benign we will set up the next mammogram in April 2022. 3.  Bone density 12/01/2018: T score -1.4: Osteopenia   Knee and hip injections: only a slight improvement. Shes thinking about hip replacement and knee replacement  Return to clinic in October 2022 for follow-up.  She gets a mammogram in April 2022.  I discussed the assessment and treatment plan with the patient. The patient was provided an opportunity to ask questions and all were answered. The patient agreed with the plan and demonstrated an understanding of the instructions. The patient was advised to call back or seek an in-person evaluation  if the symptoms worsen or if the condition fails to improve as anticipated.   I provided 15 minutes of non-face-to-face time during this encounter.   Rulon Eisenmenger, MD 08/14/2020     I, Molly Dorshimer, am acting as scribe for Nicholas Lose, MD.  I have reviewed the above documentation for accuracy and completeness, and I agree with the above.

## 2020-08-14 ENCOUNTER — Inpatient Hospital Stay: Payer: Medicare PPO | Attending: Hematology and Oncology | Admitting: Hematology and Oncology

## 2020-08-14 DIAGNOSIS — C50911 Malignant neoplasm of unspecified site of right female breast: Secondary | ICD-10-CM | POA: Diagnosis not present

## 2020-08-14 DIAGNOSIS — Z170 Estrogen receptor positive status [ER+]: Secondary | ICD-10-CM | POA: Diagnosis not present

## 2020-08-14 DIAGNOSIS — C50912 Malignant neoplasm of unspecified site of left female breast: Secondary | ICD-10-CM

## 2020-08-14 MED ORDER — ANASTROZOLE 1 MG PO TABS: 1.0000 mg | ORAL_TABLET | Freq: Every day | ORAL | 3 refills | Status: DC

## 2020-08-14 NOTE — Assessment & Plan Note (Signed)
History of bilateral breast cancers Left breast cancer 2009: Grade 2 IDC 1.3 cm, triple negative, 1/5 lymph node micrometastases, adjuvant radiation, 2 cycles of Taxol and carboplatin discontinued for sepsis Right breast cancer 07/2018: Status post lumpectomy 08/19/2018: Invasive papillary cancer 1.2 cm T1CN0 ER/PR positive HER-2 negative status post radiation completed 10/26/2018  Current treatment: Anastrozole started 11/15/2018  Anastrozole toxicities: Diffuse arthralgias and myalgias: I do not know if it is related to anastrozole therapy.  Therefore instructed her to stop it for 2 weeks.  I will connect with her after 2 weeks and see if it is related to anastrozole then we can switch her to letrozole therapy.  Breast cancer surveillance: 1.  Breast exam 07/30/2020: Benign 2. Mammogram done in April 2021 at [facility]: Benign we will set up the next mammogram in April 2022. 3.  Bone density 12/01/2018: T score -1.4: Osteopenia

## 2020-08-30 DIAGNOSIS — G4733 Obstructive sleep apnea (adult) (pediatric): Secondary | ICD-10-CM | POA: Diagnosis not present

## 2020-09-14 DIAGNOSIS — M1712 Unilateral primary osteoarthritis, left knee: Secondary | ICD-10-CM | POA: Diagnosis not present

## 2020-09-29 DIAGNOSIS — G4733 Obstructive sleep apnea (adult) (pediatric): Secondary | ICD-10-CM | POA: Diagnosis not present

## 2020-10-04 ENCOUNTER — Other Ambulatory Visit: Payer: Medicare PPO

## 2020-10-09 ENCOUNTER — Telehealth: Payer: Self-pay

## 2020-10-09 NOTE — Telephone Encounter (Signed)
Pt called to report discoloration to left breast.    Patient with history of left breast cancer in 2009, right breast cancer 2019.    Patient denies any trauma to left breast.  Color appears as dark purple per patient.  Starts on left breast and goes up to chest.  Patient denies any lumps or nodules.    RN reviewed with MD - MD recommendations to be evaluated in office.  RN scheduled apt for patient, patient verbalized understanding and agreement.

## 2020-10-10 ENCOUNTER — Other Ambulatory Visit: Payer: Self-pay

## 2020-10-10 ENCOUNTER — Inpatient Hospital Stay: Payer: Medicare PPO | Attending: Hematology and Oncology | Admitting: Hematology and Oncology

## 2020-10-10 DIAGNOSIS — Z170 Estrogen receptor positive status [ER+]: Secondary | ICD-10-CM | POA: Diagnosis not present

## 2020-10-10 DIAGNOSIS — E1129 Type 2 diabetes mellitus with other diabetic kidney complication: Secondary | ICD-10-CM | POA: Diagnosis not present

## 2020-10-10 DIAGNOSIS — Z7901 Long term (current) use of anticoagulants: Secondary | ICD-10-CM | POA: Insufficient documentation

## 2020-10-10 DIAGNOSIS — Z79811 Long term (current) use of aromatase inhibitors: Secondary | ICD-10-CM | POA: Diagnosis not present

## 2020-10-10 DIAGNOSIS — Z79899 Other long term (current) drug therapy: Secondary | ICD-10-CM | POA: Insufficient documentation

## 2020-10-10 DIAGNOSIS — I10 Essential (primary) hypertension: Secondary | ICD-10-CM | POA: Diagnosis not present

## 2020-10-10 DIAGNOSIS — Z923 Personal history of irradiation: Secondary | ICD-10-CM | POA: Diagnosis not present

## 2020-10-10 DIAGNOSIS — R600 Localized edema: Secondary | ICD-10-CM | POA: Diagnosis not present

## 2020-10-10 DIAGNOSIS — C50912 Malignant neoplasm of unspecified site of left female breast: Secondary | ICD-10-CM | POA: Insufficient documentation

## 2020-10-10 DIAGNOSIS — C50911 Malignant neoplasm of unspecified site of right female breast: Secondary | ICD-10-CM | POA: Insufficient documentation

## 2020-10-10 DIAGNOSIS — I4821 Permanent atrial fibrillation: Secondary | ICD-10-CM | POA: Diagnosis not present

## 2020-10-10 NOTE — Progress Notes (Signed)
Patient Care Team: Asencion Noble, MD as PCP - General (Internal Medicine)  DIAGNOSIS:    ICD-10-CM   1. Bilateral malignant neoplasm of breast in female, estrogen receptor positive, unspecified site of breast ([place])  C50.911    Z17.0    C50.912     SUMMARY OF ONCOLOGIC HISTORY: Oncology History  Bilateral breast cancer ([place])  2009 Initial Diagnosis   Left breast cancer 2009: Grade 2 IDC 1.3 cm, triple negative, 1/5 lymph node micrometastases, adjuvant radiation, 2 cycles of Taxol and carboplatin discontinued for sepsis   2019 Relapse/Recurrence   Right breast cancer 07/2018: Status post lumpectomy 08/19/2018: Invasive papillary cancer 1.2 cm T1CN0 ER/PR positive HER-2 negative status post radiation completed 10/26/2018 [facility])   11/15/2018 -  Anti-estrogen oral therapy   Anastrozole     CHIEF COMPLIANT: Follow-up of left breast cancer on anastrozole, complaining of purple discoloration of the left breast  INTERVAL HISTORY: Sheila Blair is a 85 y.o. with above-mentioned history of left breast cancer in 2009 and right breast cancer in 2019 who is currently on antiestrogen therapy with anastrozole. She presents to the clinic today for follow-up.  She is tolerating anastrozole extremely well without any problems or concerns.  She is here today complaining of marked discoloration of the left breast that started a week ago.  It was on the upper and outer aspect of the breast but apparently over the past 24 hours it has markedly improved and there is very little discoloration at this time.  It is not associate with any pain or discomfort or itching.  Denies any lumps or nodules.   ALLERGIES:  is allergic to codeine and morphine and related.  MEDICATIONS:  Current Outpatient Medications  Medication Sig Dispense Refill  . anastrozole (ARIMIDEX) 1 MG tablet Take 1 tablet (1 mg total) by mouth daily. 90 tablet 3  . atorvastatin (LIPITOR) 10 MG tablet Take 10 mg by mouth at  bedtime.    . citalopram (CELEXA) 20 MG tablet citalopram 20 mg tablet    . diltiazem (CARDIZEM CD) 180 MG 24 hr capsule Take 180 mg by mouth 2 (two) times daily.    Marland Kitchen diltiazem (TIAZAC) 180 MG 24 hr capsule Take by mouth.    . docusate sodium (COLACE) 100 MG capsule Take 100 mg by mouth 2 (two) times daily.    . hydrochlorothiazide (HYDRODIURIL) 25 MG tablet Take 25 mg by mouth daily.    Marland Kitchen latanoprost (XALATAN) 0.005 % ophthalmic solution Place 1 drop into the right eye at bedtime.    Marland Kitchen losartan (COZAAR) 100 MG tablet Take 100 mg by mouth daily.    . metoprolol (LOPRESSOR) 100 MG tablet Take 100 mg by mouth 2 (two) times daily.    . mupirocin cream (BACTROBAN) 2 % Apply 1 application topically daily. 22 g 1  . olmesartan (BENICAR) 20 MG tablet olmesartan 20 mg tablet    . Polyethyl Glycol-Propyl Glycol (SYSTANE) 0.4-0.3 % SOLN Place 1 drop into both eyes 2 (two) times daily as needed.    Marland Kitchen PRADAXA 150 MG CAPS capsule Take 150 mg by mouth 2 (two) times daily.    . traMADol (ULTRAM) 50 MG tablet Take 50 mg by mouth 3 (three) times daily as needed.     No current facility-administered medications for this visit.    PHYSICAL EXAMINATION: ECOG PERFORMANCE STATUS: 1 - Symptomatic but completely ambulatory  Vitals:   10/10/20 1204  BP: 135/68  Pulse: 65  Resp: 17  Temp: (!)  97.3 F (36.3 C)  SpO2: 93%   Filed Weights   10/10/20 1204  Weight: 202 lb 14.4 oz (92 kg)    BREAST: Slight discoloration of the left breast is noted but it is not as severe as she described.  She tells me that it had improved over the past 24 hours.. (exam performed in the presence of a chaperone)  LABORATORY DATA:  I have reviewed the data as listed CMP Latest Ref Rng & Units 07/17/2013 03/01/2011 01/09/2008  Glucose 70 - 99 mg/dL 144(H) 213(H) 149(H)  BUN 6 - 23 mg/dL 24(H) 28(H) 17  Creatinine 0.50 - 1.10 mg/dL 0.95 1.57(H) 0.80  Sodium 135 - 145 mEq/L 138 133(L) 136  Potassium 3.5 - 5.1 mEq/L 3.9 3.9  3.3(L)  Chloride 96 - 112 mEq/L 97 88(L) 101  CO2 19 - 32 mEq/L _0 Calcium 8.4 - 10.5 mg/dL 9.0 11.9(H) 8.8  Total Protein 6.0 - 8.3 g/dL - 8.4(H) -  Total Bilirubin 0.3 - 1.2 mg/dL - 0.7 -  Alkaline Phos 39 - 117 U/L - 94 -  AST 0 - 37 U/L - 31 -  ALT 0 - 35 U/L - 26 -    Lab Results  Component Value Date   WBC 8.5 07/17/2013   HGB 14.1 07/17/2013   HCT 40.8 07/17/2013   MCV 92.9 07/17/2013   PLT 226 07/17/2013   NEUTROABS 7.1 07/17/2013    ASSESSMENT & PLAN:  Bilateral breast cancer ([place]) History of bilateral breast cancers Left breast cancer 2009: Grade 2 IDC 1.3 cm, triple negative, 1/5 lymph node micrometastases, adjuvant radiation, 2 cycles of Taxol and carboplatin discontinued for sepsis Right breast cancer 07/2018: Status post lumpectomy 08/19/2018: Invasive papillary cancer 1.2 cm T1CN0 ER/PR positive HER-2 negative status post radiation completed 10/26/2018  Current treatment: Anastrozole started 11/15/2018  Discoloration of the left breast: No palpable lumps or nodules.  The discoloration has faded away.  It could be related to current cold weather.  Instructed her to apply moisturizing creams regularly.   Breast cancer surveillance: 1.  Breast exam 07/30/2020: Benign 2. Mammogram done in April 2021 at [facility]: Benign we will set up the next mammogram in April 2022. 3.  Bone density 12/01/2018: T score -1.4: Osteopenia  She will return back to see [place] in April at her regularly scheduled appointment.    No orders of the defined types were placed in this encounter.  The patient has a good understanding of the overall plan. she agrees with it. she will call with any problems that may develop before the next visit here.  Total time spent: 20 mins including face to face time and time spent for planning, charting and coordination of care  Nicholas Lose, MD 10/10/2020  I, Cloyde Reams Dorshimer, am acting as scribe for Dr. Nicholas Lose.  I have reviewed  the above documentation for accuracy and completeness, and I agree with the above.

## 2020-10-10 NOTE — Assessment & Plan Note (Signed)
History of bilateral breast cancers Left breast cancer 2009: Grade 2 IDC 1.3 cm, triple negative, 1/5 lymph node micrometastases, adjuvant radiation, 2 cycles of Taxol and carboplatin discontinued for sepsis Right breast cancer 07/2018: Status post lumpectomy 08/19/2018: Invasive papillary cancer 1.2 cm T1CN0 ER/PR positive HER-2 negative status post radiation completed 10/26/2018  Current treatment: Anastrozole started 11/15/2018  Discoloration of the left breast: No palpable lumps or nodules.  The discoloration has faded away.  It could be related to current cold weather.  Instructed her to apply moisturizing creams regularly.   Breast cancer surveillance: 1.  Breast exam 07/30/2020: Benign 2. Mammogram done in April 2021 at [facility]: Benign we will set up the next mammogram in April 2022. 3.  Bone density 12/01/2018: T score -1.4: Osteopenia  She will return back to see [place] in April at her regularly scheduled appointment.

## 2020-10-11 DIAGNOSIS — I4821 Permanent atrial fibrillation: Secondary | ICD-10-CM | POA: Diagnosis not present

## 2020-10-11 DIAGNOSIS — E1122 Type 2 diabetes mellitus with diabetic chronic kidney disease: Secondary | ICD-10-CM | POA: Diagnosis not present

## 2020-10-11 DIAGNOSIS — M1990 Unspecified osteoarthritis, unspecified site: Secondary | ICD-10-CM | POA: Diagnosis not present

## 2020-10-11 DIAGNOSIS — N390 Urinary tract infection, site not specified: Secondary | ICD-10-CM | POA: Diagnosis not present

## 2020-10-11 DIAGNOSIS — R7309 Other abnormal glucose: Secondary | ICD-10-CM | POA: Diagnosis not present

## 2020-10-12 ENCOUNTER — Telehealth: Payer: Self-pay | Admitting: Hematology and Oncology

## 2020-10-12 NOTE — Telephone Encounter (Signed)
No 1/5 los, no changes made to pt schedule  

## 2020-10-18 DIAGNOSIS — G4733 Obstructive sleep apnea (adult) (pediatric): Secondary | ICD-10-CM | POA: Diagnosis not present

## 2020-10-30 DIAGNOSIS — G4733 Obstructive sleep apnea (adult) (pediatric): Secondary | ICD-10-CM | POA: Diagnosis not present

## 2020-11-22 NOTE — Progress Notes (Signed)
DUE TO COVID-19 ONLY ONE VISITOR IS ALLOWED TO COME WITH YOU AND STAY IN THE WAITING ROOM ONLY DURING PRE OP AND PROCEDURE DAY OF SURGERY. THE 1 VISITOR  MAY VISIT WITH YOU AFTER SURGERY IN YOUR PRIVATE ROOM DURING VISITING HOURS ONLY!  YOU NEED TO HAVE A COVID 19 TEST ON___2/26/2022 ____ @_______ , THIS TEST MUST BE DONE BEFORE SURGERY,  COVID TESTING SITE [address] [place] [address], IT IS ON THE RIGHT GOING OUT [address] APPROXIMATELY  2 MINUTES PAST [employer] ON THE RIGHT. ONCE YOUR COVID TEST IS COMPLETED,  PLEASE BEGIN THE QUARANTINE INSTRUCTIONS AS OUTLINED IN YOUR HANDOUT.                Sheila Blair  11/22/2020   Your procedure is scheduled on: 12/05/2020    Report to [facility] Main  Entrance   Report to admitting at     0920am  AM     Call this number if you have problems the morning of surgery [phone]    REMEMBER: NO  SOLID FOOD CANDY OR GUM AFTER MIDNIGHT. CLEAR LIQUIDS UNTIL 0845am         . NOTHING BY MOUTH EXCEPT CLEAR LIQUIDS UNTIL    . PLEASE FINISH ENSURE DRINK PER SURGEON ORDER  WHICH NEEDS TO BE COMPLETED AT 0845am    .      CLEAR LIQUID DIET   Foods Allowed                                                                    Coffee and tea, regular and decaf                            Fruit ices (not with fruit pulp)                                      Iced Popsicles                                    Carbonated beverages, regular and diet                                    Cranberry, grape and apple juices Sports drinks like Gatorade Lightly seasoned clear broth or consume(fat free) Sugar, honey syrup ___________________________________________________________________      BRUSH YOUR TEETH MORNING OF SURGERY AND RINSE YOUR MOUTH OUT, NO CHEWING GUM CANDY OR MINTS.     Take these medicines the morning of surgery with A SIP OF WATER: armidex, citalopram, cardizem, metoprolol   DO NOT TAKE ANY DIABETIC  MEDICATIONS DAY OF YOUR SURGERY                               You may not have any metal on your body including hair pins and              piercings  Do not wear jewelry, make-up, lotions, powders or perfumes, deodorant             Do not wear nail polish on your fingernails.  Do not shave  48 hours prior to surgery.              Men may shave face and neck.   Do not bring valuables to the hospital. [place].  Contacts, dentures or bridgework may not be worn into surgery.  Leave suitcase in the car. After surgery it may be brought to your room.     Patients discharged the day of surgery will not be allowed to drive home. IF YOU ARE HAVING SURGERY AND GOING HOME THE SAME DAY, YOU MUST HAVE AN ADULT TO DRIVE YOU HOME AND BE WITH YOU FOR 24 HOURS. YOU MAY GO HOME BY TAXI OR UBER OR ORTHERWISE, BUT AN ADULT MUST ACCOMPANY YOU HOME AND STAY WITH YOU FOR 24 HOURS.  Name and phone number of your driver:  Special Instructions: N/A              Please read over the following fact sheets you were given: _____________________________________________________________________  [facility] - Preparing for Surgery Before surgery, you can play an important role.  Because skin is not sterile, your skin needs to be as free of germs as possible.  You can reduce the number of germs on your skin by washing with CHG (chlorahexidine gluconate) soap before surgery.  CHG is an antiseptic cleaner which kills germs and bonds with the skin to continue killing germs even after washing. Please DO NOT use if you have an allergy to CHG or antibacterial soaps.  If your skin becomes reddened/irritated stop using the CHG and inform your nurse when you arrive at Short Stay. Do not shave (including legs and underarms) for at least 48 hours prior to the first CHG shower.  You may shave your face/neck. Please follow these instructions carefully:  1.  Shower with CHG Soap the night  before surgery and the  morning of Surgery.  2.  If you choose to wash your hair, wash your hair first as usual with your  normal  shampoo.  3.  After you shampoo, rinse your hair and body thoroughly to remove the  shampoo.                           4.  Use CHG as you would any other liquid soap.  You can apply chg directly  to the skin and wash                       Gently with a scrungie or clean washcloth.  5.  Apply the CHG Soap to your body ONLY FROM THE NECK DOWN.   Do not use on face/ open                           Wound or open sores. Avoid contact with eyes, ears mouth and genitals (private parts).                       Wash face,  Genitals (private parts) with your normal soap.             6.  Wash  thoroughly, paying special attention to the area where your surgery  will be performed.  7.  Thoroughly rinse your body with warm water from the neck down.  8.  DO NOT shower/wash with your normal soap after using and rinsing off  the CHG Soap.                9.  Pat yourself dry with a clean towel.            10.  Wear clean pajamas.            11.  Place clean sheets on your bed the night of your first shower and do not  sleep with pets. Day of Surgery : Do not apply any lotions/deodorants the morning of surgery.  Please wear clean clothes to the hospital/surgery center.  FAILURE TO FOLLOW THESE INSTRUCTIONS MAY RESULT IN THE CANCELLATION OF YOUR SURGERY PATIENT SIGNATURE_________________________________  NURSE SIGNATURE__________________________________  ________________________________________________________________________

## 2020-11-22 NOTE — Progress Notes (Addendum)
Anesthesia Review:  PCP: DR Asencion Noble Clearance- 10/11/20 on chart  ekg on chart from 12/29/19 from Dr Willey Blade office  [place] 10/11/20 on chart   Cardiologist : not followed by a cardiologist  Hx of atrial fib  Chest x-ray : EKG : 11/26/20  Echo : Stress test: Cardiac Cath :  Activity level:  Can do a flight of stairs without difficulty  Sleep Study/ CPAP : cap  Fasting Blood Sugar :      / Checks Blood Sugar -- times a day:   Blood Thinner/ Instructions /Last Dose: ASA / Instructions/ Last Dose :Pradaxa DM- type 2 on no meds  Does not check glucose at home  hgba1c-10/30/20- 6.2  On Pradaxa- aost dose 12/03/20 per pt  Pt reports hx of small airway but reports with 17 surgeries she has had at [place] has never had any issues per pt  Pt,PTT done 11/26/20 routed to DR Aluisio.

## 2020-11-26 ENCOUNTER — Encounter (HOSPITAL_COMMUNITY)
Admission: RE | Admit: 2020-11-26 | Discharge: 2020-11-26 | Disposition: A | Discharge status: Home / Self Care | Payer: Medicare PPO | Source: Ambulatory Visit | Attending: Orthopedic Surgery | Admitting: Orthopedic Surgery

## 2020-11-26 ENCOUNTER — Other Ambulatory Visit: Payer: Self-pay

## 2020-11-26 ENCOUNTER — Encounter (HOSPITAL_COMMUNITY): Payer: Self-pay

## 2020-11-26 DIAGNOSIS — I10 Essential (primary) hypertension: Secondary | ICD-10-CM | POA: Diagnosis not present

## 2020-11-26 DIAGNOSIS — C50911 Malignant neoplasm of unspecified site of right female breast: Secondary | ICD-10-CM | POA: Insufficient documentation

## 2020-11-26 DIAGNOSIS — Z01818 Encounter for other preprocedural examination: Secondary | ICD-10-CM | POA: Diagnosis not present

## 2020-11-26 DIAGNOSIS — Z90710 Acquired absence of both cervix and uterus: Secondary | ICD-10-CM | POA: Insufficient documentation

## 2020-11-26 DIAGNOSIS — M1611 Unilateral primary osteoarthritis, right hip: Secondary | ICD-10-CM | POA: Insufficient documentation

## 2020-11-26 DIAGNOSIS — I4891 Unspecified atrial fibrillation: Secondary | ICD-10-CM | POA: Insufficient documentation

## 2020-11-26 DIAGNOSIS — Z79899 Other long term (current) drug therapy: Secondary | ICD-10-CM | POA: Diagnosis not present

## 2020-11-26 DIAGNOSIS — E119 Type 2 diabetes mellitus without complications: Secondary | ICD-10-CM | POA: Insufficient documentation

## 2020-11-26 DIAGNOSIS — Z79891 Long term (current) use of opiate analgesic: Secondary | ICD-10-CM | POA: Diagnosis not present

## 2020-11-26 DIAGNOSIS — C50912 Malignant neoplasm of unspecified site of left female breast: Secondary | ICD-10-CM | POA: Diagnosis not present

## 2020-11-26 HISTORY — DX: Unspecified osteoarthritis, unspecified site: M19.90

## 2020-11-26 HISTORY — DX: Cardiac arrhythmia, unspecified: I49.9

## 2020-11-26 HISTORY — DX: Other complications of anesthesia, initial encounter: T88.59XA

## 2020-11-26 HISTORY — DX: Failed or difficult intubation, initial encounter: T88.4XXA

## 2020-11-26 LAB — SURGICAL PCR SCREEN
MRSA, PCR: NEGATIVE
Staphylococcus aureus: NEGATIVE

## 2020-11-26 LAB — COMPREHENSIVE METABOLIC PANEL
ALT: 28 U/L (ref 0–44)
AST: 30 U/L (ref 15–41)
Albumin: 4.1 g/dL (ref 3.5–5.0)
Alkaline Phosphatase: 76 U/L (ref 38–126)
Anion gap: 12 (ref 5–15)
BUN: 14 mg/dL (ref 8–23)
CO2: 28 mmol/L (ref 22–32)
Calcium: 9.4 mg/dL (ref 8.9–10.3)
Chloride: 98 mmol/L (ref 98–111)
Creatinine, Ser: 0.85 mg/dL (ref 0.44–1.00)
GFR, Estimated: >60 mL/min (ref >60)
Glucose, Bld: 119 mg/dL — ABNORMAL HIGH (ref 70–99)
Potassium: 3.9 mmol/L (ref 3.5–5.1)
Sodium: 138 mmol/L (ref 135–145)
Total Bilirubin: 1.3 mg/dL — ABNORMAL HIGH (ref 0.3–1.2)
Total Protein: 7.1 g/dL (ref 6.5–8.1)

## 2020-11-26 LAB — PROTIME-INR
INR: 1.4 — ABNORMAL HIGH (ref 0.8–1.2)
Prothrombin Time: 16.3 seconds — ABNORMAL HIGH (ref 11.4–15.2)

## 2020-11-26 LAB — CBC
HCT: 45.6 % (ref 36.0–46.0)
Hemoglobin: 15 g/dL (ref 12.0–15.0)
MCH: 31.2 pg (ref 26.0–34.0)
MCHC: 32.9 g/dL (ref 30.0–36.0)
MCV: 94.8 fL (ref 80.0–100.0)
Platelets: 203 10*3/uL (ref 150–400)
RBC: 4.81 MIL/uL (ref 3.87–5.11)
RDW: 14 % (ref 11.5–15.5)
WBC: 6.1 10*3/uL (ref 4.0–10.5)
nRBC: 0 % (ref 0.0–0.2)

## 2020-11-26 LAB — APTT: aPTT: 45 seconds — ABNORMAL HIGH (ref 24–36)

## 2020-11-29 NOTE — Progress Notes (Signed)
Anesthesia Chart Review   Case: [id number] Date/Time: 12/05/20 1135   Procedure: TOTAL HIP ARTHROPLASTY ANTERIOR APPROACH (Right Hip) - 190min   Anesthesia type: Choice   Pre-op diagnosis: right hip osteoarthritis   Location: [facility] ROOM 10 / [facility]   Surgeons: Gaynelle Arabian, MD      DISCUSSION:84 y.o. never smoker with h/o HTN, DM II, a-fib, bilateral breast cancer s/p radiation currently under surveillance, sleep apnea, right hip OA scheduled for above procedure 12/05/20 with Dr. Gaynelle Arabian.   Last seen by PCP 10/11/20 for preoperative evaluation, notes on chart. Clearance received from PCP which states pt is low risk for planned procedure and may hold pradaxa 2 days prior to procedure.    Repeat PT/PTT DOS. VS: BP (!) 161/85   Pulse (!) 104   Temp 36.9 C (Oral)   Resp 16   SpO2 97%   PROVIDERS: Asencion Noble, MD is PCP    LABS: Labs reviewed: Acceptable for surgery. (all labs ordered are listed, but only abnormal results are displayed)  Labs Reviewed  COMPREHENSIVE METABOLIC PANEL - Abnormal; Notable for the following components:      Result Value   Glucose, Bld 119 (*)    Total Bilirubin 1.3 (*)    All other components within normal limits  PROTIME-INR - Abnormal; Notable for the following components:   Prothrombin Time 16.3 (*)    INR 1.4 (*)    All other components within normal limits  APTT - Abnormal; Notable for the following components:   aPTT 45 (*)    All other components within normal limits  SURGICAL PCR SCREEN  CBC  TYPE AND SCREEN     IMAGES:   EKG: 11/26/20 Rate 60 bpm  Atrial fibrillation Septal infarct , age undetermined Abnormal ECG Compared to 03/01/2011, atrial fibrillation is better rate controlled.  CV:  Past Medical History:  Diagnosis Date  . Arthritis   . Atrial fibrillation ([place])   . Atypical nevus 09/21/2013   Mild-Left tricep  . Breast cancer ([place])    BILATERAL   . Complication of anesthesia   . Diabetes mellitus without  complication ([place])    TYPE 2   . Difficult intubation    PT REPORTS NARROW AIRWAY BUT  HAS HAD 17 SURGERIES WITHOUT A PROBLEM - SURGERIES AT [facility] AND AT [facility] PER PT   . Dysrhythmia    AFIB   . Hypertension   . SCC (squamous cell carcinoma) 12/19/2019   right lower leg anterior-txpbx  . SCCA (squamous cell carcinoma) of skin 04/06/2017   RIGHT UPPER ARM-CX3,5FU  . Sleep apnea    CPAP     Past Surgical History:  Procedure Laterality Date  . ABDOMINAL HYSTERECTOMY    . ABDOMINAL SURGERY    . APPENDECTOMY    . benign thyroid tumor    . BREAST LUMPECTOMY    . BREAST SURGERY    . CATARACT EXTRACTION    . CHOLECYSTECTOMY    . COLONOSCOPY N/A 07/10/2016   Procedure: COLONOSCOPY;  Surgeon: Rogene Houston, MD;  Location[facility];  Service: Endoscopy;  Laterality: N/A;  930 - moved to 11/2 @ 9:30  . EYE SURGERY  2015   to correct drooping eye lids  . HERNIA REPAIR    . pseudoptosis Bilateral   . rectocyle surgery    . SMALL INTESTINE SURGERY    . VEIN LIGATION Right     MEDICATIONS: . anastrozole (ARIMIDEX) 1 MG tablet  . atorvastatin (LIPITOR) 10  MG tablet  . Biotin 1000 MCG tablet  . calcium carbonate (OS-CAL - DOSED IN MG OF ELEMENTAL CALCIUM) 1250 (500 Ca) MG tablet  . cholecalciferol (VITAMIN D3) 25 MCG (1000 UNIT) tablet  . citalopram (CELEXA) 20 MG tablet  . diltiazem (CARDIZEM CD) 180 MG 24 hr capsule  . docusate sodium (COLACE) 100 MG capsule  . hydrochlorothiazide (HYDRODIURIL) 25 MG tablet  . latanoprost (XALATAN) 0.005 % ophthalmic solution  . metoprolol (LOPRESSOR) 100 MG tablet  . olmesartan (BENICAR) 20 MG tablet  . Polyethyl Glycol-Propyl Glycol 0.4-0.3 % SOLN  . PRADAXA 150 MG CAPS capsule  . traMADol (ULTRAM) 50 MG tablet   No current facility-administered medications for this encounter.   Konrad Felix, PA-C [facility] Pre-Surgical Testing [phone]

## 2020-11-30 DIAGNOSIS — G4733 Obstructive sleep apnea (adult) (pediatric): Secondary | ICD-10-CM | POA: Diagnosis not present

## 2020-12-01 ENCOUNTER — Other Ambulatory Visit (HOSPITAL_COMMUNITY)
Admission: RE | Admit: 2020-12-01 | Discharge: 2020-12-01 | Disposition: A | Discharge status: Home / Self Care | Payer: Medicare PPO | Source: Ambulatory Visit | Attending: Orthopedic Surgery | Admitting: Orthopedic Surgery

## 2020-12-01 DIAGNOSIS — Z20822 Contact with and (suspected) exposure to covid-19: Secondary | ICD-10-CM | POA: Insufficient documentation

## 2020-12-01 DIAGNOSIS — Z01812 Encounter for preprocedural laboratory examination: Secondary | ICD-10-CM | POA: Insufficient documentation

## 2020-12-01 LAB — SARS CORONAVIRUS 2 (TAT 6-24 HRS): SARS Coronavirus 2: NEGATIVE

## 2020-12-03 NOTE — H&P (Signed)
TOTAL HIP ADMISSION H&P  Patient is admitted for right total hip arthroplasty.  Subjective:  Chief Complaint: Right hip pain  HPI: Sheila Blair, 85 y.o. female, has a history of pain and functional disability in the right hip due to arthritis and patient has failed non-surgical conservative treatments for greater than 12 weeks to include corticosteriod injections and activity modification. Onset of symptoms was gradual, starting several years ago with gradually worsening course since that time. The patient noted no past surgery on the right hip. Patient currently rates pain in the right hip at 8 out of 10 with activity. Patient has worsening of pain with activity and weight bearing, pain that interfers with activities of daily living, pain with passive range of motion and crepitus. Patient has evidence of severe end-stage arthritis of the right hip, bone-on-bone throughout, with some subchondral cysts by imaging studies. This condition presents safety issues increasing the risk of falls. There is no current active infection.  Patient Active Problem List   Diagnosis Date Noted  . Bilateral breast cancer ([place]) 07/30/2020  . Obstructive sleep apnea 07/19/2015  . Hypercholesterolemia 09/08/2013  . Hypertension 09/08/2013    Past Medical History:  Diagnosis Date  . Arthritis   . Atrial fibrillation ([place])   . Atypical nevus 09/21/2013   Mild-Left tricep  . Breast cancer ([place])    BILATERAL   . Complication of anesthesia   . Diabetes mellitus without complication ([place])    TYPE 2   . Difficult intubation    PT REPORTS NARROW AIRWAY BUT  HAS HAD 17 SURGERIES WITHOUT A PROBLEM - SURGERIES AT [facility] AND AT [facility] PER PT   . Dysrhythmia    AFIB   . Hypertension   . SCC (squamous cell carcinoma) 12/19/2019   right lower leg anterior-txpbx  . SCCA (squamous cell carcinoma) of skin 04/06/2017   RIGHT UPPER ARM-CX3,5FU  . Sleep apnea    CPAP     Past Surgical History:  Procedure  Laterality Date  . ABDOMINAL HYSTERECTOMY    . ABDOMINAL SURGERY    . APPENDECTOMY    . benign thyroid tumor    . BREAST LUMPECTOMY    . BREAST SURGERY    . CATARACT EXTRACTION    . CHOLECYSTECTOMY    . COLONOSCOPY N/A 07/10/2016   Procedure: COLONOSCOPY;  Surgeon: Rogene Houston, MD;  [facility];  Service: Endoscopy;  Laterality: N/A;  930 - moved to 11/2 @ 9:30  . EYE SURGERY  2015   to correct drooping eye lids  . HERNIA REPAIR    . pseudoptosis Bilateral   . rectocyle surgery    . SMALL INTESTINE SURGERY    . VEIN LIGATION Right     Prior to Admission medications   Medication Sig Start Date End Date Taking? Authorizing Provider  anastrozole (ARIMIDEX) 1 MG tablet Take 1 tablet (1 mg total) by mouth daily. 08/14/20  Yes Nicholas Lose, MD  atorvastatin (LIPITOR) 10 MG tablet Take 10 mg by mouth at bedtime. 05/30/13  Yes [provider]  Biotin 1000 MCG tablet Take 1,000 mcg by mouth daily.   Yes [provider]  calcium carbonate (OS-CAL - DOSED IN MG OF ELEMENTAL CALCIUM) 1250 (500 Ca) MG tablet Take 1 tablet by mouth daily with breakfast.   Yes [provider]  cholecalciferol (VITAMIN D3) 25 MCG (1000 UNIT) tablet Take 1,000 Units by mouth daily.   Yes [provider]  citalopram (CELEXA) 20 MG tablet Take  20 mg by mouth daily.   Yes [provider]  diltiazem (CARDIZEM CD) 180 MG 24 hr capsule Take 180 mg by mouth in the morning and at bedtime. 07/02/13  Yes [provider]  docusate sodium (COLACE) 100 MG capsule Take 100 mg by mouth 2 (two) times daily.   Yes [provider]  hydrochlorothiazide (HYDRODIURIL) 25 MG tablet Take 25 mg by mouth daily. 06/24/13  Yes [provider]  latanoprost (XALATAN) 0.005 % ophthalmic solution Place 1 drop into the right eye at bedtime. 01/12/20  Yes [provider]  metoprolol (LOPRESSOR) 100 MG tablet Take 100 mg by mouth 2 (two) times daily. 07/16/13   Yes [provider]  olmesartan (BENICAR) 20 MG tablet Take 20 mg by mouth daily.   Yes [provider]  Polyethyl Glycol-Propyl Glycol 0.4-0.3 % SOLN Place 1 drop into both eyes 2 (two) times daily as needed (dry eyes).   Yes [provider]  PRADAXA 150 MG CAPS capsule Take 150 mg by mouth 2 (two) times daily. 07/14/13  Yes [provider]  traMADol (ULTRAM) 50 MG tablet Take 50 mg by mouth 3 (three) times daily as needed for severe pain. 06/21/13  Yes [provider]    Allergies  Allergen Reactions  . Codeine Shortness Of Breath  . Morphine And Related Shortness Of Breath    Social History   Socioeconomic History  . Marital status: Married    Spouse name: Not on file  . Number of children: Not on file  . Years of education: Not on file  . Highest education level: Not on file  Occupational History  . Not on file  Tobacco Use  . Smoking status: Never Smoker  . Smokeless tobacco: Never Used  Vaping Use  . Vaping Use: Never used  Substance and Sexual Activity  . Alcohol use: No  . Drug use: No  . Sexual activity: Not on file  Other Topics Concern  . Not on file  Social History Narrative  . Not on file   Social Determinants of Health   Financial Resource Strain: Not on file  Food Insecurity: Not on file  Transportation Needs: Not on file  Physical Activity: Not on file  Stress: Not on file  Social Connections: Not on file  Intimate Partner Violence: Not on file    Tobacco Use: Low Risk   . Smoking Tobacco Use: Never Smoker  . Smokeless Tobacco Use: Never Used   Social History   Substance and Sexual Activity  Alcohol Use No    Family History  Problem Relation Age of Onset  . Heart disease Mother   . Dementia Sister     Review of Systems  Constitutional: Negative for chills and fever.  HENT: Negative for congestion, sore throat and tinnitus.   Eyes: Negative for double vision, photophobia and pain.   Respiratory: Negative for cough, shortness of breath and wheezing.   Cardiovascular: Negative for chest pain, palpitations and orthopnea.  Gastrointestinal: Negative for heartburn, nausea and vomiting.  Genitourinary: Negative for dysuria, frequency and urgency.  Musculoskeletal: Positive for joint pain.  Neurological: Negative for dizziness, weakness and headaches.     Objective:  Physical Exam: Well nourished and well developed.  General: Alert and oriented x3, cooperative and pleasant, no acute distress.  Head: normocephalic, atraumatic, neck supple.  Eyes: EOMI.  Respiratory: breath sounds clear in all fields, no wheezing, rales, or rhonchi. Cardiovascular: Regular rate and rhythm, no murmurs, gallops or rubs.  Abdomen: non-tender to palpation and soft, normoactive bowel sounds. Musculoskeletal:   Right Hip Exam:  ROM: Flexion to 100, Internal Rotation 0, External Rotation 20, and abduction 20 without discomfort.  There is no tenderness over the greater trochanter bursa.   Calves soft and nontender. Motor function intact in LE. Strength 5/5 LE bilaterally. Neuro: Distal pulses 2+. Sensation to light touch intact in LE.  Imaging Review Plain radiographs demonstrate severe degenerative joint disease of the right hip. The bone quality appears to be adequate for age and reported activity level.  Assessment/Plan:  End stage arthritis, right hip  The patient history, physical examination, clinical judgement of the provider and imaging studies are consistent with end stage degenerative joint disease of the right hip and total hip arthroplasty is deemed medically necessary. The treatment options including medical management, injection therapy, arthroscopy and arthroplasty were discussed at length. The risks and benefits of total hip arthroplasty were presented and reviewed. The risks due to aseptic loosening, infection, stiffness, dislocation/subluxation, thromboembolic complications  and other imponderables were discussed. The patient acknowledged the explanation, agreed to proceed with the plan and consent was signed. Patient is being admitted for inpatient treatment for surgery, pain control, PT, OT, prophylactic antibiotics, VTE prophylaxis, progressive ambulation and ADLs and discharge planning.The patient is planning to be discharged home.   Patient's anticipated LOS is less than 2 midnights, meeting these requirements: - Younger than 51 - Lives within 1 hour of care - Has a competent adult at home to recover with post-op recover - NO history of  - Chronic pain requiring opiods  - Diabetes  - Coronary Artery Disease  - Heart failure  - Heart attack  - Stroke  - DVT/VTE  - Cardiac arrhythmia  - Respiratory Failure/COPD  - Renal failure  - Anemia  - Advanced Liver disease  Therapy Plans: HEP Disposition: Home with daughter Planned DVT Prophylaxis: Pradaxa 150 mg BID DME Needed: [facility] Rile PCP: Asencion Noble, MD (clearance received) TXA: IV Allergies: Codeine Anesthesia Concerns: Difficulty with intubation (small airway) BMI: 32.8 Last HgbA1c: 6.0 Pharmacy: [facility]   Other:  - Stopping pradaxa 2 days prior to surgery per Dr. Willey Blade - Hx atrial fibrillation, breast CA - Tolerates hydrocodone  - Patient was instructed on what medications to stop prior to surgery. - Follow-up visit in 2 weeks with Dr. Wynelle Link - Begin physical therapy following surgery - Pre-operative lab work as pre-surgical testing - Prescriptions will be provided in hospital at time of discharge  Theresa Duty, PA-C Orthopedic Surgery [facility] [place]

## 2020-12-03 NOTE — Progress Notes (Signed)
Patient called to say she could not find the G2 lower sugar gatorade no where in [place].  Instructed pt to drink plenty of clear liquids up untl 3 hours prior to surgery.  Pt voiced understanding.

## 2020-12-05 ENCOUNTER — Ambulatory Visit (HOSPITAL_COMMUNITY): Payer: Medicare PPO | Admitting: Physician Assistant

## 2020-12-05 ENCOUNTER — Observation Stay (HOSPITAL_COMMUNITY)
Admission: RE | Admit: 2020-12-05 | Discharge: 2020-12-06 | Disposition: A | Discharge status: Home / Self Care | Payer: Medicare PPO | Attending: Orthopedic Surgery | Admitting: Orthopedic Surgery

## 2020-12-05 ENCOUNTER — Observation Stay (HOSPITAL_COMMUNITY): Payer: Medicare PPO

## 2020-12-05 ENCOUNTER — Ambulatory Visit (HOSPITAL_COMMUNITY): Payer: Medicare PPO

## 2020-12-05 ENCOUNTER — Other Ambulatory Visit: Payer: Self-pay

## 2020-12-05 ENCOUNTER — Encounter (HOSPITAL_COMMUNITY): Payer: Self-pay | Admitting: Orthopedic Surgery

## 2020-12-05 ENCOUNTER — Ambulatory Visit (HOSPITAL_COMMUNITY): Payer: Medicare PPO | Admitting: Anesthesiology

## 2020-12-05 ENCOUNTER — Encounter (HOSPITAL_COMMUNITY)
Admission: RE | Disposition: A | Discharge status: Home / Self Care | Payer: Self-pay | Source: Home / Self Care | Attending: Orthopedic Surgery

## 2020-12-05 DIAGNOSIS — E7800 Pure hypercholesterolemia, unspecified: Secondary | ICD-10-CM | POA: Insufficient documentation

## 2020-12-05 DIAGNOSIS — Z471 Aftercare following joint replacement surgery: Secondary | ICD-10-CM | POA: Diagnosis not present

## 2020-12-05 DIAGNOSIS — I10 Essential (primary) hypertension: Secondary | ICD-10-CM | POA: Insufficient documentation

## 2020-12-05 DIAGNOSIS — Z96649 Presence of unspecified artificial hip joint: Secondary | ICD-10-CM

## 2020-12-05 DIAGNOSIS — M169 Osteoarthritis of hip, unspecified: Secondary | ICD-10-CM | POA: Diagnosis present

## 2020-12-05 DIAGNOSIS — Z9989 Dependence on other enabling machines and devices: Secondary | ICD-10-CM | POA: Diagnosis not present

## 2020-12-05 DIAGNOSIS — Z79899 Other long term (current) drug therapy: Secondary | ICD-10-CM | POA: Insufficient documentation

## 2020-12-05 DIAGNOSIS — I4891 Unspecified atrial fibrillation: Secondary | ICD-10-CM | POA: Diagnosis not present

## 2020-12-05 DIAGNOSIS — M1611 Unilateral primary osteoarthritis, right hip: Principal | ICD-10-CM | POA: Diagnosis present

## 2020-12-05 DIAGNOSIS — M25551 Pain in right hip: Secondary | ICD-10-CM | POA: Diagnosis present

## 2020-12-05 DIAGNOSIS — Z7952 Long term (current) use of systemic steroids: Secondary | ICD-10-CM | POA: Insufficient documentation

## 2020-12-05 DIAGNOSIS — Z419 Encounter for procedure for purposes other than remedying health state, unspecified: Secondary | ICD-10-CM

## 2020-12-05 DIAGNOSIS — G4733 Obstructive sleep apnea (adult) (pediatric): Secondary | ICD-10-CM | POA: Diagnosis not present

## 2020-12-05 DIAGNOSIS — Z96641 Presence of right artificial hip joint: Secondary | ICD-10-CM | POA: Diagnosis not present

## 2020-12-05 HISTORY — PX: TOTAL HIP ARTHROPLASTY: SHX124

## 2020-12-05 LAB — TYPE AND SCREEN
ABO/RH(D): O POS
Antibody Screen: NEGATIVE

## 2020-12-05 LAB — GLUCOSE, CAPILLARY: Glucose-Capillary: 125 mg/dL — ABNORMAL HIGH (ref 70–99)

## 2020-12-05 LAB — ABO/RH: ABO/RH(D): O POS

## 2020-12-05 SURGERY — ARTHROPLASTY, HIP, TOTAL, ANTERIOR APPROACH
Anesthesia: Spinal | Site: Hip | Laterality: Right

## 2020-12-05 MED ORDER — METOPROLOL TARTRATE 50 MG PO TABS
100.0000 mg | ORAL_TABLET | Freq: Two times a day (BID) | ORAL | Status: DC
  Administered 2020-12-05: 100 mg via ORAL
  Filled 2020-12-05 (×2): qty 2

## 2020-12-05 MED ORDER — LACTATED RINGERS IV SOLN: INTRAVENOUS | Status: DC

## 2020-12-05 MED ORDER — HYDROCODONE-ACETAMINOPHEN 7.5-325 MG PO TABS: 1.0000 | ORAL_TABLET | ORAL | Status: DC | PRN

## 2020-12-05 MED ORDER — DABIGATRAN ETEXILATE MESYLATE 150 MG PO CAPS
150.0000 mg | ORAL_CAPSULE | Freq: Two times a day (BID) | ORAL | Status: DC
  Administered 2020-12-06: 150 mg via ORAL
  Filled 2020-12-05: qty 1

## 2020-12-05 MED ORDER — PROPOFOL 500 MG/50ML IV EMUL
INTRAVENOUS | Status: DC | PRN
  Administered 2020-12-05: 50 ug/kg/min via INTRAVENOUS

## 2020-12-05 MED ORDER — FENTANYL CITRATE (PF) 100 MCG/2ML IJ SOLN: 25.0000 ug | INTRAMUSCULAR | Status: DC | PRN

## 2020-12-05 MED ORDER — POLYETHYL GLYCOL-PROPYL GLYCOL 0.4-0.3 % OP SOLN: 1.0000 [drp] | Freq: Two times a day (BID) | OPHTHALMIC | Status: DC | PRN

## 2020-12-05 MED ORDER — CHLORHEXIDINE GLUCONATE CLOTH 2 % EX PADS
6.0000 | MEDICATED_PAD | Freq: Every day | CUTANEOUS | Status: DC
  Administered 2020-12-06: 6 via TOPICAL

## 2020-12-05 MED ORDER — POLYETHYLENE GLYCOL 3350 17 G PO PACK: 17.0000 g | PACK | Freq: Every day | ORAL | Status: DC | PRN

## 2020-12-05 MED ORDER — TRAMADOL HCL 50 MG PO TABS: 50.0000 mg | ORAL_TABLET | Freq: Three times a day (TID) | ORAL | Status: DC | PRN

## 2020-12-05 MED ORDER — AMISULPRIDE (ANTIEMETIC) 5 MG/2ML IV SOLN: 10.0000 mg | Freq: Once | INTRAVENOUS | Status: DC | PRN

## 2020-12-05 MED ORDER — ONDANSETRON HCL 4 MG/2ML IJ SOLN
INTRAMUSCULAR | Status: DC | PRN
  Administered 2020-12-05: 4 mg via INTRAVENOUS

## 2020-12-05 MED ORDER — METOCLOPRAMIDE HCL 5 MG/ML IJ SOLN: 5.0000 mg | Freq: Three times a day (TID) | INTRAMUSCULAR | Status: DC | PRN

## 2020-12-05 MED ORDER — PHENYLEPHRINE HCL-NACL 10-0.9 MG/250ML-% IV SOLN
INTRAVENOUS | Status: DC | PRN
  Administered 2020-12-05: 35 ug/min via INTRAVENOUS

## 2020-12-05 MED ORDER — MAGNESIUM CITRATE PO SOLN: 1.0000 | Freq: Once | ORAL | Status: DC | PRN

## 2020-12-05 MED ORDER — PROPOFOL 1000 MG/100ML IV EMUL
INTRAVENOUS | Status: AC
  Filled 2020-12-05: qty 100

## 2020-12-05 MED ORDER — MIDAZOLAM HCL 5 MG/5ML IJ SOLN
INTRAMUSCULAR | Status: DC | PRN
  Administered 2020-12-05: 1 mg via INTRAVENOUS

## 2020-12-05 MED ORDER — CELECOXIB 200 MG PO CAPS
200.0000 mg | ORAL_CAPSULE | Freq: Once | ORAL | Status: AC
  Administered 2020-12-05: 200 mg via ORAL
  Filled 2020-12-05: qty 1

## 2020-12-05 MED ORDER — METHOCARBAMOL 500 MG PO TABS
500.0000 mg | ORAL_TABLET | Freq: Four times a day (QID) | ORAL | Status: DC | PRN
  Filled 2020-12-05: qty 1

## 2020-12-05 MED ORDER — CITALOPRAM HYDROBROMIDE 20 MG PO TABS
20.0000 mg | ORAL_TABLET | Freq: Every day | ORAL | Status: DC
  Administered 2020-12-06: 20 mg via ORAL
  Filled 2020-12-05: qty 1

## 2020-12-05 MED ORDER — MORPHINE SULFATE (PF) 2 MG/ML IV SOLN: 0.5000 mg | INTRAVENOUS | Status: DC | PRN

## 2020-12-05 MED ORDER — ATORVASTATIN CALCIUM 10 MG PO TABS
10.0000 mg | ORAL_TABLET | Freq: Every day | ORAL | Status: DC
  Administered 2020-12-05: 10 mg via ORAL
  Filled 2020-12-05: qty 1

## 2020-12-05 MED ORDER — ANASTROZOLE 1 MG PO TABS
1.0000 mg | ORAL_TABLET | Freq: Every day | ORAL | Status: DC
  Administered 2020-12-06: 1 mg via ORAL
  Filled 2020-12-05: qty 1

## 2020-12-05 MED ORDER — 0.9 % SODIUM CHLORIDE (POUR BTL) OPTIME
TOPICAL | Status: DC | PRN
  Administered 2020-12-05: 1000 mL

## 2020-12-05 MED ORDER — ALBUMIN HUMAN 5 % IV SOLN: INTRAVENOUS | Status: DC | PRN

## 2020-12-05 MED ORDER — DOCUSATE SODIUM 100 MG PO CAPS: 100.0000 mg | ORAL_CAPSULE | Freq: Two times a day (BID) | ORAL | Status: DC

## 2020-12-05 MED ORDER — HYDROCHLOROTHIAZIDE 25 MG PO TABS
25.0000 mg | ORAL_TABLET | Freq: Every day | ORAL | Status: DC
  Administered 2020-12-06: 25 mg via ORAL
  Filled 2020-12-05: qty 1

## 2020-12-05 MED ORDER — GLYCOPYRROLATE 0.2 MG/ML IJ SOLN
INTRAMUSCULAR | Status: DC | PRN
  Administered 2020-12-05: .2 mg via INTRAVENOUS
  Administered 2020-12-05: .1 mg via INTRAVENOUS

## 2020-12-05 MED ORDER — PROPOFOL 10 MG/ML IV BOLUS
INTRAVENOUS | Status: AC
  Filled 2020-12-05: qty 20

## 2020-12-05 MED ORDER — FENTANYL CITRATE (PF) 100 MCG/2ML IJ SOLN
INTRAMUSCULAR | Status: DC | PRN
  Administered 2020-12-05: 50 ug via INTRAVENOUS

## 2020-12-05 MED ORDER — HYDROCODONE-ACETAMINOPHEN 5-325 MG PO TABS
1.0000 | ORAL_TABLET | ORAL | Status: DC | PRN
  Administered 2020-12-05 – 2020-12-06 (×4): 2 via ORAL
  Filled 2020-12-05 (×5): qty 2

## 2020-12-05 MED ORDER — METOCLOPRAMIDE HCL 5 MG PO TABS: 5.0000 mg | ORAL_TABLET | Freq: Three times a day (TID) | ORAL | Status: DC | PRN

## 2020-12-05 MED ORDER — DEXAMETHASONE SODIUM PHOSPHATE 10 MG/ML IJ SOLN
INTRAMUSCULAR | Status: DC | PRN
  Administered 2020-12-05: 10 mg via INTRAVENOUS

## 2020-12-05 MED ORDER — CEFAZOLIN SODIUM-DEXTROSE 2-4 GM/100ML-% IV SOLN
2.0000 g | INTRAVENOUS | Status: AC
  Administered 2020-12-05: 2 g via INTRAVENOUS
  Filled 2020-12-05: qty 100

## 2020-12-05 MED ORDER — ACETAMINOPHEN 10 MG/ML IV SOLN
1000.0000 mg | Freq: Four times a day (QID) | INTRAVENOUS | Status: DC
  Administered 2020-12-05: 1000 mg via INTRAVENOUS
  Filled 2020-12-05: qty 100

## 2020-12-05 MED ORDER — DILTIAZEM HCL ER COATED BEADS 180 MG PO CP24
180.0000 mg | ORAL_CAPSULE | Freq: Two times a day (BID) | ORAL | Status: DC
  Administered 2020-12-06: 180 mg via ORAL
  Filled 2020-12-05: qty 1

## 2020-12-05 MED ORDER — BUPIVACAINE IN DEXTROSE 0.75-8.25 % IT SOLN
INTRATHECAL | Status: DC | PRN
  Administered 2020-12-05: 1.8 mL via INTRATHECAL

## 2020-12-05 MED ORDER — LATANOPROST 0.005 % OP SOLN
1.0000 [drp] | Freq: Every day | OPHTHALMIC | Status: DC
  Administered 2020-12-05: 1 [drp] via OPHTHALMIC
  Filled 2020-12-05: qty 2.5

## 2020-12-05 MED ORDER — TRANEXAMIC ACID-NACL 1000-0.7 MG/100ML-% IV SOLN
1000.0000 mg | INTRAVENOUS | Status: AC
  Administered 2020-12-05: 1000 mg via INTRAVENOUS
  Filled 2020-12-05: qty 100

## 2020-12-05 MED ORDER — PROPOFOL 10 MG/ML IV BOLUS
INTRAVENOUS | Status: DC | PRN
  Administered 2020-12-05: 20 mg via INTRAVENOUS

## 2020-12-05 MED ORDER — PHENOL 1.4 % MT LIQD: 1.0000 | OROMUCOSAL | Status: DC | PRN

## 2020-12-05 MED ORDER — ONDANSETRON HCL 4 MG PO TABS: 4.0000 mg | ORAL_TABLET | Freq: Four times a day (QID) | ORAL | Status: DC | PRN

## 2020-12-05 MED ORDER — ACETAMINOPHEN 325 MG PO TABS: 325.0000 mg | ORAL_TABLET | Freq: Four times a day (QID) | ORAL | Status: DC | PRN

## 2020-12-05 MED ORDER — PHENYLEPHRINE HCL (PRESSORS) 10 MG/ML IV SOLN
INTRAVENOUS | Status: AC
  Filled 2020-12-05: qty 1

## 2020-12-05 MED ORDER — BISACODYL 10 MG RE SUPP: 10.0000 mg | Freq: Every day | RECTAL | Status: DC | PRN

## 2020-12-05 MED ORDER — IRBESARTAN 150 MG PO TABS
150.0000 mg | ORAL_TABLET | Freq: Every day | ORAL | Status: DC
  Administered 2020-12-06: 150 mg via ORAL
  Filled 2020-12-05: qty 1

## 2020-12-05 MED ORDER — EPHEDRINE SULFATE-NACL 50-0.9 MG/10ML-% IV SOSY
PREFILLED_SYRINGE | INTRAVENOUS | Status: DC | PRN
  Administered 2020-12-05: 10 mg via INTRAVENOUS

## 2020-12-05 MED ORDER — SODIUM CHLORIDE 0.9 % IV SOLN: INTRAVENOUS | Status: DC

## 2020-12-05 MED ORDER — MENTHOL 3 MG MT LOZG: 1.0000 | LOZENGE | OROMUCOSAL | Status: DC | PRN

## 2020-12-05 MED ORDER — CEFAZOLIN SODIUM-DEXTROSE 2-4 GM/100ML-% IV SOLN
2.0000 g | Freq: Four times a day (QID) | INTRAVENOUS | Status: AC
  Administered 2020-12-05 (×2): 2 g via INTRAVENOUS
  Filled 2020-12-05 (×2): qty 100

## 2020-12-05 MED ORDER — FENTANYL CITRATE (PF) 100 MCG/2ML IJ SOLN
INTRAMUSCULAR | Status: AC
  Filled 2020-12-05: qty 2

## 2020-12-05 MED ORDER — METHOCARBAMOL 500 MG IVPB - SIMPLE MED
500.0000 mg | Freq: Four times a day (QID) | INTRAVENOUS | Status: DC | PRN
  Filled 2020-12-05: qty 50

## 2020-12-05 MED ORDER — BUPIVACAINE HCL 0.25 % IJ SOLN
INTRAMUSCULAR | Status: AC
  Filled 2020-12-05: qty 1

## 2020-12-05 MED ORDER — ONDANSETRON HCL 4 MG/2ML IJ SOLN
INTRAMUSCULAR | Status: AC
  Filled 2020-12-05: qty 2

## 2020-12-05 MED ORDER — WATER FOR IRRIGATION, STERILE IR SOLN
Status: DC | PRN
  Administered 2020-12-05: 2000 mL

## 2020-12-05 MED ORDER — ORAL CARE MOUTH RINSE
15.0000 mL | Freq: Once | OROMUCOSAL | Status: AC
  Administered 2020-12-05: 15 mL via OROMUCOSAL

## 2020-12-05 MED ORDER — MIDAZOLAM HCL 2 MG/2ML IJ SOLN
INTRAMUSCULAR | Status: AC
  Filled 2020-12-05: qty 2

## 2020-12-05 MED ORDER — DEXAMETHASONE SODIUM PHOSPHATE 10 MG/ML IJ SOLN: 8.0000 mg | Freq: Once | INTRAMUSCULAR | Status: DC

## 2020-12-05 MED ORDER — CHLORHEXIDINE GLUCONATE 0.12 % MT SOLN: 15.0000 mL | Freq: Once | OROMUCOSAL | Status: AC

## 2020-12-05 MED ORDER — EPHEDRINE 5 MG/ML INJ
INTRAVENOUS | Status: AC
  Filled 2020-12-05: qty 10

## 2020-12-05 MED ORDER — DEXAMETHASONE SODIUM PHOSPHATE 10 MG/ML IJ SOLN
10.0000 mg | Freq: Once | INTRAMUSCULAR | Status: AC
  Administered 2020-12-06: 10 mg via INTRAVENOUS
  Filled 2020-12-05: qty 1

## 2020-12-05 MED ORDER — DEXAMETHASONE SODIUM PHOSPHATE 10 MG/ML IJ SOLN
INTRAMUSCULAR | Status: AC
  Filled 2020-12-05: qty 1

## 2020-12-05 MED ORDER — POVIDONE-IODINE 10 % EX SWAB
2.0000 "application " | Freq: Once | CUTANEOUS | Status: AC
  Administered 2020-12-05: 2 via TOPICAL

## 2020-12-05 MED ORDER — DOCUSATE SODIUM 100 MG PO CAPS
100.0000 mg | ORAL_CAPSULE | Freq: Two times a day (BID) | ORAL | Status: DC
  Administered 2020-12-05 – 2020-12-06 (×2): 100 mg via ORAL
  Filled 2020-12-05 (×2): qty 1

## 2020-12-05 MED ORDER — BUPIVACAINE HCL 0.25 % IJ SOLN
INTRAMUSCULAR | Status: DC | PRN
  Administered 2020-12-05: 30 mL

## 2020-12-05 MED ORDER — POLYVINYL ALCOHOL 1.4 % OP SOLN: 1.0000 [drp] | OPHTHALMIC | Status: DC | PRN

## 2020-12-05 MED ORDER — ONDANSETRON HCL 4 MG/2ML IJ SOLN: 4.0000 mg | Freq: Four times a day (QID) | INTRAMUSCULAR | Status: DC | PRN

## 2020-12-05 SURGICAL SUPPLY — 46 items
BAG DECANTER FOR FLEXI CONT (MISCELLANEOUS) IMPLANT
BAG SPEC THK2 15X12 ZIP CLS (MISCELLANEOUS)
BAG ZIPLOCK 12X15 (MISCELLANEOUS) IMPLANT
BLADE SAG 18X100X1.27 (BLADE) ×2 IMPLANT
CLSR STERI-STRIP ANTIMIC 1/2X4 (GAUZE/BANDAGES/DRESSINGS) ×1 IMPLANT
COVER PERINEAL POST (MISCELLANEOUS) ×2 IMPLANT
COVER SURGICAL LIGHT HANDLE (MISCELLANEOUS) ×2 IMPLANT
COVER WAND RF STERILE (DRAPES) IMPLANT
CUP ACETBLR 48 OD SECTOR II (Hips) ×1 IMPLANT
DECANTER SPIKE VIAL GLASS SM (MISCELLANEOUS) ×2 IMPLANT
DRAPE STERI IOBAN 125X83 (DRAPES) ×2 IMPLANT
DRAPE U-SHAPE 47X51 STRL (DRAPES) ×4 IMPLANT
DRESSING AQUACEL AG SP 3.5X10 (GAUZE/BANDAGES/DRESSINGS) IMPLANT
DRSG AQUACEL AG ADV 3.5X10 (GAUZE/BANDAGES/DRESSINGS) ×2 IMPLANT
DRSG AQUACEL AG SP 3.5X10 (GAUZE/BANDAGES/DRESSINGS) ×2
DURAPREP 26ML APPLICATOR (WOUND CARE) ×2 IMPLANT
ELECT REM PT RETURN 15FT ADLT (MISCELLANEOUS) ×2 IMPLANT
EVACUATOR 1/8 PVC DRAIN (DRAIN) IMPLANT
GLOVE SRG 8 PF TXTR STRL LF DI (GLOVE) ×1 IMPLANT
GLOVE SURG ENC MOIS LTX SZ6 (GLOVE) IMPLANT
GLOVE SURG ENC MOIS LTX SZ7 (GLOVE) ×2 IMPLANT
GLOVE SURG ENC MOIS LTX SZ8 (GLOVE) ×2 IMPLANT
GLOVE SURG ENC TEXT LTX SZ7 (GLOVE) IMPLANT
GLOVE SURG UNDER POLY LF SZ6.5 (GLOVE) IMPLANT
GLOVE SURG UNDER POLY LF SZ8 (GLOVE) ×2
GLOVE SURG UNDER POLY LF SZ8.5 (GLOVE) IMPLANT
GOWN STRL REUS W/TWL LRG LVL3 (GOWN DISPOSABLE) ×2 IMPLANT
GOWN STRL REUS W/TWL XL LVL3 (GOWN DISPOSABLE) IMPLANT
HEAD FEM STD 28X+1.5 STRL (Hips) ×1 IMPLANT
HOLDER FOLEY CATH W/STRAP (MISCELLANEOUS) ×2 IMPLANT
KIT TURNOVER KIT A (KITS) ×2 IMPLANT
LINER MARATHON 28 48 (Hips) ×1 IMPLANT
MANIFOLD NEPTUNE II (INSTRUMENTS) ×2 IMPLANT
PACK ANTERIOR HIP CUSTOM (KITS) ×2 IMPLANT
PENCIL SMOKE EVACUATOR COATED (MISCELLANEOUS) ×2 IMPLANT
STEM FEMORAL SZ 6MM STD ACTIS (Stem) ×1 IMPLANT
STRIP CLOSURE SKIN 1/2X4 (GAUZE/BANDAGES/DRESSINGS) ×2 IMPLANT
SUT ETHIBOND NAB CT1 #1 30IN (SUTURE) ×2 IMPLANT
SUT MNCRL AB 4-0 PS2 18 (SUTURE) ×2 IMPLANT
SUT STRATAFIX 0 PDS 27 VIOLET (SUTURE) ×2
SUT VIC AB 2-0 CT1 27 (SUTURE) ×4
SUT VIC AB 2-0 CT1 TAPERPNT 27 (SUTURE) ×2 IMPLANT
SUTURE STRATFX 0 PDS 27 VIOLET (SUTURE) ×1 IMPLANT
SYR 50ML LL SCALE MARK (SYRINGE) IMPLANT
TRAY FOLEY MTR SLVR 16FR STAT (SET/KITS/TRAYS/PACK) ×2 IMPLANT
TUBE SUCTION HIGH CAP CLEAR NV (SUCTIONS) ×1 IMPLANT

## 2020-12-05 NOTE — Anesthesia Procedure Notes (Signed)
Spinal  Patient location during procedure: OR Start time: 12/05/2020 10:47 AM End time: 12/05/2020 10:57 AM Staffing Performed: anesthesiologist  Anesthesiologist: Duane Boston, MD Preanesthetic Checklist Completed: patient identified, IV checked, risks and benefits discussed, surgical consent, monitors and equipment checked, pre-op evaluation and timeout performed Spinal Block Patient position: sitting Prep: DuraPrep Patient monitoring: cardiac monitor, continuous pulse ox and blood pressure Approach: midline Location: L2-3 Injection technique: single-shot Needle Needle type: Pencan  Needle gauge: 24 G Needle length: 9 cm Additional Notes Functioning IV was confirmed and monitors were applied. Sterile prep and drape, including hand hygiene and sterile gloves were used. The patient was positioned and the spine was prepped. The skin was anesthetized with lidocaine.  Free flow of clear CSF was obtained prior to injecting local anesthetic into the CSF.  The spinal needle aspirated freely following injection.  The needle was carefully withdrawn.  The patient tolerated the procedure well.

## 2020-12-05 NOTE — Anesthesia Postprocedure Evaluation (Signed)
Anesthesia Post Note  Patient: Sheila Blair  Procedure(s) Performed: TOTAL HIP ARTHROPLASTY ANTERIOR APPROACH (Right Hip)     Patient location during evaluation: PACU Anesthesia Type: Spinal Level of consciousness: awake and alert Pain management: pain level controlled Vital Signs Assessment: post-procedure vital signs reviewed and stable Respiratory status: spontaneous breathing and respiratory function stable Cardiovascular status: blood pressure returned to baseline and stable Postop Assessment: spinal receding Anesthetic complications: no   No complications documented.  Last Vitals:  Vitals:   12/05/20 1329 12/05/20 1345  BP: 96/82 119/69  Pulse: (!) 54 82  Resp: 15 18  Temp:  36.4 C  SpO2: 99% 98%    Last Pain:  Vitals:   12/05/20 1345  TempSrc: Oral  PainSc: 0-No pain                 Bari Handshoe DANIEL

## 2020-12-05 NOTE — Op Note (Signed)
OPERATIVE REPORT- TOTAL HIP ARTHROPLASTY   PREOPERATIVE DIAGNOSIS: Osteoarthritis of the Right hip.   POSTOPERATIVE DIAGNOSIS: Osteoarthritis of the Right  hip.   PROCEDURE: Right total hip arthroplasty, anterior approach.   SURGEON: Gaynelle Arabian, MD   ASSISTANT: Griffith Citron, PA-C  ANESTHESIA:  Spinal  ESTIMATED BLOOD LOSS:-625 mL    DRAINS: Hemovac x1.   COMPLICATIONS: None   CONDITION: PACU - hemodynamically stable.   BRIEF CLINICAL NOTE: Sheila Blair is a 85 y.o. female who has advanced end-  stage arthritis of their Right  hip with progressively worsening pain and  dysfunction.The patient has failed nonoperative management and presents for  total hip arthroplasty.   PROCEDURE IN DETAIL: After successful administration of spinal  anesthetic, the traction boots for the [facility] bed were placed on both  feet and the patient was placed onto the [facility] bed, boots placed into the leg  holders. The Right hip was then isolated from the perineum with plastic  drapes and prepped and draped in the usual sterile fashion. ASIS and  greater trochanter were marked and a oblique incision was made, starting  at about 1 cm lateral and 2 cm distal to the ASIS and coursing towards  the anterior cortex of the femur. The skin was cut with a 10 blade  through subcutaneous tissue to the level of the fascia overlying the  tensor fascia lata muscle. The fascia was then incised in line with the  incision at the junction of the anterior third and posterior 2/3rd. The  muscle was teased off the fascia and then the interval between the TFL  and the rectus was developed. The Hohmann retractor was then placed at  the top of the femoral neck over the capsule. The vessels overlying the  capsule were cauterized and the fat on top of the capsule was removed.  A Hohmann retractor was then placed anterior underneath the rectus  femoris to give exposure to the entire anterior capsule. A T-shaped   capsulotomy was performed. The edges were tagged and the femoral head  was identified.       Osteophytes are removed off the superior acetabulum.  The femoral neck was then cut in situ with an oscillating saw. Traction  was then applied to the left lower extremity utilizing the [facility]  traction. The femoral head was then removed. Retractors were placed  around the acetabulum and then circumferential removal of the labrum was  performed. Osteophytes were also removed. Reaming starts at 45 mm to  medialize and  Increased in 2 mm increments to 47 mm. We reamed in  approximately 40 degrees of abduction, 20 degrees anteversion. A 48 mm  pinnacle acetabular shell was then impacted in anatomic position under  fluoroscopic guidance with excellent purchase. We did not need to place  any additional dome screws. A 28 mm neutral + 4 marathon liner was then  placed into the acetabular shell.       The femoral lift was then placed along the lateral aspect of the femur  just distal to the vastus ridge. The leg was  externally rotated and capsule  was stripped off the inferior aspect of the femoral neck down to the  level of the lesser trochanter, this was done with electrocautery. The femur was lifted after this was performed. The  leg was then placed in an extended and adducted position essentially delivering the femur. We also removed the capsule superiorly and the piriformis from the piriformis  fossa to gain excellent exposure of the  proximal femur. Rongeur was used to remove some cancellous bone to get  into the lateral portion of the proximal femur for placement of the  initial starter reamer. The starter broaches was placed  the starter broach  and was shown to go down the center of the canal. Broaching  with the Actis system was then performed starting at size 0  coursing  Up to size 5. A size 5 had excellent torsional and rotational  and axial stability. The trial sta offset neck was then placed   with a 28 + 1.5 trial head. The hip was then reduced. We confirmed that  the stem was in the canal both on AP and lateral x-rays. It also has excellent sizing. The hip was reduced with outstanding stability through full extension and full external rotation.. AP pelvis was taken and the leg lengths were measured and found to be equal. Hip was then dislocated again and the femoral head and neck removed. The  femoral broach was removed. Size 5 Actis stem with a standard offset  neck was then impacted into the femur following native anteversion. Has  excellent purchase in the canal. Excellent torsional and rotational and  axial stability. It is confirmed to be in the canal on AP and lateral  fluoroscopic views. The 28 + 1.5 metal head was placed and the hip  reduced with outstanding stability. Again AP pelvis was taken and it  confirmed that the leg lengths were equal. The wound was then copiously  irrigated with saline solution and the capsule reattached and repaired  with Ethibond suture. 30 ml of .25% Bupivicaine was  injected into the capsule and into the edge of the tensor fascia lata as well as subcutaneous tissue. The fascia overlying the tensor fascia lata was then closed with a running #1 V-Loc. Subcu was closed with interrupted 2-0 Vicryl and subcuticular running 4-0 Monocryl. Incision was cleaned  and dried. Steri-Strips and a bulky sterile dressing applied. Hemovac  drain was hooked to suction and then the patient was awakened and transported to  recovery in stable condition.        Please note that a surgical assistant was a medical necessity for this procedure to perform it in a safe and expeditious manner. Assistant was necessary to provide appropriate retraction of vital neurovascular structures and to prevent femoral fracture and allow for anatomic placement of the prosthesis.  Gaynelle Arabian, M.D.

## 2020-12-05 NOTE — Anesthesia Preprocedure Evaluation (Addendum)
Anesthesia Evaluation  Patient identified by MRN, date of birth, ID band Patient awake    Reviewed: Allergy & Precautions, NPO status , Patient's Chart, lab work & pertinent test results  History of Anesthesia Complications (+) DIFFICULT AIRWAY and history of anesthetic complications  Airway Mallampati: III  TM Distance: >3 FB Neck ROM: Full  Mouth opening: Limited Mouth Opening  Dental no notable dental hx. (+) Dental Advisory Given   Pulmonary sleep apnea ,    Pulmonary exam normal        Cardiovascular hypertension, Pt. on home beta blockers and Pt. on medications Normal cardiovascular exam     Neuro/Psych negative neurological ROS     GI/Hepatic negative GI ROS, Neg liver ROS,   Endo/Other  negative endocrine ROSdiabetes  Renal/GU negative Renal ROS     Musculoskeletal negative musculoskeletal ROS (+)   Abdominal   Peds  Hematology negative hematology ROS (+)   Anesthesia Other Findings   Reproductive/Obstetrics                           Anesthesia Physical Anesthesia Plan  ASA: III  Anesthesia Plan: Spinal   Post-op Pain Management:    Induction:   PONV Risk Score and Plan: 2 and Ondansetron and Propofol infusion  Airway Management Planned: Natural Airway  Additional Equipment:   Intra-op Plan:   Post-operative Plan:   Informed Consent: I have reviewed the patients History and Physical, chart, labs and discussed the procedure including the risks, benefits and alternatives for the proposed anesthesia with the patient or authorized representative who has indicated his/her understanding and acceptance.     Dental advisory given  Plan Discussed with: Anesthesiologist and CRNA  Anesthesia Plan Comments: (Discussed INR of 1.4.  Pt has history of difficult airway with non reassuring exam.  Stopped Plavix last week.  Will attempt spinal.)      Anesthesia Quick  Evaluation

## 2020-12-05 NOTE — Anesthesia Procedure Notes (Signed)
Procedure Name: MAC Date/Time: 12/05/2020 10:45 AM Performed by: Lissa Morales, CRNA Pre-anesthesia Checklist: Patient identified, Emergency Drugs available, Suction available, Patient being monitored and Timeout performed Patient Re-evaluated:Patient Re-evaluated prior to induction Oxygen Delivery Method: Simple face mask Placement Confirmation: positive ETCO2

## 2020-12-05 NOTE — Evaluation (Signed)
Physical Therapy Evaluation Patient Details Name: Sheila Blair MRN: [id number] DOB: 05-19-1936 Today's Date: 12/05/2020   History of Present Illness  Patient is 85 y.o. female s/p Rt THA anterior approach on 12/05/20 with PMH significant for HTN, A-fib, DM, breast cancer.    Clinical Impression  Sheila Blair is a 85 y.o. female POD 0 s/p Rt THA. Patient reports independence with mobility at baseline. Patient is now limited by functional impairments (see PT problem list below) and requires min assist for transfers and gait with RW. Patient was able to ambulate ~60 feet with RW and min assist. Patient instructed in exercise to facilitate circulation to reduce risk of DVT.  Patient will benefit from continued skilled PT interventions to address impairments and progress towards PLOF. Acute PT will follow to progress mobility and stair training in preparation for safe discharge home.     Follow Up Recommendations Follow surgeon's recommendation for DC plan and follow-up therapies;Outpatient PT (pt has expressed interest in OPPT)    Equipment Recommendations  Rolling walker with 5" wheels;3in1 (PT)    Recommendations for Other Services       Precautions / Restrictions Precautions Precautions: Fall Restrictions Weight Bearing Restrictions: No Other Position/Activity Restrictions: WBAT      Mobility  Bed Mobility Overal bed mobility: Needs Assistance Bed Mobility: Supine to Sit     Supine to sit: Min assist;HOB elevated     General bed mobility comments: cues to use bed rail and assist for Rt LE to EOB.    Transfers Overall transfer level: Needs assistance Equipment used: Rolling walker (2 wheeled) Transfers: Sit to/from Stand Sit to Stand: Min assist;From elevated surface         General transfer comment: cues for hand placement on RW, assist for power up from EOB. pt steady once standing.  Ambulation/Gait Ambulation/Gait assistance: Min assist Gait Distance (Feet): 60  Feet Assistive device: Rolling walker (2 wheeled) Gait Pattern/deviations: Step-to pattern;Decreased stride length;Decreased weight shift to right Gait velocity: decr   General Gait Details: cues for step pattern and proximity to RW, assist to maintain safe position to RW.  Stairs            Wheelchair Mobility    Modified Rankin (Stroke Patients Only)       Balance Overall balance assessment: Needs assistance Sitting-balance support: Feet supported Sitting balance-Leahy Scale: Good     Standing balance support: During functional activity;Bilateral upper extremity supported Standing balance-Leahy Scale: Poor                               Pertinent Vitals/Pain Pain Assessment: Faces Faces Pain Scale: Hurts a little bit Pain Location: Rt hip Pain Descriptors / Indicators: Burning;Discomfort Pain Intervention(s): Limited activity within patient's tolerance;Monitored during session;Repositioned;Ice applied    Home Living Family/patient expects to be discharged to:: Private residence Living Arrangements: Spouse/significant other Available Help at Discharge: Family Type of Home: House Home Access: Stairs to enter Entrance Stairs-Rails: Psychiatric nurse of Steps: 3 Home Layout: One level Sheila: [place] - single point;Grab bars - tub/shower Additional Comments: daughters are available intermittenly, can help during first week more. pt's husband is at home but limited in physical assist he can provide.    Prior Function Level of Independence: Independent with assistive device(s)         Comments: pt using SPC for mobility.     Hand Dominance   Dominant Hand: Right  Extremity/Trunk Assessment   Upper Extremity Assessment Upper Extremity Assessment: Overall WFL for tasks assessed    Lower Extremity Assessment Lower Extremity Assessment: Overall WFL for tasks assessed;RLE deficits/detail RLE Deficits / Details: good quad  set, no SLR due to hip pain RLE: Unable to fully assess due to pain RLE Sensation: WNL RLE Coordination: WNL    Cervical / Trunk Assessment Cervical / Trunk Assessment: Normal  Communication   Communication: No difficulties  Cognition Arousal/Alertness: Awake/alert Behavior During Therapy: WFL for tasks assessed/performed Overall Cognitive Status: Within Functional Limits for tasks assessed                                        General Comments      Exercises Total Joint Exercises Ankle Circles/Pumps: AROM;Both;20 reps;Seated Quad Sets: AROM;5 reps;Right;Seated   Assessment/Plan    PT Assessment Patient needs continued PT services  PT Problem List Decreased strength;Decreased range of motion;Decreased activity tolerance;Decreased balance;Decreased mobility;Decreased knowledge of use of DME;Decreased knowledge of precautions       PT Treatment Interventions DME instruction;Gait training;Stair training;Functional mobility training;Therapeutic activities;Therapeutic exercise;Balance training;Patient/family education;Neuromuscular re-education    PT Goals (Current goals can be found in the Care Plan section)  Acute Rehab PT Goals Patient Stated Goal: regain independence and get back to painting PT Goal Formulation: With patient/family Time For Goal Achievement: 12/12/20 Potential to Achieve Goals: Good    Frequency 7X/week   Barriers to discharge        Co-evaluation               AM-PAC PT "6 Clicks" Mobility  Outcome Measure Help needed turning from your back to your side while in a flat bed without using bedrails?: A Little Help needed moving from lying on your back to sitting on the side of a flat bed without using bedrails?: A Little Help needed moving to and from a bed to a chair (including a wheelchair)?: A Little Help needed standing up from a chair using your arms (e.g., wheelchair or bedside chair)?: A Little Help needed to walk in  hospital room?: A Little Help needed climbing 3-5 steps with a railing? : A Little 6 Click Score: 18    End of Session Equipment Utilized During Treatment: Gait belt Activity Tolerance: Patient tolerated treatment well Patient left: in chair;with call bell/phone within reach;with chair alarm set;with family/visitor present Nurse Communication: Mobility status PT Visit Diagnosis: Muscle weakness (generalized) (M62.81);Difficulty in walking, not elsewhere classified (R26.2)    Time: 1287-8676 PT Time Calculation (min) (ACUTE ONLY): 25 min   Charges:   PT Evaluation $PT Eval Low Complexity: 1 Low PT Treatments $Gait Training: 8-22 mins        Verner Mould, DPT Acute Rehabilitation Services Office [phone] Pager [phone]    Jacques Navy 12/05/2020, 6:05 PM

## 2020-12-05 NOTE — Transfer of Care (Signed)
Immediate Anesthesia Transfer of Care Note  Patient: Sheila Blair  Procedure(s) Performed: TOTAL HIP ARTHROPLASTY ANTERIOR APPROACH (Right Hip)  Patient Location: PACU  Anesthesia Type:Spinal  Level of Consciousness: awake, alert , oriented and patient cooperative  Airway & Oxygen Therapy: Patient Spontanous Breathing and Patient connected to face mask oxygen  Post-op Assessment: Report given to RN and Post -op Vital signs reviewed and stable  Post vital signs: stable  Last Vitals:  Vitals Value Taken Time  BP 96/82 12/05/20 1330  Temp 36.5 C 12/05/20 1230  Pulse 55 12/05/20 1333  Resp 18 12/05/20 1333  SpO2 93 % 12/05/20 1333  Vitals shown include unvalidated device data.  Last Pain:  Vitals:   12/05/20 1329  TempSrc:   PainSc: 0-No pain         Complications: No complications documented.

## 2020-12-05 NOTE — Care Plan (Signed)
Ortho Bundle Case Management Note  Patient Details  Name: Sheila Blair MRN: [id number] Date of Birth: [birth year]                  R THA on 12/05/20. DCP: Home with daughter. 2 story home with 2 steps. DME: RW & 3in1 ordered through [facility]. PT: HE[facility]   DME Arranged:  3-N-1,Walker rolling DME Agency:  [employer]  HH Arranged:    [place] Agency:     Additional Comments: Please contact me with any questions of if this plan should need to change.  Marianne Sofia, RN,CCM [facility]  [phone] 12/05/2020, 11:42 AM

## 2020-12-05 NOTE — Interval H&P Note (Signed)
History and Physical Interval Note:  12/05/2020 9:43 AM  Sheila Blair  has presented today for surgery, with the diagnosis of right hip osteoarthritis.  The various methods of treatment have been discussed with the patient and family. After consideration of risks, benefits and other options for treatment, the patient has consented to  Procedure(s) with comments: [place] (Right) - 124min as a surgical intervention.  The patient's history has been reviewed, patient examined, no change in status, stable for surgery.  I have reviewed the patient's chart and labs.  Questions were answered to the patient's satisfaction.     Pilar Plate Emelyn Roen

## 2020-12-05 NOTE — Discharge Instructions (Signed)
Sheila Arabian, MD Total Joint Specialist [facility] [address] [place], [place] [address] [phone]  ANTERIOR APPROACH TOTAL HIP REPLACEMENT POSTOPERATIVE DIRECTIONS     Hip Rehabilitation, Guidelines Following Surgery  The results of a hip operation are greatly improved after range of motion and muscle strengthening exercises. Follow all safety measures which are given to protect your hip. If any of these exercises cause increased pain or swelling in your joint, decrease the amount until you are comfortable again. Then slowly increase the exercises. Call your caregiver if you have problems or questions.   BLOOD CLOT PREVENTION  Upon discharge, continue taking Pradaxa 150mg  twice daily as previously prescribed.    HOME CARE INSTRUCTIONS   Remove items at home which could result in a fall. This includes throw rugs or furniture in walking pathways.   ICE to the affected hip as frequently as 20-30 minutes an hour and then as needed for pain and swelling. Continue to use ice on the hip for pain and swelling from surgery. You may notice swelling that will progress down to the foot and ankle. This is normal after surgery. Elevate the leg when you are not up walking on it.    Continue to use the breathing machine which will help keep your temperature down.  It is common for your temperature to cycle up and down following surgery, especially at night when you are not up moving around and exerting yourself.  The breathing machine keeps your lungs expanded and your temperature down.  DIET You may resume your previous home diet once your are discharged from the hospital.  DRESSING / WOUND CARE / SHOWERING  You have an adhesive waterproof bandage over the incision. Leave this in place until your first follow-up appointment. Once you remove this you will not need to place another bandage.   You may begin showering 3 days following surgery, but do not submerge  the incision under water.  ACTIVITY  For the first 3-5 days, it is important to rest and keep the operative leg elevated. You should, as a general rule, rest for 50 minutes and walk/stretch for 10 minutes per hour. After 5 days, you may slowly increase activity as tolerated.   Perform the exercises you were provided twice a day for about 15-20 minutes each session. Begin these 2 days following surgery.  Walk with your walker as instructed. Use the walker until you are comfortable transitioning to a cane. Walk with the cane in the opposite hand of the operative leg. You may discontinue the cane once you are comfortable and walking steadily.  Avoid periods of inactivity such as sitting longer than an hour when not asleep. This helps prevent blood clots.   Do not drive a car for 6 weeks or until released by your surgeon.   Do not drive while taking narcotics.  TED HOSE STOCKINGS Wear the elastic stockings on both legs for three weeks following surgery during the day. You may remove them at night while sleeping.  WEIGHT BEARING Weight bearing as tolerated with assist device (walker, cane, etc) as directed, use it as long as suggested by your surgeon or therapist, typically at least 4-6 weeks.  POSTOPERATIVE CONSTIPATION PROTOCOL Constipation - defined medically as fewer than three stools per week and severe constipation as less than one stool per week.  One of the most common issues patients have following surgery is constipation.  Even if you have a regular bowel pattern at home, your normal  regimen is likely to be disrupted due to multiple reasons following surgery.  Combination of anesthesia, postoperative narcotics, change in appetite and fluid intake all can affect your bowels.  In order to avoid complications following surgery, here are some recommendations in order to help you during your recovery period.   Colace (docusate) - Pick up an over-the-counter form of Colace or another stool  softener and take twice a day as long as you are requiring postoperative pain medications.  Take with a full glass of water daily.  If you experience loose stools or diarrhea, hold the colace until you stool forms back up.  If your symptoms do not get better within 1 week or if they get worse, check with your doctor.  Dulcolax (bisacodyl) - Pick up over-the-counter and take as directed by the product packaging as needed to assist with the movement of your bowels.  Take with a full glass of water.  Use this product as needed if not relieved by Colace only.   MiraLax (polyethylene glycol) - Pick up over-the-counter to have on hand.  MiraLax is a solution that will increase the amount of water in your bowels to assist with bowel movements.  Take as directed and can mix with a glass of water, juice, soda, coffee, or tea.  Take if you go more than two days without a movement.Do not use MiraLax more than once per day. Call your doctor if you are still constipated or irregular after using this medication for 7 days in a row.  If you continue to have problems with postoperative constipation, please contact the office for further assistance and recommendations.  If you experience "the worst abdominal pain ever" or develop nausea or vomiting, please contact the office immediatly for further recommendations for treatment.  ITCHING  If you experience itching with your medications, try taking only a single pain pill, or even half a pain pill at a time.  You can also use Benadryl over the counter for itching or also to help with sleep.   MEDICATIONS See your medication summary on the After Visit Summary that the nursing staff will review with you prior to discharge.  You may have some home medications which will be placed on hold until you complete the course of blood thinner medication.  It is important for you to complete the blood thinner medication as prescribed by your surgeon.  Continue your approved  medications as instructed at time of discharge.  PRECAUTIONS If you experience chest pain or shortness of breath - call 911 immediately for transfer to the hospital emergency department.  If you develop a fever greater that 101 F, purulent drainage from wound, increased redness or drainage from wound, foul odor from the wound/dressing, or calf pain - CONTACT YOUR SURGEON.                                                   FOLLOW-UP APPOINTMENTS Make sure you keep all of your appointments after your operation with your surgeon and caregivers. You should call the office at the above phone number and make an appointment for approximately two weeks after the date of your surgery or on the date instructed by your surgeon outlined in the "After Visit Summary".  RANGE OF MOTION AND STRENGTHENING EXERCISES  These exercises are designed to help you keep full movement  of your hip joint. Follow your caregiver's or physical therapist's instructions. Perform all exercises about fifteen times, three times per day or as directed. Exercise both hips, even if you have had only one joint replacement. These exercises can be done on a training (exercise) mat, on the floor, on a table or on a bed. Use whatever works the best and is most comfortable for you. Use music or television while you are exercising so that the exercises are a pleasant break in your day. This will make your life better with the exercises acting as a break in routine you can look forward to.   Lying on your back, slowly slide your foot toward your buttocks, raising your knee up off the floor. Then slowly slide your foot back down until your leg is straight again.   Lying on your back spread your legs as far apart as you can without causing discomfort.   Lying on your side, raise your upper leg and foot straight up from the floor as far as is comfortable. Slowly lower the leg and repeat.   Lying on your back, tighten up the muscle in the front of  your thigh (quadriceps muscles). You can do this by keeping your leg straight and trying to raise your heel off the floor. This helps strengthen the largest muscle supporting your knee.   Lying on your back, tighten up the muscles of your buttocks both with the legs straight and with the knee bent at a comfortable angle while keeping your heel on the floor.   IF YOU ARE TRANSFERRED TO A SKILLED REHAB FACILITY If the patient is transferred to a skilled rehab facility following release from the hospital, a list of the current medications will be sent to the facility for the patient to continue.  When discharged from the skilled rehab facility, please have the facility set up the patient's [place] prior to being released. Also, the skilled facility will be responsible for providing the patient with their medications at time of release from the facility to include their pain medication, the muscle relaxants, and their blood thinner medication. If the patient is still at the rehab facility at time of the two week follow up appointment, the skilled rehab facility will also need to assist the patient in arranging follow up appointment in our office and any transportation needs.  MAKE SURE YOU:   Understand these instructions.   Get help right away if you are not doing well or get worse.    DENTAL ANTIBIOTICS:  In most cases prophylactic antibiotics for Dental procdeures after total joint surgery are not necessary.  Exceptions are as follows:  1. History of prior total joint infection  2. Severely immunocompromised (Organ Transplant, cancer chemotherapy, Rheumatoid biologic meds such as [place])  3. Poorly controlled diabetes (A1C &gt; 8.0, blood glucose over 200)  If you have one of these conditions, contact your surgeon for an antibiotic prescription, prior to your dental procedure.    Pick up stool softner and laxative for home use following surgery while on pain  medications. Do not submerge incision under water. Please use good hand washing techniques while changing dressing each day. May shower starting three days after surgery. Please use a clean towel to pat the incision dry following showers. Continue to use ice for pain and swelling after surgery. Do not use any lotions or creams on the incision until instructed by your surgeon.

## 2020-12-06 ENCOUNTER — Encounter (HOSPITAL_COMMUNITY): Payer: Self-pay | Admitting: Orthopedic Surgery

## 2020-12-06 DIAGNOSIS — M25551 Pain in right hip: Secondary | ICD-10-CM | POA: Diagnosis not present

## 2020-12-06 DIAGNOSIS — Z79899 Other long term (current) drug therapy: Secondary | ICD-10-CM | POA: Diagnosis not present

## 2020-12-06 DIAGNOSIS — M1611 Unilateral primary osteoarthritis, right hip: Secondary | ICD-10-CM | POA: Diagnosis not present

## 2020-12-06 DIAGNOSIS — I4891 Unspecified atrial fibrillation: Secondary | ICD-10-CM | POA: Diagnosis not present

## 2020-12-06 DIAGNOSIS — Z7952 Long term (current) use of systemic steroids: Secondary | ICD-10-CM | POA: Diagnosis not present

## 2020-12-06 DIAGNOSIS — E7800 Pure hypercholesterolemia, unspecified: Secondary | ICD-10-CM | POA: Diagnosis not present

## 2020-12-06 DIAGNOSIS — I10 Essential (primary) hypertension: Secondary | ICD-10-CM | POA: Diagnosis not present

## 2020-12-06 LAB — CBC
HCT: 36.7 % (ref 36.0–46.0)
Hemoglobin: 12 g/dL (ref 12.0–15.0)
MCH: 31.4 pg (ref 26.0–34.0)
MCHC: 32.7 g/dL (ref 30.0–36.0)
MCV: 96.1 fL (ref 80.0–100.0)
Platelets: 170 10*3/uL (ref 150–400)
RBC: 3.82 MIL/uL — ABNORMAL LOW (ref 3.87–5.11)
RDW: 13.8 % (ref 11.5–15.5)
WBC: 8.9 10*3/uL (ref 4.0–10.5)
nRBC: 0 % (ref 0.0–0.2)

## 2020-12-06 LAB — BASIC METABOLIC PANEL
Anion gap: 5 (ref 5–15)
BUN: 14 mg/dL (ref 8–23)
CO2: 31 mmol/L (ref 22–32)
Calcium: 8.7 mg/dL — ABNORMAL LOW (ref 8.9–10.3)
Chloride: 100 mmol/L (ref 98–111)
Creatinine, Ser: 0.7 mg/dL (ref 0.44–1.00)
GFR, Estimated: >60 mL/min (ref >60)
Glucose, Bld: 209 mg/dL — ABNORMAL HIGH (ref 70–99)
Potassium: 3.4 mmol/L — ABNORMAL LOW (ref 3.5–5.1)
Sodium: 136 mmol/L (ref 135–145)

## 2020-12-06 MED ORDER — METHOCARBAMOL 500 MG PO TABS: 500.0000 mg | ORAL_TABLET | Freq: Four times a day (QID) | ORAL | 0 refills | Status: DC | PRN

## 2020-12-06 MED ORDER — POTASSIUM CHLORIDE CRYS ER 20 MEQ PO TBCR
40.0000 meq | EXTENDED_RELEASE_TABLET | ORAL | Status: AC
  Administered 2020-12-06 (×2): 40 meq via ORAL
  Filled 2020-12-06 (×2): qty 2

## 2020-12-06 MED ORDER — HYDROCODONE-ACETAMINOPHEN 5-325 MG PO TABS: 1.0000 | ORAL_TABLET | Freq: Four times a day (QID) | ORAL | 0 refills | Status: DC | PRN

## 2020-12-06 NOTE — Progress Notes (Signed)
Pt verbalizes understanding of all discharge instructions all questions answered. To car with discharge instructions, equipment and all belongings

## 2020-12-06 NOTE — Progress Notes (Cosign Needed Addendum)
   Subjective: 1 Day Post-Op Procedure(s) (LRB): TOTAL HIP ARTHROPLASTY ANTERIOR APPROACH (Right) Patient reports pain as mild.   Patient seen in rounds by Dr. Wynelle Link. Patient is well, and has had no acute complaints or problems. Denies SOB, chest pain, or calf pain. Was  Able to ambulate 60 feet with PT yesterday. No acute overnight events.  We will continue therapy today.   Objective: Vital signs in last 24 hours: Temp:  [97.4 F (36.3 C)-98.6 F (37 C)] 98.6 F (37 C) (03/03 0752) Pulse Rate:  [38-82] 69 (03/03 0752) Resp:  [15-20] 15 (03/03 0752) BP: (96-140)/(59-88) 140/86 (03/03 0800) SpO2:  [93 %-100 %] 95 % (03/03 0752) Weight:  [91.1 kg-93 kg] 91.1 kg (03/02 1345)  Intake/Output from previous day:  Intake/Output Summary (Last 24 hours) at 12/06/2020 0810 Last data filed at 12/06/2020 0600 Gross per 24 hour  Intake 5621.13 ml  Output 2900 ml  Net 2721.13 ml     Intake/Output this shift: No intake/output data recorded.  Labs: Recent Labs    12/06/20 0307  HGB 12.0   Recent Labs    12/06/20 0307  WBC 8.9  RBC 3.82*  HCT 36.7  PLT 170   Recent Labs    12/06/20 0307  NA 136  K 3.4*  CL 100  CO2 31  BUN 14  CREATININE 0.70  GLUCOSE 209*  CALCIUM 8.7*   No results for input(s): LABPT, INR in the last 72 hours.  Exam: General - Patient is Alert and Oriented Extremity - Neurologically intact Neurovascular intact Intact pulses distally Dorsiflexion/Plantar flexion intact Dressing - dressing C/D/I Motor Function - intact, moving foot and toes well on exam.   Past Medical History:  Diagnosis Date  . Arthritis   . Atrial fibrillation ([place])   . Atypical nevus 09/21/2013   Mild-Left tricep  . Breast cancer ([place])    BILATERAL   . Complication of anesthesia   . Diabetes mellitus without complication ([place])    TYPE 2   . Difficult intubation    PT REPORTS NARROW AIRWAY BUT  HAS HAD 17 SURGERIES WITHOUT A PROBLEM - SURGERIES AT [facility] AND AT [facility]  PER PT   . Dysrhythmia    AFIB   . Hypertension   . SCC (squamous cell carcinoma) 12/19/2019   right lower leg anterior-txpbx  . SCCA (squamous cell carcinoma) of skin 04/06/2017   RIGHT UPPER ARM-CX3,5FU  . Sleep apnea    CPAP     Assessment/Plan: 1 Day Post-Op Procedure(s) (LRB): TOTAL HIP ARTHROPLASTY ANTERIOR APPROACH (Right) Principal Problem:   OA (osteoarthritis) of hip Active Problems:   Osteoarthritis of right hip  Estimated body mass index is 31.45 kg/m as calculated from the following:   Height as of this encounter: 5\' 7"  (1.702 m).   Weight as of this encounter: 91.1 kg. Advance diet Up with therapy  DVT Prophylaxis - Pradaxa 150mg  BID Weight bearing as tolerated. Continue therapy.  Potassium was 3.4 this morning - ordered 2 doses of 62mEq potassium chloride SA.   Plan for physical therapy for one session this morning, and if meeting goals, will plan for discharge today.   Plan is to go Home after hospital stay.  Patient to follow up with Dr. Wynelle Link in clinic on 12/18/20.  The PDMP database was reviewed today prior to any opioid medications being prescribed to this patient.  Fenton Foy, MBA, PA-C Orthopedic Surgery 12/06/2020, 8:10 AM

## 2020-12-06 NOTE — TOC Transition Note (Signed)
Transition of Care [facility]) - CM/SW Discharge Note   Patient Details  Name: Sheila Blair MRN: [id number] Date of Birth: 1936-07-23  [facility] CM/SW Contact:  Lennart Pall, LCSW Phone Number: 12/06/2020, 10:35 AM   Clinical Narrative:     Met with pt and alerted that orders had been placed for HHPT. No agency preference.  Leonides Grills, RN with Dr. Peri Maris office has placed referral with [facility].  DME has been delivered to her room by [employer].  No further TOC needs.  Final next level of care: [place] Barriers to Discharge: No Barriers Identified   Patient Goals and CMS Choice Patient states their goals for this hospitalization and ongoing recovery are:: return home      Discharge Placement                       Discharge Plan and Services                DME Arranged: 3-N-1,Walker rolling DME Agency: [employer]       HH Arranged: PT [place] Agency: [facility] (formerly [employer]) Date [place]: 12/06/20   Representative spoke with at [place]: [place] ([place]) Interventions     Readmission Risk Interventions No flowsheet data found.

## 2020-12-06 NOTE — Progress Notes (Signed)
Physical Therapy Treatment Patient Details Name: Sheila Blair MRN: [id number] DOB: Dec 27, 1935 Today's Date: 12/06/2020    History of Present Illness Patient is 85 y.o. female s/p Rt THA anterior approach on 12/05/20 with PMH significant for HTN, A-fib, DM, breast cancer.    PT Comments    Pt is progressing well with mobility and is ready to DC home from PT standpoint. She ambulated 150' with RW, completed stair training, and demonstrates good understanding of HEP. Following this PT session, pt was observed walking in her room without RW and without assist, her chair alarm was activated. Reminded pt she needs to use RW when getting up and to call for assist.   Follow Up Recommendations  Follow surgeon's recommendation for DC plan and follow-up therapies (pt has expressed interest in OPPT)     Equipment Recommendations  Rolling walker with 5" wheels;3in1 (PT)    Recommendations for Other Services       Precautions / Restrictions Precautions Precautions: Fall Restrictions Weight Bearing Restrictions: No Other Position/Activity Restrictions: WBAT    Mobility  Bed Mobility Overal bed mobility: Modified Independent       Supine to sit: Modified independent (Device/Increase time);HOB elevated     General bed mobility comments: used rail    Transfers Overall transfer level: Needs assistance Equipment used: Rolling walker (2 wheeled) Transfers: Sit to/from Stand Sit to Stand: Supervision         General transfer comment: VCs hand placement  Ambulation/Gait Ambulation/Gait assistance: Min guard Gait Distance (Feet): 150 Feet Assistive device: Rolling walker (2 wheeled) Gait Pattern/deviations: Step-to pattern;Decreased stride length;Decreased weight shift to right Gait velocity: decr   General Gait Details: good sequencing, no loss of balance   Stairs Stairs: Yes Stairs assistance: Min guard Stair Management: One rail Right;Forwards;With cane Number of Stairs:  5 General stair comments: VCs sequencing, min/guard for safety   Wheelchair Mobility    Modified Rankin (Stroke Patients Only)       Balance Overall balance assessment: Needs assistance Sitting-balance support: Feet supported Sitting balance-Leahy Scale: Good     Standing balance support: During functional activity;Bilateral upper extremity supported Standing balance-Leahy Scale: Poor                              Cognition Arousal/Alertness: Awake/alert Behavior During Therapy: WFL for tasks assessed/performed Overall Cognitive Status: Within Functional Limits for tasks assessed                                        Exercises Total Joint Exercises Ankle Circles/Pumps: AROM;Both;20 reps;Seated Quad Sets: AROM;5 reps;Right;Supine Short Arc Quad: AROM;Right;10 reps Heel Slides: AAROM;Right;10 reps;Supine Hip ABduction/ADduction: AAROM;Right;10 reps;Supine Long Arc Quad: AROM;Right;10 reps;Seated    General Comments        Pertinent Vitals/Pain Pain Assessment: 0-10 Pain Score: 5  Pain Location: Rt hip Pain Descriptors / Indicators: Burning;Discomfort    Home Living                      Prior Function            PT Goals (current goals can now be found in the care plan section) Acute Rehab PT Goals Patient Stated Goal: regain independence and get back to painting PT Goal Formulation: With patient/family Time For Goal Achievement: 12/12/20 Potential to Achieve Goals: Good Progress towards  PT goals: Progressing toward goals    Frequency    7X/week      PT Plan Current plan remains appropriate    Co-evaluation              AM-PAC PT "6 Clicks" Mobility   Outcome Measure  Help needed turning from your back to your side while in a flat bed without using bedrails?: A Little Help needed moving from lying on your back to sitting on the side of a flat bed without using bedrails?: A Little Help needed moving  to and from a bed to a chair (including a wheelchair)?: None Help needed standing up from a chair using your arms (e.g., wheelchair or bedside chair)?: None Help needed to walk in hospital room?: None Help needed climbing 3-5 steps with a railing? : A Little 6 Click Score: 21    End of Session Equipment Utilized During Treatment: Gait belt Activity Tolerance: Patient tolerated treatment well Patient left: in chair;with call bell/phone within reach;with chair alarm set Nurse Communication: Mobility status PT Visit Diagnosis: Muscle weakness (generalized) (M62.81);Difficulty in walking, not elsewhere classified (R26.2)     Time: 5027-7412 PT Time Calculation (min) (ACUTE ONLY): 45 min  Charges:  $Gait Training: 8-22 mins $Therapeutic Exercise: 8-22 mins $Therapeutic Activity: 8-22 mins                     Blondell Reveal Kistler PT 12/06/2020  Acute Rehabilitation Services Pager [phone] Office [phone]

## 2020-12-10 DIAGNOSIS — C50912 Malignant neoplasm of unspecified site of left female breast: Secondary | ICD-10-CM | POA: Diagnosis not present

## 2020-12-10 DIAGNOSIS — Z471 Aftercare following joint replacement surgery: Secondary | ICD-10-CM | POA: Diagnosis not present

## 2020-12-10 DIAGNOSIS — I10 Essential (primary) hypertension: Secondary | ICD-10-CM | POA: Diagnosis not present

## 2020-12-10 DIAGNOSIS — E119 Type 2 diabetes mellitus without complications: Secondary | ICD-10-CM | POA: Diagnosis not present

## 2020-12-10 DIAGNOSIS — I4891 Unspecified atrial fibrillation: Secondary | ICD-10-CM | POA: Diagnosis not present

## 2020-12-10 DIAGNOSIS — C50911 Malignant neoplasm of unspecified site of right female breast: Secondary | ICD-10-CM | POA: Diagnosis not present

## 2020-12-10 DIAGNOSIS — Z96641 Presence of right artificial hip joint: Secondary | ICD-10-CM | POA: Diagnosis not present

## 2020-12-10 DIAGNOSIS — E7800 Pure hypercholesterolemia, unspecified: Secondary | ICD-10-CM | POA: Diagnosis not present

## 2020-12-10 DIAGNOSIS — G4733 Obstructive sleep apnea (adult) (pediatric): Secondary | ICD-10-CM | POA: Diagnosis not present

## 2020-12-10 NOTE — Discharge Summary (Signed)
Physician Discharge Summary   Patient ID: Sheila Blair MRN: [id number] DOB/AGE: [birth year] 83 y.o.  Admit date: 12/05/2020 Discharge date: 12/06/2020  Primary Diagnosis: Osteoarthritis of Right Hip  Admission Diagnoses:  Past Medical History:  Diagnosis Date  . Arthritis   . Atrial fibrillation ([place])   . Atypical nevus 09/21/2013   Mild-Left tricep  . Breast cancer ([place])    BILATERAL   . Complication of anesthesia   . Diabetes mellitus without complication ([place])    TYPE 2   . Difficult intubation    PT REPORTS NARROW AIRWAY BUT  HAS HAD 17 SURGERIES WITHOUT A PROBLEM - SURGERIES AT [facility] AND AT [facility] PER PT   . Dysrhythmia    AFIB   . Hypertension   . SCC (squamous cell carcinoma) 12/19/2019   right lower leg anterior-txpbx  . SCCA (squamous cell carcinoma) of skin 04/06/2017   RIGHT UPPER ARM-CX3,5FU  . Sleep apnea    CPAP    Discharge Diagnoses:   Principal Problem:   OA (osteoarthritis) of hip Active Problems:   Osteoarthritis of right hip  Estimated body mass index is 31.45 kg/m as calculated from the following:   Height as of this encounter: 5\' 7"  (1.702 m).   Weight as of this encounter: 91.1 kg.  Procedure:  Procedure(s) (LRB): TOTAL HIP ARTHROPLASTY ANTERIOR APPROACH (Right)   Consults: None  HPI: Sheila Blair is a 85 y.o. female who has advanced end-  stage arthritis of their Right  hip with progressively worsening pain and  dysfunction.The patient has failed nonoperative management and presents for  total hip arthroplasty.   Laboratory Data: Admission on 12/05/2020, Discharged on 12/06/2020  Component Date Value Ref Range Status  . ABO/RH(D) 12/05/2020    Final                   Value:O POS Performed at [facility], [place] [address], [place], [place] [address]   . Glucose-Capillary 12/05/2020 125* 70 - 99 mg/dL Final   Glucose reference range applies only to samples taken after fasting for at least 8 hours.  . WBC  12/06/2020 8.9  4.0 - 10.5 K/uL Final  . RBC 12/06/2020 3.82* 3.87 - 5.11 MIL/uL Final  . Hemoglobin 12/06/2020 12.0  12.0 - 15.0 g/dL Final  . HCT 12/06/2020 36.7  36.0 - 46.0 % Final  . MCV 12/06/2020 96.1  80.0 - 100.0 fL Final  . MCH 12/06/2020 31.4  26.0 - 34.0 pg Final  . MCHC 12/06/2020 32.7  30.0 - 36.0 g/dL Final  . RDW 12/06/2020 13.8  11.5 - 15.5 % Final  . Platelets 12/06/2020 170  150 - 400 K/uL Final  . nRBC 12/06/2020 0.0  0.0 - 0.2 % Final   Performed at [facility], [place] [address], [place], [place] [address]  . Sodium 12/06/2020 136  135 - 145 mmol/L Final  . Potassium 12/06/2020 3.4* 3.5 - 5.1 mmol/L Final  . Chloride 12/06/2020 100  98 - 111 mmol/L Final  . CO2 12/06/2020 31  22 - 32 mmol/L Final  . Glucose, Bld 12/06/2020 209* 70 - 99 mg/dL Final   Glucose reference range applies only to samples taken after fasting for at least 8 hours.  . BUN 12/06/2020 14  8 - 23 mg/dL Final  . Creatinine, Ser 12/06/2020 0.70  0.44 - 1.00 mg/dL Final  . Calcium 12/06/2020 8.7* 8.9 - 10.3 mg/dL Final  . GFR, Estimated 12/06/2020 >60  >60  mL/min Final   Comment: (NOTE) Calculated using the CKD-EPI Creatinine Equation (2021)   . Anion gap 12/06/2020 5  5 - 15 Final   Performed at [facility], [place] [address], [place], [place] [address]  Hospital Outpatient Visit on 12/01/2020  Component Date Value Ref Range Status  . SARS Coronavirus 2 12/01/2020 NEGATIVE  NEGATIVE Final   Comment: (NOTE) SARS-CoV-2 target nucleic acids are NOT DETECTED.  The SARS-CoV-2 RNA is generally detectable in upper and lower respiratory specimens during the acute phase of infection. Negative results do not preclude SARS-CoV-2 infection, do not rule out co-infections with other pathogens, and should not be used as the sole basis for treatment or other patient management decisions. Negative results must be combined with clinical observations, patient history, and  epidemiological information. The expected result is Negative.  Fact Sheet for Patients: SugarRoll.be  Fact Sheet for Healthcare Providers: https://www.woods-mathews.com/  This test is not yet approved or cleared by the [place] FDA and  has been authorized for detection and/or diagnosis of SARS-CoV-2 by FDA under an Emergency Use Authorization (EUA). This EUA will remain  in effect (meaning this test can be used) for the duration of the COVID-19 declaration under Se                          ction 564(b)(1) of the Act, 21 U.S.C. section 360bbb-3(b)(1), unless the authorization is terminated or revoked sooner.  Performed at [facility], [place] [address], [place], [place] [address]   Hospital Outpatient Visit on 11/26/2020  Component Date Value Ref Range Status  . MRSA, PCR 11/26/2020 NEGATIVE  NEGATIVE Final  . Staphylococcus aureus 11/26/2020 NEGATIVE  NEGATIVE Final   Comment: (NOTE) The Xpert SA Assay (FDA approved for NASAL specimens in patients 8 years of age and older), is one component of a comprehensive surveillance program. It is not intended to diagnose infection nor to guide or monitor treatment. Performed at [facility], [place] [address] [place], [place] [address]   . WBC 11/26/2020 6.1  4.0 - 10.5 K/uL Final  . RBC 11/26/2020 4.81  3.87 - 5.11 MIL/uL Final  . Hemoglobin 11/26/2020 15.0  12.0 - 15.0 g/dL Final  . HCT 11/26/2020 45.6  36.0 - 46.0 % Final  . MCV 11/26/2020 94.8  80.0 - 100.0 fL Final  . MCH 11/26/2020 31.2  26.0 - 34.0 pg Final  . MCHC 11/26/2020 32.9  30.0 - 36.0 g/dL Final  . RDW 11/26/2020 14.0  11.5 - 15.5 % Final  . Platelets 11/26/2020 203  150 - 400 K/uL Final  . nRBC 11/26/2020 0.0  0.0 - 0.2 % Final   Performed at [facility], [place] [address] [place], [place] [address]  . Sodium 11/26/2020 138  135 - 145 mmol/L Final  . Potassium 11/26/2020 3.9  3.5  - 5.1 mmol/L Final  . Chloride 11/26/2020 98  98 - 111 mmol/L Final  . CO2 11/26/2020 28  22 - 32 mmol/L Final  . Glucose, Bld 11/26/2020 119* 70 - 99 mg/dL Final   Glucose reference range applies only to samples taken after fasting for at least 8 hours.  . BUN 11/26/2020 14  8 - 23 mg/dL Final  . Creatinine, Ser 11/26/2020 0.85  0.44 - 1.00 mg/dL Final  . Calcium 11/26/2020 9.4  8.9 - 10.3 mg/dL Final  . Total Protein 11/26/2020 7.1  6.5 - 8.1 g/dL Final  .  Albumin 11/26/2020 4.1  3.5 - 5.0 g/dL Final  . AST 11/26/2020 30  15 - 41 U/L Final  . ALT 11/26/2020 28  0 - 44 U/L Final  . Alkaline Phosphatase 11/26/2020 76  38 - 126 U/L Final  . Total Bilirubin 11/26/2020 1.3* 0.3 - 1.2 mg/dL Final  . GFR, Estimated 11/26/2020 >60  >60 mL/min Final   Comment: (NOTE) Calculated using the CKD-EPI Creatinine Equation (2021)   . Anion gap 11/26/2020 12  5 - 15 Final   Performed at [facility], [place] [address] [place], [place] [address]  . Prothrombin Time 11/26/2020 16.3* 11.4 - 15.2 seconds Final  . INR 11/26/2020 1.4* 0.8 - 1.2 Final   Comment: (NOTE) INR goal varies based on device and disease states. Performed at [facility], [place] [address], [place], [place] [address]   . aPTT 11/26/2020 45* 24 - 36 seconds Final   Comment:        IF BASELINE aPTT IS ELEVATED, SUGGEST PATIENT RISK ASSESSMENT BE USED TO DETERMINE APPROPRIATE ANTICOAGULANT THERAPY. Performed at [facility], [place] [address], [place], [place] [address]   . ABO/RH(D) 11/26/2020 O POS   Final  . Antibody Screen 11/26/2020 NEG   Final  . Sample Expiration 11/26/2020 12/08/2020,2359   Final  . Extend sample reason 11/26/2020    Final                   Value:NO TRANSFUSIONS OR PREGNANCY IN THE PAST 3 MONTHS Performed at [place] [address] [place], [place] [address]      X-Rays:DG Pelvis Portable  Result Date: 12/05/2020 CLINICAL  DATA:  Right hip arthroplasty EXAM: PORTABLE PELVIS 1-2 VIEWS COMPARISON:  01/15/2012 FINDINGS: Interval postsurgical changes from right total hip arthroplasty. Arthroplasty components are in their expected alignment. No evidence of periprosthetic fracture. Expected postoperative changes within the overlying soft tissues. IMPRESSION: Satisfactory postoperative appearance status post right total hip arthroplasty. Electronically Signed   By: Davina Poke D.O.   On: 12/05/2020 13:21   DG C-Arm 1-60 Min-No Report  Result Date: 12/05/2020 Fluoroscopy was utilized by the requesting physician.  No radiographic interpretation.   DG HIP OPERATIVE UNILAT W OR W/O PELVIS RIGHT  Result Date: 12/05/2020 CLINICAL DATA:  RIGHT anterior hip replacement EXAM: OPERATIVE RIGHT HIP (WITH PELVIS IF PERFORMED) 2 VIEWS TECHNIQUE: Fluoroscopic spot image(s) were submitted for interpretation post-operatively. COMPARISON:  01/15/2012 FLUOROSCOPY TIME:  0 minutes 8 seconds Dose: 2.1176 mGy FINDINGS: Osseous demineralization. Components of a RIGHT hip prosthesis are identified. Curvilinear tape likely reflecting a surgical sponge projects over operative region; this is not identified on the postoperative image. No fracture or dislocation identified. IMPRESSION: RIGHT hip prosthesis without acute complication. Electronically Signed   By: Lavonia Dana M.D.   On: 12/05/2020 14:12    EKG: Orders placed or performed during the hospital encounter of 12/05/20  . EKG     Hospital Course: Sheila Blair is a 85 y.o. who was admitted to [facility]. They were brought to the operating room on 12/05/2020 and underwent Procedure(s): [place].  Patient tolerated the procedure well and was later transferred to the recovery room and then to the orthopaedic floor for postoperative care. They were given PO and IV analgesics for pain control following their surgery. They were given 24 hours of  postoperative antibiotics of  Anti-infectives (From admission, onward)   Start  Dose/Rate Route Frequency Ordered Stop   12/05/20 1700  ceFAZolin (ANCEF) IVPB 2g/100 mL premix        2 g 200 mL/hr over 30 Minutes Intravenous Every 6 hours 12/05/20 1356 12/05/20 2305   12/05/20 0945  ceFAZolin (ANCEF) IVPB 2g/100 mL premix        2 g 200 mL/hr over 30 Minutes Intravenous On call to O.R. 12/05/20 8676 12/05/20 1047     and started on DVT prophylaxis in the form of Pradaxa.   PT and OT were ordered for total joint protocol. Discharge planning consulted to help with postop disposition and equipment needs.  Patient had an uneventful night on the evening of surgery. They started to get up OOB with therapy on 12/05/20. Pt was seen during rounds and was ready to go home pending progress with therapy. She worked with therapy on POD #1 and was meeting goals. Pt was discharged to home later that day in stable condition.  Diet: Regular diet Activity: WBAT Follow-up: in two weeks Disposition: Home Discharged Condition: good   Discharge Instructions    Call MD / Call 911   Complete by: As directed    If you experience chest pain or shortness of breath, CALL 911 and be transported to the hospital emergency room.  If you develope a fever above 101 F, pus (white drainage) or increased drainage or redness at the wound, or calf pain, call your surgeon's office.   Change dressing   Complete by: As directed    You have an adhesive waterproof bandage over the incision. Leave this in place until your first follow-up appointment. Once you remove this you will not need to place another bandage.   Constipation Prevention   Complete by: As directed    Drink plenty of fluids.  Prune juice may be helpful.  You may use a stool softener, such as Colace (over the counter) 100 mg twice a day.  Use MiraLax (over the counter) for constipation as needed.   Diet - low sodium heart healthy   Complete by: As directed    Do  not sit on low chairs, stoools or toilet seats, as it may be difficult to get up from low surfaces   Complete by: As directed    Driving restrictions   Complete by: As directed    No driving for two weeks   TED hose   Complete by: As directed    Use stockings (TED hose) for three weeks on both leg(s).  You may remove them at night for sleeping.   Weight bearing as tolerated   Complete by: As directed      Allergies as of 12/06/2020      Reactions   Codeine Shortness Of Breath   Morphine And Related Shortness Of Breath      Medication List    TAKE these medications   anastrozole 1 MG tablet Commonly known as: ARIMIDEX Take 1 tablet (1 mg total) by mouth daily.   atorvastatin 10 MG tablet Commonly known as: LIPITOR Take 10 mg by mouth at bedtime.   Biotin 1000 MCG tablet Take 1,000 mcg by mouth daily.   calcium carbonate 1250 (500 Ca) MG tablet Commonly known as: OS-CAL - dosed in mg of elemental calcium Take 1 tablet by mouth daily with breakfast.   cholecalciferol 25 MCG (1000 UNIT) tablet Commonly known as: VITAMIN D3 Take 1,000 Units by mouth daily.   citalopram 20 MG tablet Commonly known as: CELEXA Take 20 mg  by mouth daily.   diltiazem 180 MG 24 hr capsule Commonly known as: CARDIZEM CD Take 180 mg by mouth in the morning and at bedtime.   docusate sodium 100 MG capsule Commonly known as: COLACE Take 100 mg by mouth 2 (two) times daily.   hydrochlorothiazide 25 MG tablet Commonly known as: HYDRODIURIL Take 25 mg by mouth daily.   HYDROcodone-acetaminophen 5-325 MG tablet Commonly known as: NORCO/VICODIN Take 1-2 tablets by mouth every 6 (six) hours as needed for severe pain.   latanoprost 0.005 % ophthalmic solution Commonly known as: XALATAN Place 1 drop into the right eye at bedtime.   methocarbamol 500 MG tablet Commonly known as: ROBAXIN Take 1 tablet (500 mg total) by mouth every 6 (six) hours as needed for muscle spasms.   metoprolol  tartrate 100 MG tablet Commonly known as: LOPRESSOR Take 100 mg by mouth 2 (two) times daily.   olmesartan 20 MG tablet Commonly known as: BENICAR Take 20 mg by mouth daily.   Polyethyl Glycol-Propyl Glycol 0.4-0.3 % Soln Place 1 drop into both eyes 2 (two) times daily as needed (dry eyes).   Pradaxa 150 MG Caps capsule Generic drug: dabigatran Take 150 mg by mouth 2 (two) times daily.   traMADol 50 MG tablet Commonly known as: ULTRAM Take 50 mg by mouth 3 (three) times daily as needed for severe pain.            Discharge Care Instructions  (From admission, onward)         Start     Ordered   12/06/20 0000  Weight bearing as tolerated        12/06/20 0819   12/06/20 0000  Change dressing       Comments: You have an adhesive waterproof bandage over the incision. Leave this in place until your first follow-up appointment. Once you remove this you will not need to place another bandage.   12/06/20 2482          Follow-up Information    Gaynelle Arabian, MD. Go on 12/18/2020.   Specialty: Orthopedic Surgery Why: You are scheduled for first post op appointment on Tuesday March 15th at 2:00pm. Contact information: [address] [place] [address] [phone]        [facility] Follow up.   Specialty: [place] Why: to provide home health physical therapy Contact information: [address] [place] [address] [phone]               Signed: Fenton Foy, MBA, PA-C Orthopedic Surgery 12/10/2020, 9:58 AM

## 2020-12-12 DIAGNOSIS — Z96641 Presence of right artificial hip joint: Secondary | ICD-10-CM | POA: Diagnosis not present

## 2020-12-12 DIAGNOSIS — I4891 Unspecified atrial fibrillation: Secondary | ICD-10-CM | POA: Diagnosis not present

## 2020-12-12 DIAGNOSIS — E7800 Pure hypercholesterolemia, unspecified: Secondary | ICD-10-CM | POA: Diagnosis not present

## 2020-12-12 DIAGNOSIS — Z471 Aftercare following joint replacement surgery: Secondary | ICD-10-CM | POA: Diagnosis not present

## 2020-12-12 DIAGNOSIS — C50912 Malignant neoplasm of unspecified site of left female breast: Secondary | ICD-10-CM | POA: Diagnosis not present

## 2020-12-12 DIAGNOSIS — G4733 Obstructive sleep apnea (adult) (pediatric): Secondary | ICD-10-CM | POA: Diagnosis not present

## 2020-12-12 DIAGNOSIS — I10 Essential (primary) hypertension: Secondary | ICD-10-CM | POA: Diagnosis not present

## 2020-12-12 DIAGNOSIS — C50911 Malignant neoplasm of unspecified site of right female breast: Secondary | ICD-10-CM | POA: Diagnosis not present

## 2020-12-12 DIAGNOSIS — E119 Type 2 diabetes mellitus without complications: Secondary | ICD-10-CM | POA: Diagnosis not present

## 2020-12-14 DIAGNOSIS — C50912 Malignant neoplasm of unspecified site of left female breast: Secondary | ICD-10-CM | POA: Diagnosis not present

## 2020-12-14 DIAGNOSIS — Z471 Aftercare following joint replacement surgery: Secondary | ICD-10-CM | POA: Diagnosis not present

## 2020-12-14 DIAGNOSIS — C50911 Malignant neoplasm of unspecified site of right female breast: Secondary | ICD-10-CM | POA: Diagnosis not present

## 2020-12-14 DIAGNOSIS — E119 Type 2 diabetes mellitus without complications: Secondary | ICD-10-CM | POA: Diagnosis not present

## 2020-12-14 DIAGNOSIS — Z96641 Presence of right artificial hip joint: Secondary | ICD-10-CM | POA: Diagnosis not present

## 2020-12-14 DIAGNOSIS — E7800 Pure hypercholesterolemia, unspecified: Secondary | ICD-10-CM | POA: Diagnosis not present

## 2020-12-14 DIAGNOSIS — I4891 Unspecified atrial fibrillation: Secondary | ICD-10-CM | POA: Diagnosis not present

## 2020-12-14 DIAGNOSIS — I10 Essential (primary) hypertension: Secondary | ICD-10-CM | POA: Diagnosis not present

## 2020-12-14 DIAGNOSIS — G4733 Obstructive sleep apnea (adult) (pediatric): Secondary | ICD-10-CM | POA: Diagnosis not present

## 2020-12-17 DIAGNOSIS — G4733 Obstructive sleep apnea (adult) (pediatric): Secondary | ICD-10-CM | POA: Diagnosis not present

## 2020-12-17 DIAGNOSIS — E7800 Pure hypercholesterolemia, unspecified: Secondary | ICD-10-CM | POA: Diagnosis not present

## 2020-12-17 DIAGNOSIS — E119 Type 2 diabetes mellitus without complications: Secondary | ICD-10-CM | POA: Diagnosis not present

## 2020-12-17 DIAGNOSIS — I4891 Unspecified atrial fibrillation: Secondary | ICD-10-CM | POA: Diagnosis not present

## 2020-12-17 DIAGNOSIS — Z96641 Presence of right artificial hip joint: Secondary | ICD-10-CM | POA: Diagnosis not present

## 2020-12-17 DIAGNOSIS — C50911 Malignant neoplasm of unspecified site of right female breast: Secondary | ICD-10-CM | POA: Diagnosis not present

## 2020-12-17 DIAGNOSIS — Z471 Aftercare following joint replacement surgery: Secondary | ICD-10-CM | POA: Diagnosis not present

## 2020-12-17 DIAGNOSIS — I10 Essential (primary) hypertension: Secondary | ICD-10-CM | POA: Diagnosis not present

## 2020-12-17 DIAGNOSIS — C50912 Malignant neoplasm of unspecified site of left female breast: Secondary | ICD-10-CM | POA: Diagnosis not present

## 2020-12-19 DIAGNOSIS — G4733 Obstructive sleep apnea (adult) (pediatric): Secondary | ICD-10-CM | POA: Diagnosis not present

## 2020-12-19 DIAGNOSIS — Z471 Aftercare following joint replacement surgery: Secondary | ICD-10-CM | POA: Diagnosis not present

## 2020-12-19 DIAGNOSIS — I10 Essential (primary) hypertension: Secondary | ICD-10-CM | POA: Diagnosis not present

## 2020-12-19 DIAGNOSIS — C50911 Malignant neoplasm of unspecified site of right female breast: Secondary | ICD-10-CM | POA: Diagnosis not present

## 2020-12-19 DIAGNOSIS — Z96641 Presence of right artificial hip joint: Secondary | ICD-10-CM | POA: Diagnosis not present

## 2020-12-19 DIAGNOSIS — C50912 Malignant neoplasm of unspecified site of left female breast: Secondary | ICD-10-CM | POA: Diagnosis not present

## 2020-12-19 DIAGNOSIS — E7800 Pure hypercholesterolemia, unspecified: Secondary | ICD-10-CM | POA: Diagnosis not present

## 2020-12-19 DIAGNOSIS — E119 Type 2 diabetes mellitus without complications: Secondary | ICD-10-CM | POA: Diagnosis not present

## 2020-12-19 DIAGNOSIS — I4891 Unspecified atrial fibrillation: Secondary | ICD-10-CM | POA: Diagnosis not present

## 2021-01-08 DIAGNOSIS — Z96641 Presence of right artificial hip joint: Secondary | ICD-10-CM | POA: Diagnosis not present

## 2021-01-08 DIAGNOSIS — Z471 Aftercare following joint replacement surgery: Secondary | ICD-10-CM | POA: Diagnosis not present

## 2021-01-16 DIAGNOSIS — G4733 Obstructive sleep apnea (adult) (pediatric): Secondary | ICD-10-CM | POA: Diagnosis not present

## 2021-02-12 DIAGNOSIS — M1712 Unilateral primary osteoarthritis, left knee: Secondary | ICD-10-CM | POA: Diagnosis not present

## 2021-02-18 ENCOUNTER — Ambulatory Visit: Payer: Medicare PPO | Admitting: Hematology and Oncology

## 2021-02-18 ENCOUNTER — Telehealth: Payer: Self-pay | Admitting: Hematology and Oncology

## 2021-02-18 DIAGNOSIS — H401112 Primary open-angle glaucoma, right eye, moderate stage: Secondary | ICD-10-CM | POA: Diagnosis not present

## 2021-02-18 DIAGNOSIS — H43391 Other vitreous opacities, right eye: Secondary | ICD-10-CM | POA: Diagnosis not present

## 2021-02-18 DIAGNOSIS — H524 Presbyopia: Secondary | ICD-10-CM | POA: Diagnosis not present

## 2021-02-18 DIAGNOSIS — H1013 Acute atopic conjunctivitis, bilateral: Secondary | ICD-10-CM | POA: Diagnosis not present

## 2021-02-18 DIAGNOSIS — Z961 Presence of intraocular lens: Secondary | ICD-10-CM | POA: Diagnosis not present

## 2021-02-18 NOTE — Telephone Encounter (Signed)
Scheduled appt per 5/12 sch msg. Pt aware.

## 2021-02-21 DIAGNOSIS — M1712 Unilateral primary osteoarthritis, left knee: Secondary | ICD-10-CM | POA: Diagnosis not present

## 2021-02-28 DIAGNOSIS — M1712 Unilateral primary osteoarthritis, left knee: Secondary | ICD-10-CM | POA: Diagnosis not present

## 2021-03-01 ENCOUNTER — Encounter (INDEPENDENT_AMBULATORY_CARE_PROVIDER_SITE_OTHER): Payer: Self-pay

## 2021-03-01 ENCOUNTER — Encounter: Payer: Self-pay | Admitting: Cardiology

## 2021-03-01 ENCOUNTER — Other Ambulatory Visit: Payer: Self-pay

## 2021-03-01 ENCOUNTER — Ambulatory Visit: Payer: Medicare PPO | Admitting: Cardiology

## 2021-03-01 VITALS — BP 144/80 | HR 72 | Ht 68.0 in | Wt 206.0 lb

## 2021-03-01 DIAGNOSIS — I4891 Unspecified atrial fibrillation: Secondary | ICD-10-CM

## 2021-03-01 DIAGNOSIS — I10 Essential (primary) hypertension: Secondary | ICD-10-CM | POA: Diagnosis not present

## 2021-03-01 DIAGNOSIS — I4821 Permanent atrial fibrillation: Secondary | ICD-10-CM

## 2021-03-01 MED ORDER — METOPROLOL TARTRATE 50 MG PO TABS: 50.0000 mg | ORAL_TABLET | Freq: Two times a day (BID) | ORAL | 3 refills | Status: DC

## 2021-03-01 NOTE — Progress Notes (Signed)
Cardiology Office Note  Date: 03/01/2021   ID: Sheila Blair, DOB January 29, 1936, MRN [id number]  PCP:  Asencion Noble, MD  Cardiologist:  Rozann Lesches, MD Electrophysiologist:  None   Chief Complaint  Patient presents with  . Establish follow-up of atrial fibrillation    History of Present Illness: Sheila Blair is an 85 y.o. female referred for cardiology consultation by Dr. Willey Blade for the evaluation of atrial fibrillation.  She presents today for evaluation.  States that she was diagnosed with atrial fibrillation back in 2008 during which time she was undergoing treatment for breast cancer including radiation and chemotherapy.  She has been managed with strategy of heart rate control and anticoagulation with follow-up by Dr. Willey Blade over time.  She had right hip replacement back in March, reportedly while hospitalized had some issues with her blood pressure and was told to hold metoprolol.  She did for about 2 weeks, however her blood pressure ultimately increased and she started back on metoprolol 50 mg twice daily along with Cardizem CD 180 mg daily.  She has tolerated this regimen since that time.  She is on Pradaxa for stroke prophylaxis with CHA2DS2-VASc score of 6.  We went over her medications.  I also reviewed her interval lab work and notes from Dr. Willey Blade.  She has not undergone a recent echocardiogram.  Past Medical History:  Diagnosis Date  . Anxiety   . Arthritis   . Atrial fibrillation ([place])   . Atypical nevus 09/21/2013   Mild-Left tricep  . Breast cancer ([place])    BILATERAL   . Difficult intubation    PT REPORTS NARROW AIRWAY BUT  HAS HAD 17 SURGERIES WITHOUT A PROBLEM - SURGERIES AT [facility] AND AT [facility] PER PT   . Essential hypertension   . Hypothyroidism   . SCC (squamous cell carcinoma) 12/19/2019   right lower leg anterior-txpbx  . SCCA (squamous cell carcinoma) of skin 04/06/2017   RIGHT UPPER ARM-CX3,5FU  . Sleep apnea    CPAP   . Type 2 diabetes  mellitus ([place])     Past Surgical History:  Procedure Laterality Date  . ABDOMINAL HYSTERECTOMY    . ABDOMINAL SURGERY    . APPENDECTOMY    . benign thyroid tumor    . BREAST LUMPECTOMY    . BREAST SURGERY    . CATARACT EXTRACTION    . CHOLECYSTECTOMY    . COLONOSCOPY N/A 07/10/2016   Procedure: COLONOSCOPY;  Surgeon: Rogene Houston, MD;  Location: [facility]ITE;  Service: Endoscopy;  Laterality: N/A;  930 - moved to 11/2 @ 9:30  . EYE SURGERY  2015   to correct drooping eye lids  . HERNIA REPAIR    . pseudoptosis Bilateral   . rectocyle surgery    . SMALL INTESTINE SURGERY    . TOTAL HIP ARTHROPLASTY Right 12/05/2020   Procedure: TOTAL HIP ARTHROPLASTY ANTERIOR APPROACH;  Surgeon: Gaynelle Arabian, MD;  Location: [facility] ORS;  Service: Orthopedics;  Laterality: Right;  128min  . VEIN LIGATION Right     Current Outpatient Medications  Medication Sig Dispense Refill  . anastrozole (ARIMIDEX) 1 MG tablet Take 1 tablet (1 mg total) by mouth daily. 90 tablet 3  . atorvastatin (LIPITOR) 10 MG tablet Take 10 mg by mouth at bedtime.    . Biotin 1000 MCG tablet Take 1,000 mcg by mouth daily.    . calcium carbonate (OS-CAL - DOSED IN MG OF ELEMENTAL CALCIUM) 1250 (500 Ca) MG tablet Take  1 tablet by mouth daily with breakfast.    . cholecalciferol (VITAMIN D3) 25 MCG (1000 UNIT) tablet Take 1,000 Units by mouth daily.    . citalopram (CELEXA) 20 MG tablet Take 20 mg by mouth daily.    Marland Kitchen diltiazem (TIAZAC) 180 MG 24 hr capsule Take 180 mg by mouth at bedtime.    . docusate sodium (COLACE) 100 MG capsule Take 100 mg by mouth 2 (two) times daily.    . hydrochlorothiazide (HYDRODIURIL) 25 MG tablet Take 25 mg by mouth daily.    Marland Kitchen HYDROcodone-acetaminophen (NORCO/VICODIN) 5-325 MG tablet Take 1-2 tablets by mouth every 6 (six) hours as needed for severe pain. 42 tablet 0  . latanoprost (XALATAN) 0.005 % ophthalmic solution Place 1 drop into the right eye at bedtime.    . methocarbamol (ROBAXIN) 500 MG  tablet Take 1 tablet (500 mg total) by mouth every 6 (six) hours as needed for muscle spasms. 40 tablet 0  . metoprolol tartrate (LOPRESSOR) 50 MG tablet Take 1 tablet (50 mg total) by mouth 2 (two) times daily. 180 tablet 3  . Polyethyl Glycol-Propyl Glycol 0.4-0.3 % SOLN Place 1 drop into both eyes 2 (two) times daily as needed (dry eyes).    Marland Kitchen PRADAXA 150 MG CAPS capsule Take 150 mg by mouth 2 (two) times daily.    . traMADol (ULTRAM) 50 MG tablet Take 50 mg by mouth 3 (three) times daily as needed for severe pain.     No current facility-administered medications for this visit.   Allergies:  Codeine and Morphine and related   Social History: The patient  reports that she has never smoked. She has never used smokeless tobacco. She reports that she does not drink alcohol and does not use drugs.   Family History: The patient's family history includes Congestive Heart Failure in her mother; Dementia in her sister.   ROS: No dizziness or syncope.  Using a cane to ambulate.  No recent falls.  Physical Exam: VS:  BP (!) 144/80 (BP Location: Left Arm)   Pulse 72   Ht 5\' 8"  (1.727 m)   Wt 206 lb (93.4 kg)   SpO2 94%   BMI 31.32 kg/m , BMI Body mass index is 31.32 kg/m.  Wt Readings from Last 3 Encounters:  03/01/21 206 lb (93.4 kg)  12/05/20 200 lb 12.8 oz (91.1 kg)  10/10/20 202 lb 14.4 oz (92 kg)    General: Elderly woman, appears comfortable at rest. HEENT: Conjunctiva and lids normal, wearing a mask. Neck: Supple, no elevated JVP or carotid bruits, no thyromegaly. Lungs: Clear to auscultation, nonlabored breathing at rest. Cardiac: Irregularly irregular, no S3, 1/6 systolic murmur, no pericardial rub. Abdomen: Soft, nontender, bowel sounds present. Extremities: No pitting edema, distal pulses 2+. Skin: Warm and dry. Musculoskeletal: No kyphosis. Neuropsychiatric: Alert and oriented x3, affect grossly appropriate.  ECG:  An ECG dated 12/07/2020 was personally reviewed today and  demonstrated:  Rate controlled atrial fibrillation.  Recent Labwork: 11/26/2020: ALT 28; AST 30 12/06/2020: BUN 14; Creatinine, Ser 0.70; Hemoglobin 12.0; Platelets 170; Potassium 3.4; Sodium 136  January 2022: BUN 10, creatinine 0.9, potassium 4.5, AST 18, ALT 19, hemoglobin 15.0, platelets 310, hemoglobin A1c 6.2%  Other Studies Reviewed Today:  No prior cardiac testing for review today.  Assessment and Plan:  1.  Permanent atrial fibrillation with CHA2DS2-VASc score of 6.  Dr. Willey Blade has done a nice job with her management, and I agree with her current medications and doses.  She is on a combination of Cardizem CD and metoprolol for heart rate control and Pradaxa for stroke prophylaxis.  We will obtain an echocardiogram to evaluate cardiac structure and function at this point.  2.  Acquired thrombophilia, on Pradaxa for stroke prophylaxis.  No spontaneous bleeding problems.  Last hemoglobin 12.0 around the time of hip surgery in March.  3.  Essential hypertension, systolic is in the 270J today.  No changes to current regimen which also includes HCTZ.  Medication Adjustments/Labs and Tests Ordered: Current medicines are reviewed at length with the patient today.  Concerns regarding medicines are outlined above.   Tests Ordered: Orders Placed This Encounter  Procedures  . ECHOCARDIOGRAM COMPLETE    Medication Changes: Meds ordered this encounter  Medications  . metoprolol tartrate (LOPRESSOR) 50 MG tablet    Sig: Take 1 tablet (50 mg total) by mouth 2 (two) times daily.    Dispense:  180 tablet    Refill:  3    Disposition:  Follow up 6 months.  Signed, Satira Sark, MD, [facility] 03/01/2021 1:52 PM    [facility] [address] [place], [place] [address] Phone: [phone]; Fax: [phone]

## 2021-03-01 NOTE — Patient Instructions (Signed)
Medication Instructions:  Your physician recommends that you continue on your current medications as directed. Please refer to the Current Medication list given to you today.  *If you need a refill on your cardiac medications before your next appointment, please call your pharmacy*   Lab Work: None today If you have labs (blood work) drawn today and your tests are completely normal, you will receive your results only by: . MyChart Message (if you have MyChart) OR . A paper copy in the mail If you have any lab test that is abnormal or we need to change your treatment, we will call you to review the results.   Testing/Procedures: Your physician has requested that you have an echocardiogram. Echocardiography is a painless test that uses sound waves to create images of your heart. It provides your doctor with information about the size and shape of your heart and how well your heart's chambers and valves are working. This procedure takes approximately one hour. There are no restrictions for this procedure.     Follow-Up: At [facility], you and your health needs are our priority.  As part of our continuing mission to provide you with exceptional heart care, we have created designated Provider Care Teams.  These Care Teams include your primary Cardiologist (physician) and Advanced Practice Providers (APPs -  Physician Assistants and Nurse Practitioners) who all work together to provide you with the care you need, when you need it.  We recommend signing up for the patient portal called "MyChart".  Sign up information is provided on this After Visit Summary.  MyChart is used to connect with patients for Virtual Visits (Telemedicine).  Patients are able to view lab/test results, encounter notes, upcoming appointments, etc.  Non-urgent messages can be sent to your provider as well.   To learn more about what you can do with MyChart, go to [email or web address].    Your next appointment:   6  month(s)  The format for your next appointment:   In Person  Provider:   Samuel McDowell, MD   Other Instructions None       Thank you for choosing [facility] !         

## 2021-03-25 ENCOUNTER — Ambulatory Visit
Admission: RE | Admit: 2021-03-25 | Discharge: 2021-03-25 | Disposition: A | Discharge status: Home / Self Care | Payer: Medicare PPO | Source: Ambulatory Visit | Attending: Hematology and Oncology | Admitting: Hematology and Oncology

## 2021-03-25 ENCOUNTER — Other Ambulatory Visit: Payer: Self-pay

## 2021-03-25 DIAGNOSIS — R922 Inconclusive mammogram: Secondary | ICD-10-CM | POA: Diagnosis not present

## 2021-03-25 DIAGNOSIS — C50911 Malignant neoplasm of unspecified site of right female breast: Secondary | ICD-10-CM

## 2021-03-25 DIAGNOSIS — C50912 Malignant neoplasm of unspecified site of left female breast: Secondary | ICD-10-CM

## 2021-03-26 ENCOUNTER — Ambulatory Visit (HOSPITAL_COMMUNITY)
Admission: RE | Admit: 2021-03-26 | Discharge: 2021-03-26 | Disposition: A | Discharge status: Home / Self Care | Payer: Medicare PPO | Source: Ambulatory Visit | Attending: Cardiology | Admitting: Cardiology

## 2021-03-26 DIAGNOSIS — I4891 Unspecified atrial fibrillation: Secondary | ICD-10-CM | POA: Diagnosis not present

## 2021-03-26 LAB — ECHOCARDIOGRAM COMPLETE
AR max vel: 2.17 cm2
AV Area VTI: 2.3 cm2
AV Area mean vel: 2.34 cm2
AV Mean grad: 4.2 mmHg
AV Peak grad: 8.5 mmHg
Ao pk vel: 1.46 m/s
Area-P 1/2: 4.12 cm2
MV M vel: 5.13 m/s
MV Peak grad: 105.3 mmHg
P 1/2 time: 535 msec
Radius: 0.5 cm
S' Lateral: 3.2 cm

## 2021-03-26 NOTE — Progress Notes (Signed)
*  PRELIMINARY RESULTS* Echocardiogram 2D Echocardiogram has been performed.  Samuel Germany 03/26/2021, 2:49 PM

## 2021-03-31 NOTE — Progress Notes (Signed)
Patient Care Team: Asencion Noble, MD as PCP - General (Internal Medicine) Satira Sark, MD as PCP - Cardiology (Cardiology)  DIAGNOSIS:    ICD-10-CM   1. Bilateral malignant neoplasm of breast in female, estrogen receptor positive, unspecified site of breast ([place])  C50.911    Z17.0    C50.912       SUMMARY OF ONCOLOGIC HISTORY: Oncology History  Bilateral breast cancer ([place])  2009 Initial Diagnosis   Left breast cancer 2009: Grade 2 IDC 1.3 cm, triple negative, 1/5 lymph node micrometastases, adjuvant radiation, 2 cycles of Taxol and carboplatin discontinued for sepsis   2019 Relapse/Recurrence   Right breast cancer 07/2018: Status post lumpectomy 08/19/2018: Invasive papillary cancer 1.2 cm T1CN0 ER/PR positive HER-2 negative status post radiation completed 10/26/2018 [facility])   11/15/2018 -  Anti-estrogen oral therapy   Anastrozole     CHIEF COMPLIANT:  Follow-up of left breast cancer on anastrozole,    INTERVAL HISTORY: Sheila Blair is a 85 y.o. with above-mentioned history of left breast cancer in 2009 and right breast cancer in 2019 who is currently on antiestrogen therapy with anastrozole. She presents to the clinic today for follow-up.   ALLERGIES:  is allergic to codeine and morphine and related.  MEDICATIONS:  Current Outpatient Medications  Medication Sig Dispense Refill   anastrozole (ARIMIDEX) 1 MG tablet Take 1 tablet (1 mg total) by mouth daily. 90 tablet 3   atorvastatin (LIPITOR) 10 MG tablet Take 10 mg by mouth at bedtime.     Biotin 1000 MCG tablet Take 1,000 mcg by mouth daily.     calcium carbonate (OS-CAL - DOSED IN MG OF ELEMENTAL CALCIUM) 1250 (500 Ca) MG tablet Take 1 tablet by mouth daily with breakfast.     cholecalciferol (VITAMIN D3) 25 MCG (1000 UNIT) tablet Take 1,000 Units by mouth daily.     citalopram (CELEXA) 20 MG tablet Take 20 mg by mouth daily.     diltiazem (TIAZAC) 180 MG 24 hr capsule Take 180 mg by mouth at bedtime.      docusate sodium (COLACE) 100 MG capsule Take 100 mg by mouth 2 (two) times daily.     hydrochlorothiazide (HYDRODIURIL) 25 MG tablet Take 25 mg by mouth daily.     HYDROcodone-acetaminophen (NORCO/VICODIN) 5-325 MG tablet Take 1-2 tablets by mouth every 6 (six) hours as needed for severe pain. 42 tablet 0   latanoprost (XALATAN) 0.005 % ophthalmic solution Place 1 drop into the right eye at bedtime.     methocarbamol (ROBAXIN) 500 MG tablet Take 1 tablet (500 mg total) by mouth every 6 (six) hours as needed for muscle spasms. 40 tablet 0   metoprolol tartrate (LOPRESSOR) 50 MG tablet Take 1 tablet (50 mg total) by mouth 2 (two) times daily. 180 tablet 3   Polyethyl Glycol-Propyl Glycol 0.4-0.3 % SOLN Place 1 drop into both eyes 2 (two) times daily as needed (dry eyes).     PRADAXA 150 MG CAPS capsule Take 150 mg by mouth 2 (two) times daily.     traMADol (ULTRAM) 50 MG tablet Take 50 mg by mouth 3 (three) times daily as needed for severe pain.     No current facility-administered medications for this visit.    PHYSICAL EXAMINATION: ECOG PERFORMANCE STATUS: 1 - Symptomatic but completely ambulatory  Vitals:   04/01/21 1423  BP: (!) 159/85  Pulse: 95  Resp: 19  Temp: 97.8 F (36.6 C)  SpO2: 98%   Filed Weights  04/01/21 1423  Weight: 205 lb 14.4 oz (93.4 kg)    BREAST: No palpable masses or nodules in either right or left breasts. No palpable axillary supraclavicular or infraclavicular adenopathy no breast tenderness or nipple discharge. (exam performed in the presence of a chaperone)  LABORATORY DATA:  I have reviewed the data as listed CMP Latest Ref Rng & Units 12/06/2020 11/26/2020 07/17/2013  Glucose 70 - 99 mg/dL 209(H) 119(H) 144(H)  BUN 8 - 23 mg/dL 14 14 24(H)  Creatinine 0.44 - 1.00 mg/dL 0.70 0.85 0.95  Sodium 135 - 145 mmol/L 136 138 138  Potassium 3.5 - 5.1 mmol/L 3.4(L) 3.9 3.9  Chloride 98 - 111 mmol/L 100 98 97  CO2 22 - 32 mmol/L '31 28 28  ' Calcium 8.9 - 10.3  mg/dL 8.7(L) 9.4 9.0  Total Protein 6.5 - 8.1 g/dL - 7.1 -  Total Bilirubin 0.3 - 1.2 mg/dL - 1.3(H) -  Alkaline Phos 38 - 126 U/L - 76 -  AST 15 - 41 U/L - 30 -  ALT 0 - 44 U/L - 28 -    Lab Results  Component Value Date   WBC 8.9 12/06/2020   HGB 12.0 12/06/2020   HCT 36.7 12/06/2020   MCV 96.1 12/06/2020   PLT 170 12/06/2020   NEUTROABS 7.1 07/17/2013    ASSESSMENT & PLAN:  Bilateral breast cancer ([place]) History of bilateral breast cancers Left breast cancer 2009: Grade 2 IDC 1.3 cm, triple negative, 1/5 lymph node micrometastases, adjuvant radiation, 2 cycles of Taxol and carboplatin discontinued for sepsis Right breast cancer 07/2018: Status post lumpectomy 08/19/2018: Invasive papillary cancer 1.2 cm T1CN0 ER/PR positive HER-2 negative status post radiation completed 10/26/2018   Current treatment: Anastrozole started 11/15/2018 Anastrozole toxicities: Benign  Breast cancer surveillance: 1.  Breast exam 04/01/2021: Benign 2. Mammogram done in April 2021 at [facility]: Benign she has not had a mammogram this year.. 3.  Bone density 12/01/2018: T score -1.4: Osteopenia   Return to clinic in 1 year for follow-up    No orders of the defined types were placed in this encounter.  The patient has a good understanding of the overall plan. she agrees with it. she will call with any problems that may develop before the next visit here.  Total time spent: 20 mins including face to face time and time spent for planning, charting and coordination of care  Rulon Eisenmenger, MD, MPH 04/01/2021  I, Thana Ates, am acting as scribe for Dr. Nicholas Lose.  I have reviewed the above documentation for accuracy and completeness, and I agree with the above.

## 2021-04-01 ENCOUNTER — Inpatient Hospital Stay: Payer: Medicare PPO | Attending: Hematology and Oncology | Admitting: Hematology and Oncology

## 2021-04-01 ENCOUNTER — Other Ambulatory Visit: Payer: Self-pay

## 2021-04-01 DIAGNOSIS — M8580 Other specified disorders of bone density and structure, unspecified site: Secondary | ICD-10-CM | POA: Diagnosis not present

## 2021-04-01 DIAGNOSIS — C50912 Malignant neoplasm of unspecified site of left female breast: Secondary | ICD-10-CM | POA: Diagnosis not present

## 2021-04-01 DIAGNOSIS — Z923 Personal history of irradiation: Secondary | ICD-10-CM | POA: Insufficient documentation

## 2021-04-01 DIAGNOSIS — C50911 Malignant neoplasm of unspecified site of right female breast: Secondary | ICD-10-CM | POA: Insufficient documentation

## 2021-04-01 DIAGNOSIS — Z853 Personal history of malignant neoplasm of breast: Secondary | ICD-10-CM | POA: Diagnosis not present

## 2021-04-01 DIAGNOSIS — Z170 Estrogen receptor positive status [ER+]: Secondary | ICD-10-CM | POA: Diagnosis not present

## 2021-04-01 NOTE — Assessment & Plan Note (Signed)
History of bilateral breast cancers Left breast cancer 2009: Grade 2 IDC 1.3 cm, triple negative, 1/5 lymph node micrometastases, adjuvant radiation, 2 cycles of Taxol and carboplatin discontinued for sepsis Right breast cancer 07/2018: Status post lumpectomy 08/19/2018: Invasive papillary cancer 1.2 cm T1CN0 ER/PR positive HER-2 negative status post radiation completed 10/26/2018  Current treatment: Anastrozole started 11/15/2018  Discoloration of the left breast: No palpable lumps or nodules.  The discoloration has faded away.  It could be related to current cold weather.  Instructed her to apply moisturizing creams regularly.   Breast cancer surveillance: 1.Breast exam 04/01/2021: Benign 2.[facility]done in April [facility] at [facility]: Benign she has not had a mammogram this year.Marland Kitchen 3.Bone density 12/01/2018: T score -1.4: Osteopenia  Return to clinic in 1 year for follow-up

## 2021-04-17 DIAGNOSIS — G4733 Obstructive sleep apnea (adult) (pediatric): Secondary | ICD-10-CM | POA: Diagnosis not present

## 2021-05-14 ENCOUNTER — Other Ambulatory Visit: Payer: Self-pay | Admitting: Hematology and Oncology

## 2021-07-16 ENCOUNTER — Inpatient Hospital Stay: Payer: Medicare PPO | Admitting: Hematology and Oncology

## 2021-07-16 NOTE — Assessment & Plan Note (Deleted)
History of bilateral breast cancers Left breast cancer 2009: Grade 2 IDC 1.3 cm, triple negative, 1/5 lymph node micrometastases, adjuvant radiation, 2 cycles of Taxol and carboplatin discontinued for sepsis Right breast cancer 07/2018: Status post lumpectomy 08/19/2018: Invasive papillary cancer 1.2 cm T1CN0 ER/PR positive HER-2 negative status post radiation completed 10/26/2018  Current treatment: Anastrozole started 11/15/2018 Anastrozole toxicities: Benign  Breast cancer surveillance: 1.Breast exam 04/01/2021: Benign 2.[facility]: Benign she has not had a mammogram this year.Marland Kitchen 3.Bone density 12/01/2018: T score -1.4: Osteopenia  Return to clinic in 1 year for follow-up

## 2021-07-18 DIAGNOSIS — G4733 Obstructive sleep apnea (adult) (pediatric): Secondary | ICD-10-CM | POA: Diagnosis not present

## 2021-07-26 DIAGNOSIS — M170 Bilateral primary osteoarthritis of knee: Secondary | ICD-10-CM | POA: Diagnosis not present

## 2021-07-26 DIAGNOSIS — M1712 Unilateral primary osteoarthritis, left knee: Secondary | ICD-10-CM | POA: Diagnosis not present

## 2021-07-26 DIAGNOSIS — Z96641 Presence of right artificial hip joint: Secondary | ICD-10-CM | POA: Diagnosis not present

## 2021-07-29 DIAGNOSIS — Z23 Encounter for immunization: Secondary | ICD-10-CM | POA: Diagnosis not present

## 2021-09-06 ENCOUNTER — Ambulatory Visit: Payer: Medicare PPO | Admitting: Cardiology

## 2021-09-06 DIAGNOSIS — M1712 Unilateral primary osteoarthritis, left knee: Secondary | ICD-10-CM | POA: Diagnosis not present

## 2021-09-10 ENCOUNTER — Encounter: Payer: Self-pay | Admitting: Cardiology

## 2021-09-10 ENCOUNTER — Ambulatory Visit: Payer: Medicare PPO | Admitting: Cardiology

## 2021-09-10 VITALS — BP 132/74 | HR 58 | Ht 68.0 in | Wt 203.4 lb

## 2021-09-10 DIAGNOSIS — I4821 Permanent atrial fibrillation: Secondary | ICD-10-CM | POA: Diagnosis not present

## 2021-09-10 DIAGNOSIS — I10 Essential (primary) hypertension: Secondary | ICD-10-CM | POA: Diagnosis not present

## 2021-09-10 DIAGNOSIS — Z79899 Other long term (current) drug therapy: Secondary | ICD-10-CM | POA: Diagnosis not present

## 2021-09-10 NOTE — Patient Instructions (Addendum)
Medication Instructions:  Your physician recommends that you continue on your current medications as directed. Please refer to the Current Medication list given to you today.  Labwork: BMET & CBC today or tomorrow at [facility] or [facility] in [place] No appointment needed Non-fasting  Testing/Procedures: none  Follow-Up: Your physician recommends that you schedule a follow-up appointment in: 6 months  Any Other Special Instructions Will Be Listed Below (If Applicable).  If you need a refill on your cardiac medications before your next appointment, please call your pharmacy.

## 2021-09-10 NOTE — Progress Notes (Signed)
Cardiology Office Note  Date: 09/10/2021   ID: Sheila Blair, DOB 04/22/1936, MRN [id number]  PCP:  Asencion Noble, MD  Cardiologist:  Rozann Lesches, MD Electrophysiologist:  None   Chief Complaint  Patient presents with   Cardiac follow-up    History of Present Illness: Sheila Blair is an 85 y.o. female last seen in May.  She is here for a routine visit.  She does not report any progressive sense of palpitations or shortness of breath beyond NYHA class II.  CHA2DS2-VASc score is 6.  She has remained on Pradaxa.  Did have trouble getting this filled over the weekend however, Dr. Willey Blade has sent in generic formulation prescription.  She does not report any bleeding problems.  We discussed getting lab work arranged.  Echocardiogram in June revealed LVEF 60 to 65% with mild LVH, normal RV contraction with mildly elevated RVSP, severe biatrial enlargement, and mild mitral and aortic regurgitation.  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   RIGHT UPPER ARM-CX3,5FU   Sleep apnea    CPAP    Type 2 diabetes mellitus ([place])     Past Surgical History:  Procedure Laterality Date   ABDOMINAL HYSTERECTOMY     ABDOMINAL SURGERY     APPENDECTOMY     benign thyroid tumor     BREAST LUMPECTOMY     BREAST SURGERY     CATARACT EXTRACTION     CHOLECYSTECTOMY     COLONOSCOPY N/A 07/10/2016   Procedure: COLONOSCOPY;  Surgeon: Rogene Houston, MD;  Location: [facility];  Service: Endoscopy;  Laterality: N/A;  930 - moved to 11/2 @ 9:30   EYE  SURGERY  2015   to correct drooping eye lids   HERNIA REPAIR     pseudoptosis Bilateral    rectocyle surgery     SMALL INTESTINE SURGERY     TOTAL HIP ARTHROPLASTY Right 12/05/2020   Procedure: TOTAL HIP ARTHROPLASTY ANTERIOR APPROACH;  Surgeon: Gaynelle Arabian, MD;  Location: [facility] ORS;  Service: Orthopedics;  Laterality: Right;  168min   VEIN LIGATION Right     Current Outpatient Medications  Medication Sig Dispense Refill   anastrozole (ARIMIDEX) 1 MG tablet TAKE 1 TABLET BY MOUTH DAILY. 90 tablet 0   atorvastatin (LIPITOR) 10 MG tablet Take 10 mg by mouth at bedtime.     Biotin 1000 MCG tablet Take 1,000 mcg by mouth daily.     calcium carbonate (OS-CAL - DOSED IN MG OF ELEMENTAL CALCIUM) 1250 (500 Ca) MG tablet Take 1 tablet by mouth daily with breakfast.     cholecalciferol (VITAMIN D3) 25 MCG (1000 UNIT) tablet Take 1,000 Units by mouth daily.     citalopram (CELEXA) 20 MG tablet Take 20 mg by mouth daily.     diltiazem (TIAZAC) 180 MG 24 hr capsule Take 180 mg by mouth at bedtime.     docusate sodium (COLACE) 100 MG capsule Take 100 mg by  mouth 2 (two) times daily.     hydrochlorothiazide (HYDRODIURIL) 25 MG tablet Take 25 mg by mouth daily.     HYDROcodone-acetaminophen (NORCO/VICODIN) 5-325 MG tablet Take 1-2 tablets by mouth every 6 (six) hours as needed for severe pain. 42 tablet 0   latanoprost (XALATAN) 0.005 % ophthalmic solution Place 1 drop into the right eye at bedtime.     methocarbamol (ROBAXIN) 500 MG tablet Take 1 tablet (500 mg total) by mouth every 6 (six) hours as needed for muscle spasms. 40 tablet 0   metoprolol tartrate (LOPRESSOR) 50 MG tablet Take 1 tablet (50 mg total) by mouth 2 (two) times daily. 180 tablet 3   Polyethyl Glycol-Propyl Glycol 0.4-0.3 % SOLN Place 1 drop into both eyes 2 (two) times daily as needed (dry eyes).     PRADAXA 150 MG CAPS capsule Take 150 mg by mouth 2 (two) times daily.     traMADol (ULTRAM) 50 MG tablet Take 50 mg by mouth 3 (three)  times daily as needed for severe pain.     No current facility-administered medications for this visit.   Allergies:  Codeine and Morphine and related   ROS: No syncope.  Physical Exam: VS:  BP 132/74   Pulse (!) 58   Ht 5\' 8"  (1.727 m)   Wt 203 lb 6.4 oz (92.3 kg)   SpO2 96%   BMI 30.93 kg/m , BMI Body mass index is 30.93 kg/m.  Wt Readings from Last 3 Encounters:  09/10/21 203 lb 6.4 oz (92.3 kg)  04/01/21 205 lb 14.4 oz (93.4 kg)  03/01/21 206 lb (93.4 kg)    General: Elderly woman, appears comfortable at rest. HEENT: Conjunctiva and lids normal, wearing a mask. Neck: Supple, no elevated JVP or carotid bruits, no thyromegaly. Lungs: Clear to auscultation, nonlabored breathing at rest. Cardiac: Irregularly irregular, no S3, 1/6 systolic murmur. Extremities: No pitting edema.  ECG:  An ECG dated 12/07/2020 was personally reviewed today and demonstrated:  Atrial fibrillation.  Recent Labwork: 11/26/2020: ALT 28; AST 30 12/06/2020: BUN 14; Creatinine, Ser 0.70; Hemoglobin 12.0; Platelets 170; Potassium 3.4; Sodium 136   Other Studies Reviewed Today:  Echocardiogram 03/26/2021:  1. Left ventricular ejection fraction, by estimation, is 60 to 65%. The  left ventricle has normal function. The left ventricle has no regional  wall motion abnormalities. There is mild left ventricular hypertrophy.  Left ventricular diastolic parameters  are indeterminate.   2. Right ventricular systolic function is normal. The right ventricular  size is normal. There is mildly elevated pulmonary artery systolic  pressure.   3. Left atrial size was severely dilated.   4. Right atrial size was severely dilated.   5. The mitral valve is normal in structure. Mild mitral valve  regurgitation. No evidence of mitral stenosis.   6. The aortic valve is tricuspid. Aortic valve regurgitation is mild. No  aortic stenosis is present.   Assessment and Plan:  1.  Permanent atrial fibrillation with  CHA2DS2-VASc score of 6.  She is doing well with heart rate control strategy, currently on Tiazac and Lopressor.  She does not report any sense of palpitations.  She remains on Pradaxa, has had some trouble getting it filled recently.  Would suggest considering a switch to Eliquis if this remains an issue.  We will obtain a follow-up CBC and BMET.  2.  Essential hypertension, systolic is in the 376E today.  No changes to current regimen including HCTZ.  Medication Adjustments/Labs and Tests Ordered: Current  medicines are reviewed at length with the patient today.  Concerns regarding medicines are outlined above.   Tests Ordered: Orders Placed This Encounter  Procedures   CBC   Basic metabolic panel     Medication Changes: No orders of the defined types were placed in this encounter.   Disposition:  Follow up  6 months.  Signed, Satira Sark, MD, [facility] 09/10/2021 9:27 AM    [facility] [place], [place], [place] [address] Phone: [phone]; Fax: [phone]

## 2021-09-11 ENCOUNTER — Other Ambulatory Visit: Payer: Self-pay | Admitting: Cardiology

## 2021-09-11 DIAGNOSIS — I4821 Permanent atrial fibrillation: Secondary | ICD-10-CM | POA: Diagnosis not present

## 2021-09-11 DIAGNOSIS — Z79899 Other long term (current) drug therapy: Secondary | ICD-10-CM | POA: Diagnosis not present

## 2021-09-12 LAB — CBC
Hematocrit: 42.6 % (ref 34.0–46.6)
Hemoglobin: 14.7 g/dL (ref 11.1–15.9)
MCH: 31.5 pg (ref 26.6–33.0)
MCHC: 34.5 g/dL (ref 31.5–35.7)
MCV: 91 fL (ref 79–97)
Platelets: 228 10*3/uL (ref 150–450)
RBC: 4.67 x10E6/uL (ref 3.77–5.28)
RDW: 12.8 % (ref 11.7–15.4)
WBC: 5.8 10*3/uL (ref 3.4–10.8)

## 2021-09-12 LAB — BASIC METABOLIC PANEL
BUN/Creatinine Ratio: 16 (ref 12–28)
BUN: 15 mg/dL (ref 8–27)
CO2: 24 mmol/L (ref 20–29)
Calcium: 9.2 mg/dL (ref 8.7–10.3)
Chloride: 99 mmol/L (ref 96–106)
Creatinine, Ser: 0.96 mg/dL (ref 0.57–1.00)
Glucose: 108 mg/dL — ABNORMAL HIGH (ref 70–99)
Potassium: 3.9 mmol/L (ref 3.5–5.2)
Sodium: 143 mmol/L (ref 134–144)
eGFR: 59 mL/min/{1.73_m2} — ABNORMAL LOW (ref >59)

## 2021-09-13 ENCOUNTER — Telehealth: Payer: Self-pay | Admitting: *Deleted

## 2021-09-13 DIAGNOSIS — M1712 Unilateral primary osteoarthritis, left knee: Secondary | ICD-10-CM | POA: Diagnosis not present

## 2021-09-13 NOTE — Telephone Encounter (Signed)
-----   Message from Satira Sark, MD sent at 09/12/2021  4:40 PM EST ----- Results reviewed.  Hemoglobin and renal function are stable.  Continue with current plan.

## 2021-09-16 DIAGNOSIS — H401112 Primary open-angle glaucoma, right eye, moderate stage: Secondary | ICD-10-CM | POA: Diagnosis not present

## 2021-09-16 DIAGNOSIS — Z961 Presence of intraocular lens: Secondary | ICD-10-CM | POA: Diagnosis not present

## 2021-09-19 NOTE — Telephone Encounter (Signed)
Patient informed. Copy sent to PCP °

## 2021-09-20 DIAGNOSIS — M13862 Other specified arthritis, left knee: Secondary | ICD-10-CM | POA: Diagnosis not present

## 2021-09-20 DIAGNOSIS — M1712 Unilateral primary osteoarthritis, left knee: Secondary | ICD-10-CM | POA: Diagnosis not present

## 2021-10-24 ENCOUNTER — Telehealth: Payer: Self-pay | Admitting: Cardiology

## 2021-10-24 DIAGNOSIS — N390 Urinary tract infection, site not specified: Secondary | ICD-10-CM | POA: Diagnosis not present

## 2021-10-24 NOTE — Telephone Encounter (Signed)
°  Are you calling in reference to your FMLA or disability form? no   What is your question in regards to FMLA or disability form? no   Do you need copies of your medical records? yes   Are you waiting on a nurse to call you back with results or are you wanting copies of your results? no     Please route to Medical Records or your medical records site representative

## 2021-11-06 ENCOUNTER — Other Ambulatory Visit: Payer: Self-pay | Admitting: Hematology and Oncology

## 2021-11-12 ENCOUNTER — Ambulatory Visit: Payer: Medicare PPO | Admitting: Dermatology

## 2021-11-12 ENCOUNTER — Other Ambulatory Visit: Payer: Self-pay

## 2021-11-12 ENCOUNTER — Encounter: Payer: Self-pay | Admitting: Dermatology

## 2021-11-12 DIAGNOSIS — M7130 Other bursal cyst, unspecified site: Secondary | ICD-10-CM | POA: Diagnosis not present

## 2021-11-12 DIAGNOSIS — Z1283 Encounter for screening for malignant neoplasm of skin: Secondary | ICD-10-CM

## 2021-11-12 DIAGNOSIS — D0461 Carcinoma in situ of skin of right upper limb, including shoulder: Secondary | ICD-10-CM | POA: Diagnosis not present

## 2021-11-12 DIAGNOSIS — B079 Viral wart, unspecified: Secondary | ICD-10-CM

## 2021-11-12 DIAGNOSIS — D485 Neoplasm of uncertain behavior of skin: Secondary | ICD-10-CM

## 2021-11-12 DIAGNOSIS — L821 Other seborrheic keratosis: Secondary | ICD-10-CM

## 2021-11-18 ENCOUNTER — Encounter: Payer: Self-pay | Admitting: *Deleted

## 2021-11-18 ENCOUNTER — Telehealth: Payer: Self-pay | Admitting: *Deleted

## 2021-11-18 NOTE — Telephone Encounter (Signed)
-----   Message from Lavonna Monarch, MD sent at 11/14/2021  9:18 PM EST ----- Schedule surgery with Dr. Darene Lamer

## 2021-11-18 NOTE — Telephone Encounter (Signed)
Path to patient. Made a surgery appointment with Dr.Tafeen to have that lesion treated.

## 2021-11-19 DIAGNOSIS — M79641 Pain in right hand: Secondary | ICD-10-CM | POA: Diagnosis not present

## 2021-11-26 DIAGNOSIS — L089 Local infection of the skin and subcutaneous tissue, unspecified: Secondary | ICD-10-CM | POA: Diagnosis not present

## 2021-12-01 ENCOUNTER — Encounter: Payer: Self-pay | Admitting: Dermatology

## 2021-12-01 NOTE — Progress Notes (Signed)
° °  Follow-Up Visit   Subjective  Sheila Blair is a 86 y.o. female who presents for the following: Annual Exam (Lesion on both arms x years. Lesion on chest x week. Lesion on right lower leg x today. Personal history of scc and atypical moles. ).  Normal skin examination, several areas of concern including growth on her Location:  Duration:  Quality:  Associated Signs/Symptoms: Modifying Factors:  Severity:  Timing: Context:   Objective  Well appearing patient in no apparent distress; mood and affect are within normal limits. Scalp Back plus also exposed areas examined, no atypical pigmented lesions.  1 possible nonmelanoma skin cancer arm will be biopsied.  Left Instep Verrucous papule compatible with wart versus irritated keratosis  Right Upper Arm - Posterior Waxy 6 mm crust, rule out superficial carcinoma       Chest - Medial (Center) Flattopped brown 4 mm textured papule  Right Distal 3rd Finger Subtle swelling tender and erythema proximal nail fold with loss of cuticle.  No typical vesicular papule.       All sun exposed areas plus back examined.   Assessment & Plan    Encounter for screening for malignant neoplasm of skin Scalp  Viral warts, unspecified type Left Instep  No intervention unless there is clinical change  Neoplasm of uncertain behavior of skin Right Upper Arm - Posterior  Skin / nail biopsy Type of biopsy: tangential   Informed consent: discussed and consent obtained   Timeout: patient name, date of birth, surgical site, and procedure verified   Anesthesia: the lesion was anesthetized in a standard fashion   Anesthetic:  1% lidocaine w/ epinephrine 1-100,000 local infiltration Instrument used: flexible razor blade   Hemostasis achieved with: aluminum chloride and electrodesiccation   Outcome: patient tolerated procedure well   Post-procedure details: wound care instructions given    Specimen 1 - Surgical  pathology Differential Diagnosis: bcc vs scc  Check Margins: No  Seborrheic keratosis Chest - Medial (Center)  Leave if stable  Myxoid cyst Right Distal 3rd Finger  Continue neosporin. Hand surgeon if needed      I, Lavonna Monarch, MD, have reviewed all documentation for this visit.  The documentation on 12/01/21 for the exam, diagnosis, procedures, and orders are all accurate and complete.

## 2021-12-03 DIAGNOSIS — M1712 Unilateral primary osteoarthritis, left knee: Secondary | ICD-10-CM | POA: Diagnosis not present

## 2021-12-19 ENCOUNTER — Ambulatory Visit (INDEPENDENT_AMBULATORY_CARE_PROVIDER_SITE_OTHER): Payer: Medicare PPO | Admitting: Dermatology

## 2021-12-19 ENCOUNTER — Other Ambulatory Visit: Payer: Self-pay

## 2021-12-19 ENCOUNTER — Other Ambulatory Visit: Payer: Self-pay | Admitting: Dermatology

## 2021-12-19 ENCOUNTER — Encounter: Payer: Self-pay | Admitting: Dermatology

## 2021-12-19 DIAGNOSIS — D0461 Carcinoma in situ of skin of right upper limb, including shoulder: Secondary | ICD-10-CM

## 2021-12-19 NOTE — Patient Instructions (Signed)

## 2022-01-01 ENCOUNTER — Encounter: Payer: Self-pay | Admitting: Dermatology

## 2022-01-01 NOTE — Progress Notes (Signed)
? ?Follow-Up Visit ?  ?Subjective  ?Sheila Blair is a 86 y.o. female who presents for the following: Procedure (Patient here today for treatment of CIS x 1 right upper arm-posterior). ? ?Biopsy-proven CIS right arm plus second crusted area nearby ?Location:  ?Duration:  ?Quality:  ?Associated Signs/Symptoms: ?Modifying Factors:  ?Severity:  ?Timing: ?Context:  ? ?Objective  ?Well appearing patient in no apparent distress; mood and affect are within normal limits. ?Right Upper Arm - Anterior ?Lesion identified by Dr.Winni Ehrhard and nurse in room.   ? ?Right Shoulder - Anterior ?Waxy 1cm pink crust, superficial carcinoma ? ? ? ? ? ? ? ? ?A focused examination was performed including head, neck, arms. Relevant physical exam findings are noted in the Assessment and Plan. ? ? ?Assessment & Plan  ? ? 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 ?Destruction of lesion ?Complexity: simple   ?Destruction method: electrodesiccation and curettage   ?Informed consent: discussed and consent obtained   ?Timeout:  patient name, date of birth, surgical site, and procedure verified ?Anesthesia: the lesion was anesthetized in a standard fashion   ?Anesthetic:  1% lidocaine w/ epinephrine 1-100,000 local infiltration ?Curettage performed in three different directions: Yes   ?Electrodesiccation performed over the curetted area: Yes   ?Curettage cycles:  3 ?Lesion length (cm):  1.5 ?Lesion width (cm):  1.5 ?Margin per side (cm):  0 ?Final wound size (cm):  1.5 ?Hemostasis achieved with:  ferric subsulfate ?Outcome: patient tolerated procedure well with no complications   ?Post-procedure details: sterile dressing applied and wound care instructions given   ?Dressing type: bandage and petrolatum   ?Additional details:  Wound inoculated with 5% Fluorouracil.   ? 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 ?Skin / nail biopsy ?Type of biopsy:  tangential   ?Informed consent: discussed and consent obtained   ?Timeout: patient name, date of birth, surgical site, and procedure verified   ?Anesthesia: the lesion was anesthetized in a standard fashion   ?Anesthetic:  1% lidocaine w/ epinephrine 1-100,000 local infiltration ?Instrument used: flexible razor blade   ?Hemostasis achieved with: ferric subsulfate and electrodesiccation   ?Outcome: patient tolerated procedure well   ?Post-procedure details: wound care instructions given   ? ?Destruction of lesion ?Complexity: simple   ?Destruction method: electrodesiccation and curettage   ?Informed consent: discussed and consent obtained   ?Timeout:  patient name, date of birth, surgical site, and procedure verified ?Anesthesia: the lesion was anesthetized in a standard fashion   ?Anesthetic:  1% lidocaine w/ epinephrine 1-100,000 local infiltration ?Curettage performed in three different directions: Yes   ?  Electrodesiccation performed over the curetted area: No   ?Curettage cycles:  3 ?Lesion length (cm):  1.4 ?Lesion width (cm):  1.4 ?Margin per side (cm):  0 ?Final wound size (cm):  1.4 ?Hemostasis achieved with:  ferric subsulfate ?Outcome: patient tolerated procedure well with no complications   ?Post-procedure details: sterile dressing applied and wound care instructions given   ?Dressing type: bandage and petrolatum   ?Additional details:  Wound inoculated with 5% Fluorouracil.   ? ?Specimen 1 - Surgical pathology ?Differential Diagnosis: R/O BCC VS SCC ? ?Check Margins: No ? ?After shave biopsy the base was treated with curettage plus cautery ? ? ? ? ? ?I, Lavonna Monarch, MD, have reviewed all documentation for this visit.  The documentation on 01/01/22 for the exam, diagnosis, procedures, and  orders are all accurate and complete. ?

## 2022-01-16 DIAGNOSIS — H401112 Primary open-angle glaucoma, right eye, moderate stage: Secondary | ICD-10-CM | POA: Diagnosis not present

## 2022-01-30 ENCOUNTER — Telehealth: Payer: Self-pay | Admitting: Cardiology

## 2022-01-30 ENCOUNTER — Ambulatory Visit (INDEPENDENT_AMBULATORY_CARE_PROVIDER_SITE_OTHER): Payer: Medicare PPO

## 2022-01-30 DIAGNOSIS — R001 Bradycardia, unspecified: Secondary | ICD-10-CM | POA: Diagnosis not present

## 2022-01-30 NOTE — Telephone Encounter (Signed)
Pt agrees to come in office today for EKG.  ?

## 2022-01-30 NOTE — Telephone Encounter (Signed)
Spoke with pt who states that 2 weeks ago when she went to bed she felt sick, nausea. States that she was unable to use her CPAP machine. She feels she had an anxiety attack. She has been able to use CPAP. Current BP is 116/63 HR 45. Denies being SOB,chest pain,or nausea on today. Pt states that over the last 2 weeks HR has been in the 40's and 50's. Please advise.  ?

## 2022-01-30 NOTE — Telephone Encounter (Signed)
Pt c/o BP issue: STAT if pt c/o blurred vision, one-sided weakness or slurred speech ? ?1. What are your last 5 BP readings? This morning 116/63 HR 45 hour ago, 160-170/100's ? ?2. Are you having any other symptoms (ex. Dizziness, headache, blurred vision, passed out)? Fatigue, headaches, nausea  ? ?3. What is your BP issue? Patient states for about 2 weeks she has noticed her BP going very high and now it is very low. She as been working in garden, but is not very active and has never been this fatigued.  ? ?

## 2022-02-08 ENCOUNTER — Other Ambulatory Visit: Payer: Self-pay | Admitting: Hematology and Oncology

## 2022-02-11 DIAGNOSIS — M1612 Unilateral primary osteoarthritis, left hip: Secondary | ICD-10-CM | POA: Diagnosis not present

## 2022-02-18 DIAGNOSIS — M1712 Unilateral primary osteoarthritis, left knee: Secondary | ICD-10-CM | POA: Diagnosis not present

## 2022-02-18 DIAGNOSIS — M1612 Unilateral primary osteoarthritis, left hip: Secondary | ICD-10-CM | POA: Diagnosis not present

## 2022-02-24 ENCOUNTER — Ambulatory Visit: Payer: Medicare PPO | Admitting: Cardiology

## 2022-02-24 ENCOUNTER — Encounter: Payer: Self-pay | Admitting: Cardiology

## 2022-02-24 VITALS — BP 130/80 | HR 83 | Ht 68.0 in | Wt 201.2 lb

## 2022-02-24 DIAGNOSIS — I4821 Permanent atrial fibrillation: Secondary | ICD-10-CM

## 2022-02-24 DIAGNOSIS — I10 Essential (primary) hypertension: Secondary | ICD-10-CM

## 2022-02-24 MED ORDER — METOPROLOL SUCCINATE ER 50 MG PO TB24: 50.0000 mg | ORAL_TABLET | Freq: Every day | ORAL | 3 refills | Status: DC

## 2022-02-24 NOTE — Progress Notes (Signed)
Cardiology Office Note  Date: 02/24/2022   ID: Sheila Blair, DOB [birth year], MRN [id number]  PCP:  Asencion Noble, MD  Cardiologist:  Rozann Lesches, MD Electrophysiologist:  None   Chief Complaint  Patient presents with   Cardiac follow-up    History of Present Illness: Sheila Blair is an 86 y.o. female last seen in December 2022.  She is here for a follow-up visit.  Reports no sense of palpitations or chest pain with activity, NYHA class II dyspnea.  We went over her medications and discussed switching from Lopressor to Toprol-XL, she had been taking it only once a day.  Also on Cardizem CD 180 mg daily.  This was listed twice on her medication list.  She reports no spontaneous bleeding problems on generic Pradaxa.  I reviewed her last lab work.  Past Medical History:  Diagnosis Date   Anxiety    Arthritis    Atrial fibrillation ([place])    Atypical nevus 09/21/2013   Mild-Left tricep   Breast cancer ([place])    BILATERAL    Difficult intubation    PT REPORTS NARROW AIRWAY BUT  HAS HAD 17 SURGERIES WITHOUT A PROBLEM - SURGERIES AT [facility] AND AT [facility] PER PT    Essential hypertension    Hypothyroidism    SCC (squamous cell carcinoma) 12/19/2019   right lower leg anterior-txpbx   SCC (squamous cell carcinoma) 11/12/2021   right arm   SCCA (squamous cell carcinoma) of skin 04/06/2017   RIGHT UPPER ARM-CX3,5FU   Sleep apnea    CPAP    Type 2 diabetes mellitus ([place])     Past Surgical History:  Procedure Laterality Date   ABDOMINAL HYSTERECTOMY     ABDOMINAL SURGERY     APPENDECTOMY     benign thyroid tumor     BREAST LUMPECTOMY     BREAST SURGERY     CATARACT EXTRACTION     CHOLECYSTECTOMY     COLONOSCOPY N/A 07/10/2016   Procedure: COLONOSCOPY;  Surgeon: Rogene Houston, MD;  Location: [facility];  Service: Endoscopy;  Laterality: N/A;  930 - moved to 11/2 @ 9:30   EYE SURGERY  2015   to correct drooping eye lids   HERNIA REPAIR     pseudoptosis Bilateral     rectocyle surgery     SMALL INTESTINE SURGERY     TOTAL HIP ARTHROPLASTY Right 12/05/2020   Procedure: TOTAL HIP ARTHROPLASTY ANTERIOR APPROACH;  Surgeon: Gaynelle Arabian, MD;  Location: [facility];  Service: Orthopedics;  Laterality: Right;  163mn   VEIN LIGATION Right     Current Outpatient Medications  Medication Sig Dispense Refill   anastrozole (ARIMIDEX) 1 MG tablet TAKE 1 TABLET BY MOUTH DAILY. 90 tablet 3   atorvastatin (LIPITOR) 10 MG tablet Take 10 mg by mouth at bedtime.     Biotin 1000 MCG tablet Take 1,000 mcg by mouth daily.     calcium carbonate (OS-CAL - DOSED IN MG OF ELEMENTAL CALCIUM) 1250 (500 Ca) MG tablet Take 1 tablet by mouth daily with breakfast.     cholecalciferol (VITAMIN D3) 25 MCG (1000 UNIT) tablet Take 1,000 Units by mouth daily.     citalopram (CELEXA) 20 MG tablet Take 20 mg by mouth daily.     diltiazem (CARDIZEM CD) 180 MG 24 hr capsule Take 180 mg by mouth daily.     diltiazem (TIAZAC) 180 MG 24 hr capsule Take 180 mg by mouth at bedtime.  docusate sodium (COLACE) 100 MG capsule Take 100 mg by mouth 2 (two) times daily.     hydrochlorothiazide (MICROZIDE) 12.5 MG capsule Take 12.5 mg by mouth daily.     latanoprost (XALATAN) 0.005 % ophthalmic solution Place 1 drop into the right eye at bedtime.     metoprolol succinate (TOPROL-XL) 50 MG 24 hr tablet Take 1 tablet (50 mg total) by mouth daily. Take with or immediately following a meal. 90 tablet 3   Polyethyl Glycol-Propyl Glycol 0.4-0.3 % SOLN Place 1 drop into both eyes 2 (two) times daily as needed (dry eyes).     PRADAXA 150 MG CAPS capsule Take 150 mg by mouth 2 (two) times daily.     No current facility-administered medications for this visit.   Allergies:  Codeine and Morphine and related   ROS: Bursitis pain.  Physical Exam: VS:  BP 130/80   Pulse 83   Ht '5\' 8"'$  (1.727 m)   Wt 201 lb 3.2 oz (91.3 kg)   SpO2 98%   BMI 30.59 kg/m , BMI Body mass index is 30.59 kg/m.  Wt Readings from Last  3 Encounters:  02/24/22 201 lb 3.2 oz (91.3 kg)  09/10/21 203 lb 6.4 oz (92.3 kg)  04/01/21 205 lb 14.4 oz (93.4 kg)    General: Patient appears comfortable at rest. HEENT: Conjunctiva and lids normal. Neck: Supple, no elevated JVP or carotid bruits, no thyromegaly. Lungs: Clear to auscultation, nonlabored breathing at rest. Cardiac: Irregularly irregular, no S3, 1/6 systolic murmur. Extremities: No pitting edema.  ECG:  An ECG dated 01/30/2022 was personally reviewed today and demonstrated:  Atrial fibrillation at 56 bpm, nonspecific T wave changes.  Recent Labwork: 09/11/2021: BUN 15; Creatinine, Ser 0.96; Hemoglobin 14.7; Platelets 228; Potassium 3.9; Sodium 143   Other Studies Reviewed Today:  Echocardiogram 03/26/2021:  1. Left ventricular ejection fraction, by estimation, is 60 to 65%. The  left ventricle has normal function. The left ventricle has no regional  wall motion abnormalities. There is mild left ventricular hypertrophy.  Left ventricular diastolic parameters  are indeterminate.   2. Right ventricular systolic function is normal. The right ventricular  size is normal. There is mildly elevated pulmonary artery systolic  pressure.   3. Left atrial size was severely dilated.   4. Right atrial size was severely dilated.   5. The mitral valve is normal in structure. Mild mitral valve  regurgitation. No evidence of mitral stenosis.   6. The aortic valve is tricuspid. Aortic valve regurgitation is mild. No  aortic stenosis is present.   Assessment and Plan:  1.  Permanent atrial fibrillation with CHA2DS2-VASc score of 6.  She continues on Pradaxa for stroke prophylaxis.  I reviewed her last lab work.  Continue Cardizem CD 100 mg daily, and switch from Lopressor to Toprol-XL 50 mg daily.  2.  Essential hypertension, no changes made to current regimen other than switch from Lopressor to Toprol-XL.  Medication Adjustments/Labs and Tests Ordered: Current medicines are  reviewed at length with the patient today.  Concerns regarding medicines are outlined above.   Tests Ordered: No orders of the defined types were placed in this encounter.   Medication Changes: Meds ordered this encounter  Medications   metoprolol succinate (TOPROL-XL) 50 MG 24 hr tablet    Sig: Take 1 tablet (50 mg total) by mouth daily. Take with or immediately following a meal.    Dispense:  90 tablet    Refill:  3    Replaces  lopressor    Disposition:  Follow up  6 months  Signed, Satira Sark, MD, [facility] 02/24/2022 11:38 AM    [facility] [place]. [address], [place], [place] [address] Phone: [phone]; Fax: [phone]

## 2022-02-24 NOTE — Patient Instructions (Addendum)
Medication Instructions:  STOP Lopressor   START Toprol XL 50 mg every morning  Labwork: None today  Testing/Procedures: None today  Follow-Up: 6 months  Any Other Special Instructions Will Be Listed Below (If Applicable).  If you need a refill on your cardiac medications before your next appointment, please call your pharmacy.

## 2022-02-26 ENCOUNTER — Other Ambulatory Visit: Payer: Self-pay | Admitting: Hematology and Oncology

## 2022-02-26 DIAGNOSIS — Z1231 Encounter for screening mammogram for malignant neoplasm of breast: Secondary | ICD-10-CM

## 2022-02-27 DIAGNOSIS — H401132 Primary open-angle glaucoma, bilateral, moderate stage: Secondary | ICD-10-CM | POA: Diagnosis not present

## 2022-03-12 DIAGNOSIS — M1712 Unilateral primary osteoarthritis, left knee: Secondary | ICD-10-CM | POA: Diagnosis not present

## 2022-03-13 ENCOUNTER — Telehealth (HOSPITAL_COMMUNITY): Payer: Self-pay | Admitting: Physical Therapy

## 2022-03-13 NOTE — Telephone Encounter (Signed)
Patient called to cx and close referral - MD said she did not need this PT at this time.

## 2022-03-19 ENCOUNTER — Ambulatory Visit (HOSPITAL_COMMUNITY): Payer: Medicare PPO | Admitting: Physical Therapy

## 2022-03-19 DIAGNOSIS — M1712 Unilateral primary osteoarthritis, left knee: Secondary | ICD-10-CM | POA: Diagnosis not present

## 2022-03-25 DIAGNOSIS — M1712 Unilateral primary osteoarthritis, left knee: Secondary | ICD-10-CM | POA: Diagnosis not present

## 2022-03-26 ENCOUNTER — Ambulatory Visit
Admission: RE | Admit: 2022-03-26 | Discharge: 2022-03-26 | Disposition: A | Discharge status: Home / Self Care | Payer: Medicare PPO | Source: Ambulatory Visit | Attending: Hematology and Oncology | Admitting: Hematology and Oncology

## 2022-03-26 ENCOUNTER — Inpatient Hospital Stay: Payer: Medicare PPO | Admitting: Hematology and Oncology

## 2022-03-26 DIAGNOSIS — Z1231 Encounter for screening mammogram for malignant neoplasm of breast: Secondary | ICD-10-CM

## 2022-03-31 ENCOUNTER — Telehealth: Payer: Self-pay | Admitting: Dermatology

## 2022-03-31 ENCOUNTER — Encounter: Payer: Self-pay | Admitting: Dermatology

## 2022-03-31 ENCOUNTER — Ambulatory Visit: Payer: Medicare PPO | Admitting: Dermatology

## 2022-03-31 ENCOUNTER — Telehealth: Payer: Self-pay

## 2022-03-31 DIAGNOSIS — L570 Actinic keratosis: Secondary | ICD-10-CM

## 2022-03-31 DIAGNOSIS — L719 Rosacea, unspecified: Secondary | ICD-10-CM | POA: Diagnosis not present

## 2022-03-31 MED ORDER — IVERMECTIN 1 % EX CREA: TOPICAL_CREAM | CUTANEOUS | 1 refills | Status: DC

## 2022-03-31 NOTE — Telephone Encounter (Signed)
Patient left message on office voice mail that insurance is not covering Ivermectin and her out of pocket cost is $245.00.  Patient states that she cannot afford that and wants to know what she can do.

## 2022-04-01 ENCOUNTER — Telehealth: Payer: Self-pay | Admitting: Dermatology

## 2022-04-01 MED ORDER — METRONIDAZOLE 0.75 % EX GEL: 1.0000 | Freq: Every evening | CUTANEOUS | 9 refills | Status: DC

## 2022-04-01 NOTE — Assessment & Plan Note (Addendum)
History of bilateral breast cancers Left breast cancer 2009: Grade 2 IDC 1.3 cm, triple negative, 1/5 lymph node micrometastases, adjuvant radiation, 2 cycles of Taxol and carboplatin discontinued for sepsis Right breast cancer 07/2018: Status post lumpectomy 08/19/2018: Invasive papillary cancer 1.2 cm T1CN0 ER/PR positive HER-2 negative status post radiation completed 10/26/2018  Current treatment: Anastrozole started 11/15/2018 Anastrozole toxicities: Benign  Breast cancer surveillance: 1.Breast exam 04/01/2022: Benign 2.Mammogramdone in April 2021 at [facility]: Benign she has not had a mammogram this year.Marland Kitchen 3.Bone density 12/01/2018: T score -1.4: Osteopenia  We will request genetic counseling appointment for the patient. Return to clinic in 1 year for follow-up

## 2022-04-02 ENCOUNTER — Other Ambulatory Visit: Payer: Self-pay

## 2022-04-02 ENCOUNTER — Inpatient Hospital Stay: Payer: Medicare PPO | Attending: Hematology and Oncology | Admitting: Hematology and Oncology

## 2022-04-02 VITALS — BP 189/77 | HR 85 | Temp 97.2°F | Resp 17 | Ht 68.0 in | Wt 203.1 lb

## 2022-04-02 DIAGNOSIS — Z923 Personal history of irradiation: Secondary | ICD-10-CM | POA: Insufficient documentation

## 2022-04-02 DIAGNOSIS — Z170 Estrogen receptor positive status [ER+]: Secondary | ICD-10-CM | POA: Diagnosis not present

## 2022-04-02 DIAGNOSIS — Z853 Personal history of malignant neoplasm of breast: Secondary | ICD-10-CM | POA: Diagnosis not present

## 2022-04-02 DIAGNOSIS — C50912 Malignant neoplasm of unspecified site of left female breast: Secondary | ICD-10-CM

## 2022-04-02 DIAGNOSIS — C50911 Malignant neoplasm of unspecified site of right female breast: Secondary | ICD-10-CM | POA: Diagnosis not present

## 2022-04-02 DIAGNOSIS — M8580 Other specified disorders of bone density and structure, unspecified site: Secondary | ICD-10-CM | POA: Insufficient documentation

## 2022-04-02 DIAGNOSIS — Z79811 Long term (current) use of aromatase inhibitors: Secondary | ICD-10-CM | POA: Diagnosis not present

## 2022-04-02 NOTE — Progress Notes (Signed)
Patient Care Team: Asencion Noble, MD as PCP - General (Internal Medicine) Satira Sark, MD as PCP - Cardiology (Cardiology) Lavonna Monarch, MD as Consulting Physician (Dermatology)  DIAGNOSIS:  Encounter Diagnosis  Name Primary?   Bilateral malignant neoplasm of breast in female, estrogen receptor positive, unspecified site of breast ([place]) Yes    SUMMARY OF ONCOLOGIC HISTORY: Oncology History  Bilateral breast cancer ([place])  2009 Initial Diagnosis   Left breast cancer 2009: Grade 2 IDC 1.3 cm, triple negative, 1/5 lymph node micrometastases, adjuvant radiation, 2 cycles of Taxol and carboplatin discontinued for sepsis   2019 Relapse/Recurrence   Right breast cancer 07/2018: Status post lumpectomy 08/19/2018: Invasive papillary cancer 1.2 cm T1CN0 ER/PR positive HER-2 negative status post radiation completed 10/26/2018 [facility])   11/15/2018 -  Anti-estrogen oral therapy   Anastrozole     CHIEF COMPLIANT: Follow-up on anastrozole  INTERVAL HISTORY: Sheila Blair is a 86 y.o with the above mentioned history of breast cancer currently on anastrozole therapy.  She presents to the clinic today for a follow-up. Denies pain and discomfort in breast. States that she had a hip replacement. But overall she tolerated the anastrozole.  She has been tolerating anastrozole without any problems or concerns.  She denies any pain lumps or nodules in the breast.   ALLERGIES:  is allergic to codeine and morphine and related.  MEDICATIONS:  Current Outpatient Medications  Medication Sig Dispense Refill   anastrozole (ARIMIDEX) 1 MG tablet TAKE 1 TABLET BY MOUTH DAILY. 90 tablet 3   atorvastatin (LIPITOR) 10 MG tablet Take 10 mg by mouth at bedtime.     Biotin 1000 MCG tablet Take 1,000 mcg by mouth daily.     calcium carbonate (OS-CAL - DOSED IN MG OF ELEMENTAL CALCIUM) 1250 (500 Ca) MG tablet Take 1 tablet by mouth daily with breakfast.     cholecalciferol (VITAMIN D3) 25 MCG (1000  UNIT) tablet Take 1,000 Units by mouth daily.     citalopram (CELEXA) 20 MG tablet Take 20 mg by mouth daily.     diltiazem (CARDIZEM CD) 180 MG 24 hr capsule Take 180 mg by mouth daily.     diltiazem (TIAZAC) 180 MG 24 hr capsule Take 180 mg by mouth at bedtime.     docusate sodium (COLACE) 100 MG capsule Take 100 mg by mouth 2 (two) times daily.     hydrochlorothiazide (MICROZIDE) 12.5 MG capsule Take 12.5 mg by mouth daily.     Ivermectin 1 % CREA Apply to face daily for rosacea 30 g 1   latanoprost (XALATAN) 0.005 % ophthalmic solution Place 1 drop into the right eye at bedtime.     metoprolol succinate (TOPROL-XL) 50 MG 24 hr tablet Take 1 tablet (50 mg total) by mouth daily. Take with or immediately following a meal. 90 tablet 3   metroNIDAZOLE (METROGEL) 0.75 % gel Apply 1 Application topically at bedtime. 45 g 9   Polyethyl Glycol-Propyl Glycol 0.4-0.3 % SOLN Place 1 drop into both eyes 2 (two) times daily as needed (dry eyes).     PRADAXA 150 MG CAPS capsule Take 150 mg by mouth 2 (two) times daily.     No current facility-administered medications for this visit.    PHYSICAL EXAMINATION: ECOG PERFORMANCE STATUS: 1 - Symptomatic but completely ambulatory  Vitals:   04/02/22 1205  BP: (!) 189/77  Pulse: 85  Resp: 17  Temp: (!) 97.2 F (36.2 C)  SpO2: 94%   Filed Weights  04/02/22 1205  Weight: 203 lb 1.6 oz (92.1 kg)    BREAST: No palpable masses or nodules in either right or left breasts.  Scar tissue from prior breast surgery.  No palpable axillary supraclavicular or infraclavicular adenopathy no breast tenderness or nipple discharge. (exam performed in the presence of a chaperone)  LABORATORY DATA:  I have reviewed the data as listed    Latest Ref Rng & Units 09/11/2021   11:13 AM 12/06/2020    3:07 AM 11/26/2020   11:43 AM  CMP  Glucose 70 - 99 mg/dL 108  209  119   BUN 8 - 27 mg/dL '15  14  14   ' Creatinine 0.57 - 1.00 mg/dL 0.96  0.70  0.85   Sodium 134 - 144  mmol/L 143  136  138   Potassium 3.5 - 5.2 mmol/L 3.9  3.4  3.9   Chloride 96 - 106 mmol/L 99  100  98   CO2 20 - 29 mmol/L '24  31  28   ' Calcium 8.7 - 10.3 mg/dL 9.2  8.7  9.4   Total Protein 6.5 - 8.1 g/dL   7.1   Total Bilirubin 0.3 - 1.2 mg/dL   1.3   Alkaline Phos 38 - 126 U/L   76   AST 15 - 41 U/L   30   ALT 0 - 44 U/L   28     Lab Results  Component Value Date   WBC 5.8 09/11/2021   HGB 14.7 09/11/2021   HCT 42.6 09/11/2021   MCV 91 09/11/2021   PLT 228 09/11/2021   NEUTROABS 7.1 07/17/2013    ASSESSMENT & PLAN:  Bilateral breast cancer ([place]) History of bilateral breast cancers Left breast cancer 2009: Grade 2 IDC 1.3 cm, triple negative, 1/5 lymph node micrometastases, adjuvant radiation, 2 cycles of Taxol and carboplatin discontinued for sepsis Right breast cancer 07/2018: Status post lumpectomy 08/19/2018: Invasive papillary cancer 1.2 cm T1CN0 ER/PR positive HER-2 negative status post radiation completed 10/26/2018   Current treatment: Anastrozole started 11/15/2018 Anastrozole toxicities: Benign   Breast cancer surveillance: 1.  Breast exam 04/01/2022: Benign 2. Mammogram done in April 2021 at [facility]: Benign she has not had a mammogram this year.. 3.  Bone density 12/01/2018: T score -1.4: Osteopenia   We will request genetic counseling appointment for the patient. Return to clinic in 1 year for follow-up   Orders Placed This Encounter  Procedures   Ambulatory referral to Genetics    Referral Priority:   Routine    Referral Type:   Consultation    Referral Reason:   Specialty Services Required    Number of Visits Requested:   1   The patient has a good understanding of the overall plan. she agrees with it. she will call with any problems that may develop before the next visit here. Total time spent: 30 mins including face to face time and time spent for planning, charting and co-ordination of care   Harriette Ohara, MD 04/02/22    I Gardiner Coins am scribing for Dr. Lindi Adie  I have reviewed the above documentation for accuracy and completeness, and I agree with the above.

## 2022-04-26 ENCOUNTER — Encounter: Payer: Self-pay | Admitting: Dermatology

## 2022-04-26 NOTE — Progress Notes (Signed)
   Follow-Up Visit   Subjective  Sheila Blair is a 86 y.o. female who presents for the following: Follow-up (Right cheek pink lesions that she is treating with aloe, re check right shin pink scale ).  Follow-up red spots on face plus scale on leg Location:  Duration:  Quality:  Associated Signs/Symptoms: Modifying Factors:  Severity:  Timing: Context:   Objective  Well appearing patient in no apparent distress; mood and affect are within normal limits. Head - Anterior (Face) Still developing central facial erythema with some inflammatory papules  Right Lower Leg - Anterior Subtle 4 mm flat pink scale with 1 sharp margins suggestive D SAP.  Historically stable.       All sun exposed areas plus back examined.   Assessment & Plan    Rosacea Head - Anterior (Face)  if her insurance will not cover Soolantra or generic ivermectin cream, we have given the pharmacist the okay to switch to MetroGel or MetroCream.  Apply nightly for 6 weeks.  Taper if improves.  Ivermectin 1 % CREA - Head - Anterior (Face) Apply to face daily for rosacea  AK (actinic keratosis) Right Lower Leg - Anterior  Return if there is growth or bleeding.      I, Lavonna Monarch, MD, have reviewed all documentation for this visit.  The documentation on 04/26/22 for the exam, diagnosis, procedures, and orders are all accurate and complete.

## 2022-05-06 ENCOUNTER — Inpatient Hospital Stay: Payer: Medicare PPO | Admitting: Genetic Counselor

## 2022-06-24 DIAGNOSIS — Z23 Encounter for immunization: Secondary | ICD-10-CM | POA: Diagnosis not present

## 2022-08-13 ENCOUNTER — Ambulatory Visit: Payer: Self-pay | Admitting: *Deleted

## 2022-08-13 ENCOUNTER — Encounter: Payer: Self-pay | Admitting: *Deleted

## 2022-08-13 DIAGNOSIS — Z79899 Other long term (current) drug therapy: Secondary | ICD-10-CM | POA: Diagnosis not present

## 2022-08-13 DIAGNOSIS — I10 Essential (primary) hypertension: Secondary | ICD-10-CM | POA: Diagnosis not present

## 2022-08-13 DIAGNOSIS — E1129 Type 2 diabetes mellitus with other diabetic kidney complication: Secondary | ICD-10-CM | POA: Diagnosis not present

## 2022-08-13 DIAGNOSIS — E785 Hyperlipidemia, unspecified: Secondary | ICD-10-CM | POA: Diagnosis not present

## 2022-08-13 DIAGNOSIS — I4821 Permanent atrial fibrillation: Secondary | ICD-10-CM | POA: Diagnosis not present

## 2022-08-13 DIAGNOSIS — F419 Anxiety disorder, unspecified: Secondary | ICD-10-CM | POA: Diagnosis not present

## 2022-08-13 NOTE — Patient Instructions (Signed)
Visit Information  Thank you for taking time to visit with me today. Please don't hesitate to contact me if I can be of assistance to you.   Please call the care guide team at [phone] if you need to cancel or reschedule your appointment.   If you are experiencing a Mental Health or Behavioral Health Crisis or need someone to talk to, please call the Suicide and Crisis Lifeline: 988 call the [place] National Suicide Prevention Lifeline: 1-800-273-8255 or TTY: 1-800-799-4 TTY (1-800-799-4889) to talk to a trained counselor call 1-800-273-TALK (toll free, 24 hour hotline) go to [facility] [address], [place] ([phone]) call the [place] Crisis Line: 800-939-9988 call 911  Patient verbalizes understanding of instructions and care plan provided today and agrees to view in MyChart. Active MyChart status and patient understanding of how to access instructions and care plan via MyChart confirmed with patient.     No further follow up required.  Chan Sheahan, BSW, MSW, LCSW  Licensed Clinical Social Worker  [facility]  Mailing Address-[address], [place], [place] [address] Physical Address-[address], [place], [place] [address] Toll Free Main # [phone] Fax # [phone] Cell # [phone] [email or web address]            

## 2022-08-13 NOTE — Patient Outreach (Signed)
  Care Coordination   Initial Visit Note   08/13/2022  Name: Sheila Blair MRN: [id number] DOB: Jan 02, 1936  Sheila Blair is a 86 y.o. year old female who sees Asencion Noble, MD for primary care. I spoke with Sheila Blair by phone today.  What matters to the patients health and wellness today?  No Interventions Identified.  SDOH assessments and interventions completed:  Yes.  SDOH Interventions Today    Flowsheet Row Most Recent Value  SDOH Interventions   Food Insecurity Interventions Intervention Not Indicated  Housing Interventions Intervention Not Indicated  Transportation Interventions Intervention Not Indicated  Utilities Interventions Intervention Not Indicated  Alcohol Usage Interventions Intervention Not Indicated (Score <7)  Financial Strain Interventions Intervention Not Indicated  Physical Activity Interventions Patient Refused  Stress Interventions Intervention Not Indicated  Social Connections Interventions Intervention Not Indicated     Care Coordination Interventions Activated:  Yes.   Care Coordination Interventions:  Yes, provided.   Follow up plan: No further intervention required.   Encounter Outcome:  Pt. Visit Completed.   Sheila Blair, BSW, MSW, LCSW  Licensed Education officer, [facility]  Mailing [place] [address] [place], [place] [address] Physical Address-[address], [place], [place] [address] Toll Free Main # [phone] Fax # [phone] Cell # [phone] [email or web address]

## 2022-08-14 DIAGNOSIS — M79671 Pain in right foot: Secondary | ICD-10-CM | POA: Diagnosis not present

## 2022-08-14 DIAGNOSIS — M25562 Pain in left knee: Secondary | ICD-10-CM | POA: Diagnosis not present

## 2022-08-20 DIAGNOSIS — E1122 Type 2 diabetes mellitus with diabetic chronic kidney disease: Secondary | ICD-10-CM | POA: Diagnosis not present

## 2022-08-20 DIAGNOSIS — E785 Hyperlipidemia, unspecified: Secondary | ICD-10-CM | POA: Diagnosis not present

## 2022-08-20 DIAGNOSIS — R809 Proteinuria, unspecified: Secondary | ICD-10-CM | POA: Diagnosis not present

## 2022-08-20 DIAGNOSIS — I10 Essential (primary) hypertension: Secondary | ICD-10-CM | POA: Diagnosis not present

## 2022-08-20 DIAGNOSIS — I4819 Other persistent atrial fibrillation: Secondary | ICD-10-CM | POA: Diagnosis not present

## 2022-08-20 DIAGNOSIS — R7309 Other abnormal glucose: Secondary | ICD-10-CM | POA: Diagnosis not present

## 2022-08-20 DIAGNOSIS — D6869 Other thrombophilia: Secondary | ICD-10-CM | POA: Diagnosis not present

## 2022-09-03 NOTE — Progress Notes (Unsigned)
Cardiology Office Note  Date: 09/04/2022   ID: Sheila Blair, DOB [birth year], MRN [id number]  PCP:  Asencion Noble, MD  Cardiologist:  Rozann Lesches, MD Electrophysiologist:  None   Chief Complaint  Patient presents with   Cardiac follow-up    History of Present Illness: Sheila Blair is an 86 y.o. female last seen in May.  She is here for a routine visit.  Reports no palpitations or chest pain, stable NYHA class II dyspnea.  I went over her medications.  She switched from Pradaxa to Eliquis in the interim, I agree with this change.  Tolerating it well so far.  Recent lab work reviewed with normal hemoglobin and renal function.  She does not report any spontaneous bleeding problems.  Heart rate is well controlled on combination of Toprol-XL and Cardizem CD.   Past Medical History:  Diagnosis Date   Anxiety    Arthritis    Atrial fibrillation ([place])    Atypical nevus 09/21/2013   Mild-Left tricep   Breast cancer ([place])    BILATERAL    Difficult intubation    PT REPORTS NARROW AIRWAY BUT  HAS HAD 17 SURGERIES WITHOUT A PROBLEM - SURGERIES AT [facility] AND AT [facility] PER PT    Essential hypertension    Hypothyroidism    SCC (squamous cell carcinoma) 12/19/2019   right lower leg anterior-txpbx   SCC (squamous cell carcinoma) 11/12/2021   right arm   SCCA (squamous cell carcinoma) of skin 04/06/2017   RIGHT UPPER ARM-CX3,5FU   Sleep apnea    CPAP    Type 2 diabetes mellitus ([place])     Past Surgical History:  Procedure Laterality Date   ABDOMINAL HYSTERECTOMY     ABDOMINAL SURGERY     APPENDECTOMY     benign thyroid tumor     BREAST LUMPECTOMY Left 2008   BREAST LUMPECTOMY Right 2019   BREAST SURGERY     CATARACT EXTRACTION     CHOLECYSTECTOMY     COLONOSCOPY N/A 07/10/2016   Procedure: COLONOSCOPY;  Surgeon: Rogene Houston, MD;  [facility];  Service: Endoscopy;  Laterality: N/A;  930 - moved to 11/2 @ 9:30   EYE SURGERY  2015   to correct drooping eye  lids   HERNIA REPAIR     pseudoptosis Bilateral    rectocyle surgery     SMALL INTESTINE SURGERY     TOTAL HIP ARTHROPLASTY Right 12/05/2020   Procedure: TOTAL HIP ARTHROPLASTY ANTERIOR APPROACH;  Surgeon: Gaynelle Arabian, MD;  Location: [facility];  Service: Orthopedics;  Laterality: Right;  148mn   VEIN LIGATION Right     Current Outpatient Medications  Medication Sig Dispense Refill   anastrozole (ARIMIDEX) 1 MG tablet TAKE 1 TABLET BY MOUTH DAILY. 90 tablet 3   atorvastatin (LIPITOR) 10 MG tablet Take 10 mg by mouth at bedtime.     Biotin 1000 MCG tablet Take 1,000 mcg by mouth daily.     calcium carbonate (OS-CAL - DOSED IN MG OF ELEMENTAL CALCIUM) 1250 (500 Ca) MG tablet Take 1 tablet by mouth daily with breakfast.     cholecalciferol (VITAMIN D3) 25 MCG (1000 UNIT) tablet Take 1,000 Units by mouth daily.     citalopram (CELEXA) 20 MG tablet Take 20 mg by mouth daily.     diltiazem (CARDIZEM CD) 180 MG 24 hr capsule Take 180 mg by mouth daily.     docusate sodium (COLACE) 100 MG capsule Take 100 mg by mouth 2 (  two) times daily.     ELIQUIS 5 MG TABS tablet Take 5 mg by mouth 2 (two) times daily.     hydrochlorothiazide (MICROZIDE) 12.5 MG capsule Take 12.5 mg by mouth daily.     Ivermectin 1 % CREA Apply to face daily for rosacea 30 g 1   latanoprost (XALATAN) 0.005 % ophthalmic solution Place 1 drop into the right eye at bedtime.     metoprolol succinate (TOPROL-XL) 50 MG 24 hr tablet Take 1 tablet (50 mg total) by mouth daily. Take with or immediately following a meal. 90 tablet 3   metroNIDAZOLE (METROGEL) 0.75 % gel Apply 1 Application topically at bedtime. 45 g 9   olmesartan (BENICAR) 5 MG tablet Take 5 mg by mouth at bedtime.     Polyethyl Glycol-Propyl Glycol 0.4-0.3 % SOLN Place 1 drop into both eyes 2 (two) times daily as needed (dry eyes).     No current facility-administered medications for this visit.   Allergies:  Codeine and Morphine and related   ROS: No orthopnea  or PND.  Physical Exam: VS:  BP (!) 140/72   Pulse 88   Ht '5\' 7"'$  (1.702 m)   Wt 202 lb (91.6 kg)   SpO2 94%   BMI 31.64 kg/m , BMI Body mass index is 31.64 kg/m.  Wt Readings from Last 3 Encounters:  09/04/22 202 lb (91.6 kg)  04/02/22 203 lb 1.6 oz (92.1 kg)  02/24/22 201 lb 3.2 oz (91.3 kg)    General: Patient appears comfortable at rest. HEENT: Conjunctiva and lids normal. Neck: Supple, no elevated JVP or carotid bruits. Lungs: Clear to auscultation, nonlabored breathing at rest. Cardiac: Irregularly irregular, no S3, 1/6 systolic murmur. Extremities: No pitting edema.  ECG:  An ECG dated 01/30/2022 was personally reviewed today and demonstrated:  Atrial fibrillation with nonspecific T wave changes.  Recent Labwork: 09/11/2021: BUN 15; Creatinine, Ser 0.96; Hemoglobin 14.7; Platelets 228; Potassium 3.9; Sodium 143  November 2023: Hemoglobin 15.3, platelets 196, BUN 15, creatinine 0.79, potassium 3.6, AST 32, ALT 22, cholesterol 128, triglycerides 90, HDL 55, LDL 56, hemoglobin A1c 5.8%  Other Studies Reviewed Today:  Echocardiogram 03/26/2021:  1. Left ventricular ejection fraction, by estimation, is 60 to 65%. The  left ventricle has normal function. The left ventricle has no regional  wall motion abnormalities. There is mild left ventricular hypertrophy.  Left ventricular diastolic parameters  are indeterminate.   2. Right ventricular systolic function is normal. The right ventricular  size is normal. There is mildly elevated pulmonary artery systolic  pressure.   3. Left atrial size was severely dilated.   4. Right atrial size was severely dilated.   5. The mitral valve is normal in structure. Mild mitral valve  regurgitation. No evidence of mitral stenosis.   6. The aortic valve is tricuspid. Aortic valve regurgitation is mild. No  aortic stenosis is present.   Assessment and Plan:  1.  Permanent atrial fibrillation with CHA2DS2-VASc score of 6.  She is doing  well without active palpitations on combination of Toprol-XL and Cardizem CD.  Agree with change to Eliquis, she is tolerating this well.  I reviewed her recent lab work.  No changes made today.  2.  Essential hypertension, also on Benicar.  Keep follow-up with Dr. Willey Blade.  3.  Mixed hyperlipidemia on Lipitor.  Last LDL 56.  Medication Adjustments/Labs and Tests Ordered: Current medicines are reviewed at length with the patient today.  Concerns regarding medicines are outlined above.  Tests Ordered: No orders of the defined types were placed in this encounter.   Medication Changes: No orders of the defined types were placed in this encounter.   Disposition:  Follow up  6 months.  Signed, Satira Sark, MD, [facility] 09/04/2022 10:36 AM    [facility] [place]. [address], [place],  [address] Phone: [phone]; Fax: [phone]

## 2022-09-04 ENCOUNTER — Encounter: Payer: Self-pay | Admitting: Cardiology

## 2022-09-04 ENCOUNTER — Ambulatory Visit: Payer: Medicare PPO | Attending: Cardiology | Admitting: Cardiology

## 2022-09-04 VITALS — BP 140/72 | HR 88 | Ht 67.0 in | Wt 202.0 lb

## 2022-09-04 DIAGNOSIS — E782 Mixed hyperlipidemia: Secondary | ICD-10-CM | POA: Diagnosis not present

## 2022-09-04 DIAGNOSIS — I4821 Permanent atrial fibrillation: Secondary | ICD-10-CM | POA: Diagnosis not present

## 2022-09-04 DIAGNOSIS — I10 Essential (primary) hypertension: Secondary | ICD-10-CM

## 2022-09-04 NOTE — Patient Instructions (Signed)
Medication Instructions:  Your physician recommends that you continue on your current medications as directed. Please refer to the Current Medication list given to you today.   Labwork: None today  Testing/Procedures: None today  Follow-Up: 6 months  Any Other Special Instructions Will Be Listed Below (If Applicable).  If you need a refill on your cardiac medications before your next appointment, please call your pharmacy.  

## 2022-10-02 DIAGNOSIS — H401132 Primary open-angle glaucoma, bilateral, moderate stage: Secondary | ICD-10-CM | POA: Diagnosis not present

## 2022-10-09 IMAGING — MG DIGITAL DIAGNOSTIC BILAT W/ TOMO W/ CAD
6 of 10 series · 6 of 26 positions shown · non-contrast
Comparison: Previous exam(s).

CLINICAL DATA: Right lumpectomy in 1040.  Left lumpectomy in 2338.

EXAM:
DIGITAL DIAGNOSTIC BILATERAL MAMMOGRAM WITH TOMOSYNTHESIS AND CAD
TECHNIQUE: Bilateral digital diagnostic mammography and breast tomosynthesis
was performed. The images were evaluated with computer-aided
detection.

[R MLO (1 of 2)]
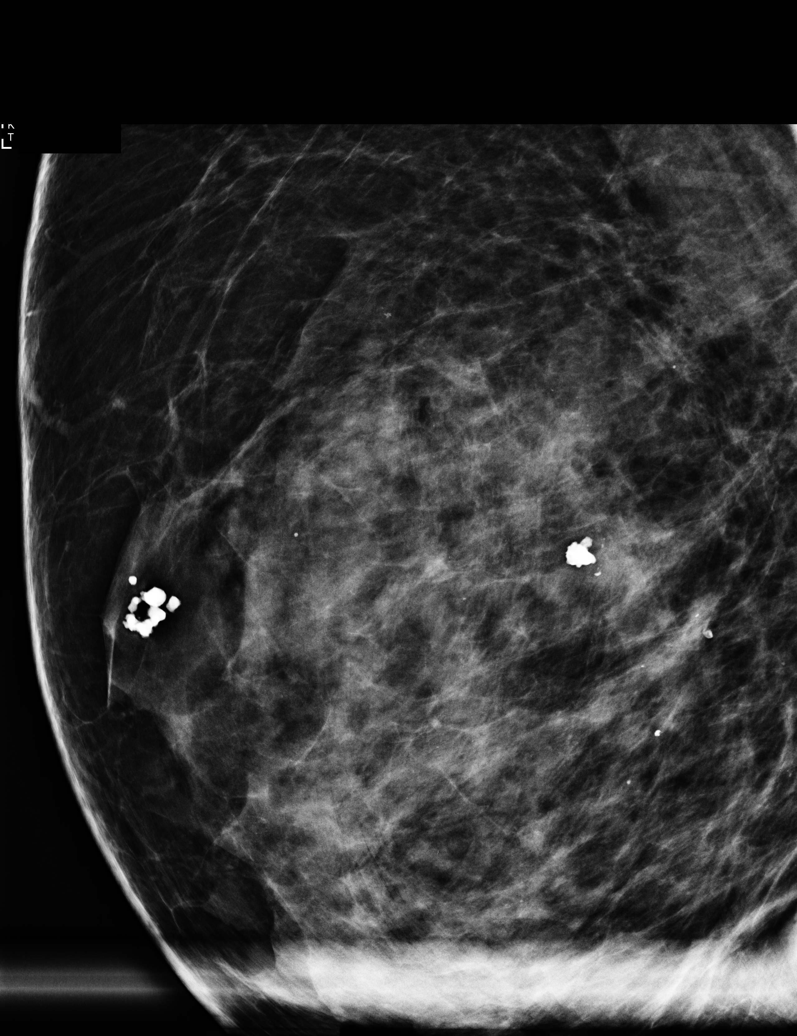

[R MLO (2 of 2)]
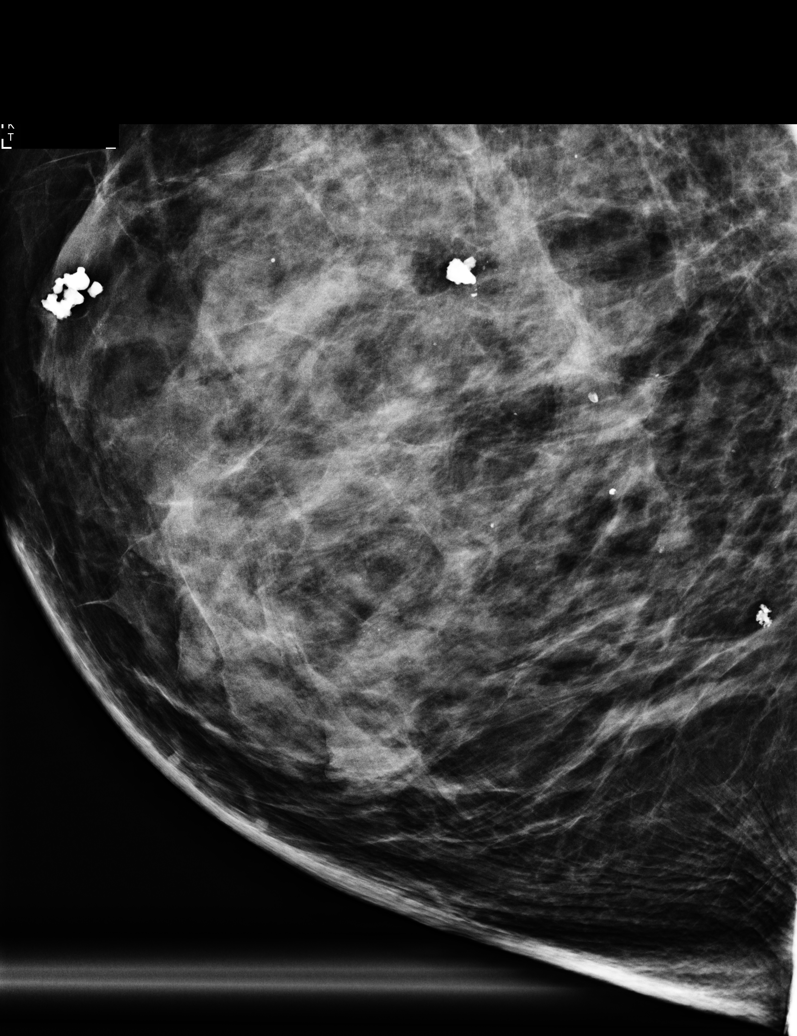

[R MLO synth-2D]
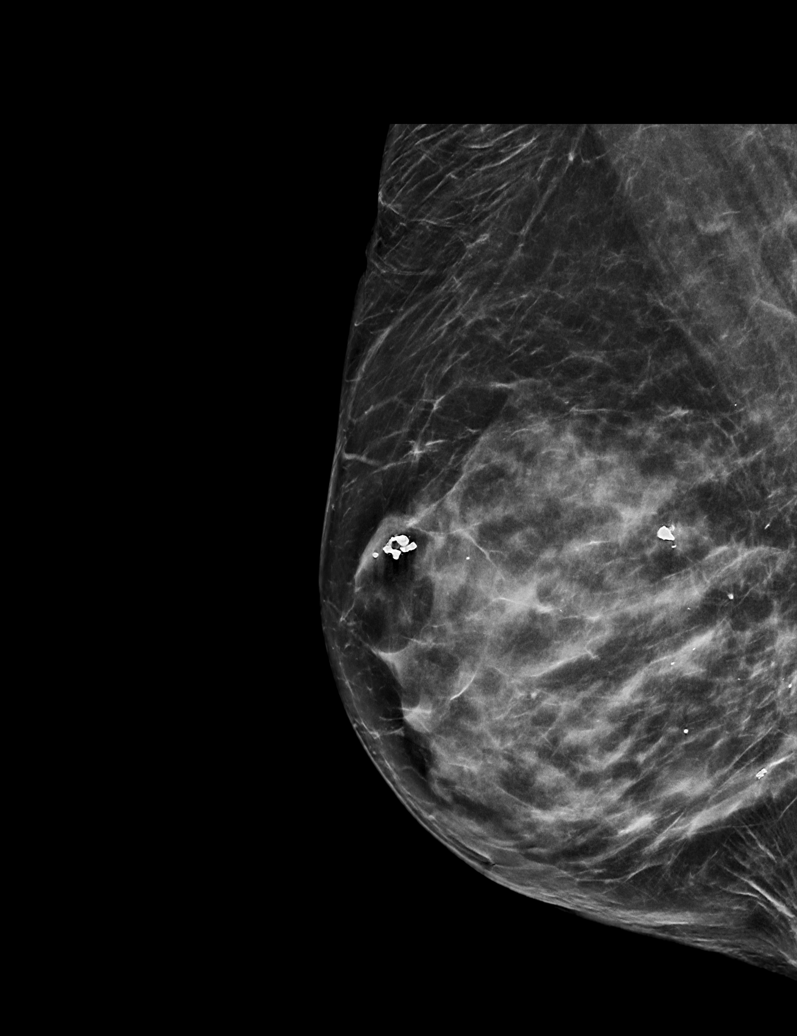

[R CC synth-2D]
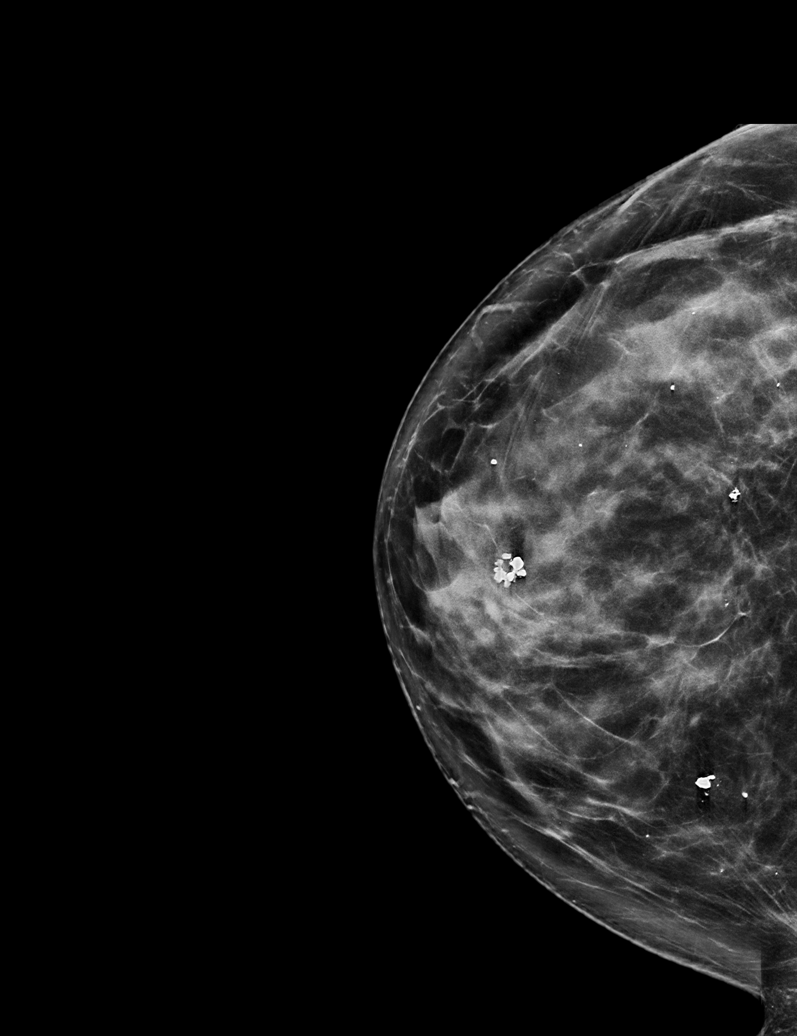

[L MLO synth-2D]
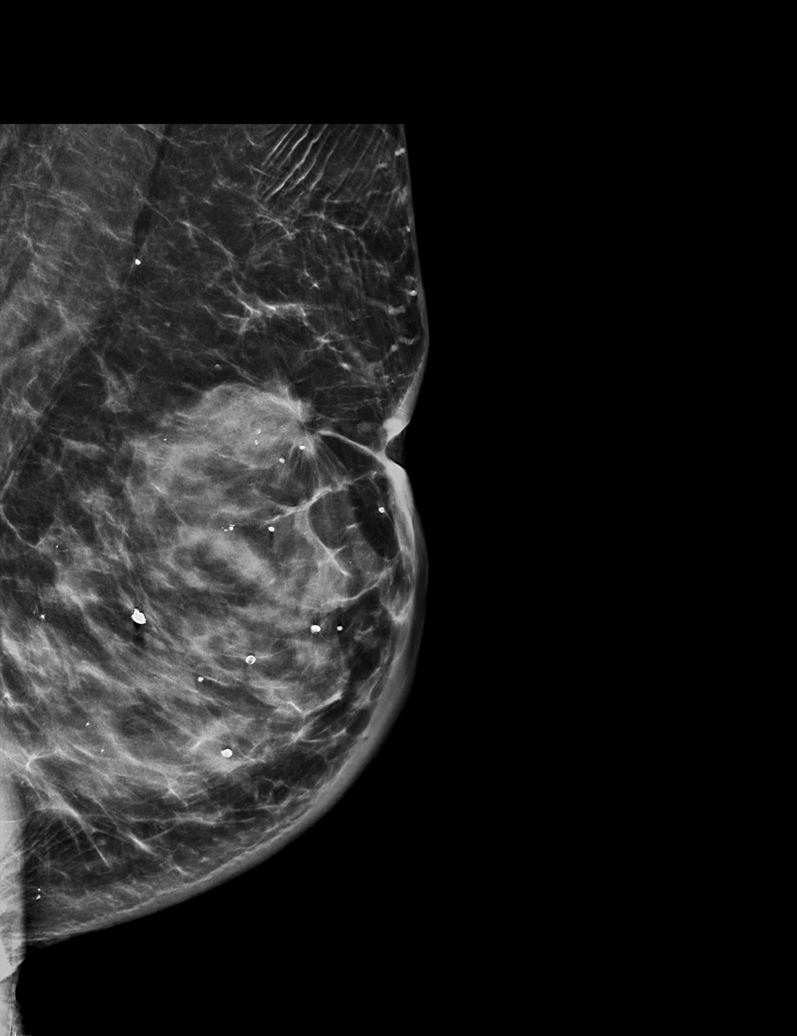

[L CC synth-2D]
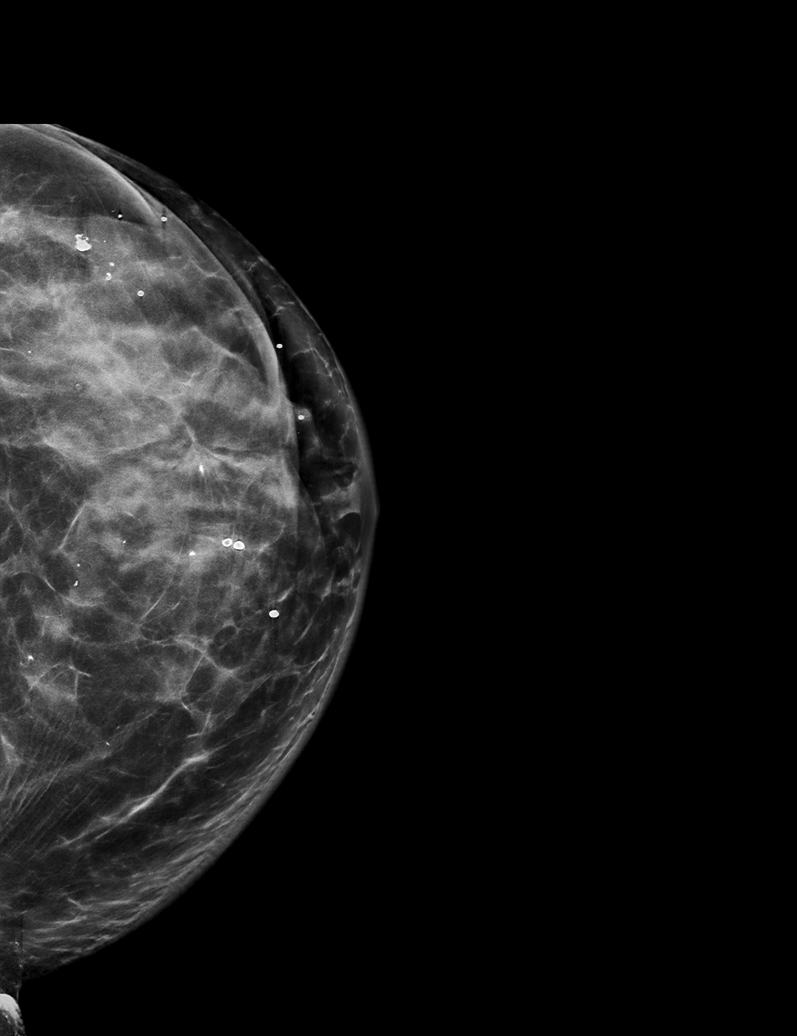

[6 of 26 positions shown; findings below may reference images not displayed]

ACR Breast Density Category c: The breast tissue is heterogeneously
dense, which may obscure small masses.
FINDINGS: No suspicious masses, calcifications, or distortion are identified
in either breast. The right lumpectomy site appears as expected.
IMPRESSION: No mammographic evidence of malignancy.

RECOMMENDATION:
Per protocol, as the patient is now 2 or more years status post
lumpectomy, she may return to annual screening mammography in 1
year. However, given the history of breast cancer, the patient
remains eligible for annual diagnostic mammography if preferred.

I have discussed the findings and recommendations with the patient.
If applicable, a reminder letter will be sent to the patient
regarding the next appointment.

BI-RADS CATEGORY  2: Benign.

## 2022-10-19 DIAGNOSIS — G4733 Obstructive sleep apnea (adult) (pediatric): Secondary | ICD-10-CM | POA: Diagnosis not present

## 2022-10-23 DIAGNOSIS — M170 Bilateral primary osteoarthritis of knee: Secondary | ICD-10-CM | POA: Diagnosis not present

## 2022-10-30 DIAGNOSIS — M170 Bilateral primary osteoarthritis of knee: Secondary | ICD-10-CM | POA: Diagnosis not present

## 2022-10-30 DIAGNOSIS — L089 Local infection of the skin and subcutaneous tissue, unspecified: Secondary | ICD-10-CM | POA: Diagnosis not present

## 2022-11-05 DIAGNOSIS — M170 Bilateral primary osteoarthritis of knee: Secondary | ICD-10-CM | POA: Diagnosis not present

## 2022-11-25 ENCOUNTER — Emergency Department (HOSPITAL_COMMUNITY): Payer: Medicare PPO

## 2022-11-25 ENCOUNTER — Other Ambulatory Visit: Payer: Self-pay

## 2022-11-25 ENCOUNTER — Observation Stay (HOSPITAL_COMMUNITY)
Admission: EM | Admit: 2022-11-25 | Discharge: 2022-11-26 | Disposition: A | Discharge status: Home / Self Care | Payer: Medicare PPO | Attending: Internal Medicine | Admitting: Internal Medicine

## 2022-11-25 ENCOUNTER — Encounter (HOSPITAL_COMMUNITY): Payer: Self-pay

## 2022-11-25 DIAGNOSIS — K6389 Other specified diseases of intestine: Secondary | ICD-10-CM | POA: Diagnosis not present

## 2022-11-25 DIAGNOSIS — I4891 Unspecified atrial fibrillation: Secondary | ICD-10-CM | POA: Diagnosis present

## 2022-11-25 DIAGNOSIS — I480 Paroxysmal atrial fibrillation: Secondary | ICD-10-CM | POA: Insufficient documentation

## 2022-11-25 DIAGNOSIS — E039 Hypothyroidism, unspecified: Secondary | ICD-10-CM | POA: Insufficient documentation

## 2022-11-25 DIAGNOSIS — Z853 Personal history of malignant neoplasm of breast: Secondary | ICD-10-CM | POA: Insufficient documentation

## 2022-11-25 DIAGNOSIS — Z79899 Other long term (current) drug therapy: Secondary | ICD-10-CM | POA: Insufficient documentation

## 2022-11-25 DIAGNOSIS — Z96641 Presence of right artificial hip joint: Secondary | ICD-10-CM | POA: Insufficient documentation

## 2022-11-25 DIAGNOSIS — R1032 Left lower quadrant pain: Secondary | ICD-10-CM | POA: Diagnosis present

## 2022-11-25 DIAGNOSIS — I10 Essential (primary) hypertension: Secondary | ICD-10-CM | POA: Diagnosis present

## 2022-11-25 DIAGNOSIS — E876 Hypokalemia: Secondary | ICD-10-CM | POA: Diagnosis not present

## 2022-11-25 DIAGNOSIS — E119 Type 2 diabetes mellitus without complications: Secondary | ICD-10-CM | POA: Diagnosis not present

## 2022-11-25 DIAGNOSIS — K56609 Unspecified intestinal obstruction, unspecified as to partial versus complete obstruction: Principal | ICD-10-CM | POA: Diagnosis present

## 2022-11-25 DIAGNOSIS — C50911 Malignant neoplasm of unspecified site of right female breast: Secondary | ICD-10-CM | POA: Diagnosis present

## 2022-11-25 DIAGNOSIS — R1084 Generalized abdominal pain: Secondary | ICD-10-CM | POA: Diagnosis not present

## 2022-11-25 DIAGNOSIS — R0902 Hypoxemia: Secondary | ICD-10-CM | POA: Diagnosis not present

## 2022-11-25 DIAGNOSIS — G4733 Obstructive sleep apnea (adult) (pediatric): Secondary | ICD-10-CM | POA: Diagnosis present

## 2022-11-25 LAB — COMPREHENSIVE METABOLIC PANEL
ALT: 28 U/L (ref 0–44)
AST: 38 U/L (ref 15–41)
Albumin: 4.4 g/dL (ref 3.5–5.0)
Alkaline Phosphatase: 90 U/L (ref 38–126)
Anion gap: 16 — ABNORMAL HIGH (ref 5–15)
BUN: 13 mg/dL (ref 8–23)
CO2: 26 mmol/L (ref 22–32)
Calcium: 9.4 mg/dL (ref 8.9–10.3)
Chloride: 91 mmol/L — ABNORMAL LOW (ref 98–111)
Creatinine, Ser: 0.81 mg/dL (ref 0.44–1.00)
GFR, Estimated: >60 mL/min (ref >60)
Glucose, Bld: 111 mg/dL — ABNORMAL HIGH (ref 70–99)
Potassium: 3.2 mmol/L — ABNORMAL LOW (ref 3.5–5.1)
Sodium: 133 mmol/L — ABNORMAL LOW (ref 135–145)
Total Bilirubin: 1.7 mg/dL — ABNORMAL HIGH (ref 0.3–1.2)
Total Protein: 7.8 g/dL (ref 6.5–8.1)

## 2022-11-25 LAB — LIPASE, BLOOD: Lipase: 23 U/L (ref 11–51)

## 2022-11-25 LAB — URINALYSIS, MICROSCOPIC (REFLEX)

## 2022-11-25 LAB — CBC WITH DIFFERENTIAL/PLATELET
Abs Immature Granulocytes: 0.03 10*3/uL (ref 0.00–0.07)
Basophils Absolute: 0 10*3/uL (ref 0.0–0.1)
Basophils Relative: 0 %
Eosinophils Absolute: 0 10*3/uL (ref 0.0–0.5)
Eosinophils Relative: 0 %
HCT: 47.2 % — ABNORMAL HIGH (ref 36.0–46.0)
Hemoglobin: 16.1 g/dL — ABNORMAL HIGH (ref 12.0–15.0)
Immature Granulocytes: 0 %
Lymphocytes Relative: 13 %
Lymphs Abs: 1.1 10*3/uL (ref 0.7–4.0)
MCH: 31.6 pg (ref 26.0–34.0)
MCHC: 34.1 g/dL (ref 30.0–36.0)
MCV: 92.5 fL (ref 80.0–100.0)
Monocytes Absolute: 0.5 10*3/uL (ref 0.1–1.0)
Monocytes Relative: 6 %
Neutro Abs: 6.9 10*3/uL (ref 1.7–7.7)
Neutrophils Relative %: 81 %
Platelets: 216 10*3/uL (ref 150–400)
RBC: 5.1 MIL/uL (ref 3.87–5.11)
RDW: 13.5 % (ref 11.5–15.5)
WBC: 8.6 10*3/uL (ref 4.0–10.5)
nRBC: 0 % (ref 0.0–0.2)

## 2022-11-25 LAB — MAGNESIUM: Magnesium: 1.8 mg/dL (ref 1.7–2.4)

## 2022-11-25 LAB — URINALYSIS, ROUTINE W REFLEX MICROSCOPIC
Glucose, UA: NEGATIVE mg/dL
Hgb urine dipstick: NEGATIVE
Ketones, ur: 15 mg/dL — AB
Leukocytes,Ua: NEGATIVE
Nitrite: NEGATIVE
Protein, ur: 100 mg/dL — AB
Specific Gravity, Urine: 1.025 (ref 1.005–1.030)
pH: 7 (ref 5.0–8.0)

## 2022-11-25 LAB — LACTIC ACID, PLASMA: Lactic Acid, Venous: 1.6 mmol/L (ref 0.5–1.9)

## 2022-11-25 MED ORDER — ACETAMINOPHEN 650 MG RE SUPP: 650.0000 mg | Freq: Four times a day (QID) | RECTAL | Status: DC | PRN

## 2022-11-25 MED ORDER — LABETALOL HCL 5 MG/ML IV SOLN: 10.0000 mg | INTRAVENOUS | Status: DC | PRN

## 2022-11-25 MED ORDER — LORAZEPAM 2 MG/ML IJ SOLN: 0.5000 mg | Freq: Every evening | INTRAMUSCULAR | Status: DC | PRN

## 2022-11-25 MED ORDER — ACETAMINOPHEN 325 MG PO TABS: 650.0000 mg | ORAL_TABLET | Freq: Four times a day (QID) | ORAL | Status: DC | PRN

## 2022-11-25 MED ORDER — HYDROMORPHONE HCL 1 MG/ML IJ SOLN
0.5000 mg | Freq: Once | INTRAMUSCULAR | Status: AC
  Administered 2022-11-25: 0.5 mg via INTRAVENOUS
  Filled 2022-11-25: qty 0.5

## 2022-11-25 MED ORDER — KCL IN DEXTROSE-NACL 40-5-0.9 MEQ/L-%-% IV SOLN: INTRAVENOUS | Status: DC

## 2022-11-25 MED ORDER — HEPARIN SODIUM (PORCINE) 5000 UNIT/ML IJ SOLN
5000.0000 [IU] | Freq: Three times a day (TID) | INTRAMUSCULAR | Status: DC
  Administered 2022-11-25 – 2022-11-26 (×2): 5000 [IU] via SUBCUTANEOUS
  Filled 2022-11-25 (×2): qty 1

## 2022-11-25 MED ORDER — HYDROMORPHONE HCL 1 MG/ML IJ SOLN
0.5000 mg | INTRAMUSCULAR | Status: DC | PRN
  Administered 2022-11-25: 0.5 mg via INTRAVENOUS
  Filled 2022-11-25: qty 0.5

## 2022-11-25 MED ORDER — ONDANSETRON HCL 4 MG/2ML IJ SOLN
4.0000 mg | Freq: Once | INTRAMUSCULAR | Status: AC
  Administered 2022-11-25: 4 mg via INTRAVENOUS
  Filled 2022-11-25: qty 2

## 2022-11-25 MED ORDER — DIPHENHYDRAMINE HCL 50 MG/ML IJ SOLN
12.5000 mg | Freq: Once | INTRAMUSCULAR | Status: AC
  Administered 2022-11-25: 12.5 mg via INTRAVENOUS
  Filled 2022-11-25: qty 1

## 2022-11-25 MED ORDER — ONDANSETRON HCL 4 MG PO TABS: 4.0000 mg | ORAL_TABLET | Freq: Two times a day (BID) | ORAL | Status: DC | PRN

## 2022-11-25 MED ORDER — HYDROMORPHONE HCL 1 MG/ML IJ SOLN
1.0000 mg | Freq: Once | INTRAMUSCULAR | Status: AC
  Administered 2022-11-25: 1 mg via INTRAVENOUS
  Filled 2022-11-25: qty 1

## 2022-11-25 MED ORDER — IOHEXOL 300 MG/ML  SOLN
100.0000 mL | Freq: Once | INTRAMUSCULAR | Status: AC | PRN
  Administered 2022-11-25: 100 mL via INTRAVENOUS

## 2022-11-25 MED ORDER — ONDANSETRON HCL 4 MG/2ML IJ SOLN: 4.0000 mg | Freq: Two times a day (BID) | INTRAMUSCULAR | Status: DC | PRN

## 2022-11-25 NOTE — Assessment & Plan Note (Signed)
Controlled rate 70s to 80s.  On chronic anticoagulation with Eliquis.  Last dose Eliquis was this morning 2/20. - Hold Eliquis  - hold Cardizem and metoprolol while n.p.o.

## 2022-11-25 NOTE — Progress Notes (Addendum)
Patient brought home CPAP to hospital.  Took patient some sterile water to use in her machine.  Machine at bedside ready for patient.  Was unable to plug machine in red socket, there are no red sockets in patient's room.

## 2022-11-25 NOTE — H&P (Signed)
History and Physical    Sheila Blair [id number] DOB: 1936/08/05 DOA: 11/25/2022  PCP: Asencion Noble, MD   Patient coming from: Home  I have personally briefly reviewed patient's old medical records in [place]  Chief Complaint: Abdominal pain  HPI: Sheila Blair is a 87 y.o. female with medical history significant for hypertension, atrial fibrillation on chronic anticoagulation, diabetes mellitus, OSA. Patient presented to the ED with complaints of severe abdominal pain today left lower side of her abdomen that started this morning and woke up from sleep at about 6 AM.  She reports a good sized bowel movement this morning after abdominal pain started.  No vomiting.  Patient has a history of small bowel obstruction, secondary to adhesions for which she has had bowel surgeries in the past.  She reports last episode of small bowel obstruction was 2010.  She has had 5-6 abdominal surgeries in total.  ED Course: Stable vitals.  Potassium 3.2.  UA not suggestive of infection.  Lactic acid 1.6.  CT abdomen and pelvis shows high-grade partial or complete small bowel obstruction secondary to adhesions. EDP with Dr. Arnoldo Morale, will see in consult in a.m., n.p.o. except for ice chips, no need for NG tube unless patient is vomiting.  Review of Systems: As per HPI all other systems reviewed and negative.  Past Medical History:  Diagnosis Date   Anxiety    Arthritis    Atrial fibrillation ([place])    Atypical nevus 09/21/2013   Mild-Left tricep   Breast cancer ([place])    BILATERAL    Difficult intubation    PT REPORTS NARROW AIRWAY BUT  HAS HAD 17 SURGERIES WITHOUT A PROBLEM - SURGERIES AT [facility] AND AT [facility] PER PT    Essential hypertension    Hypothyroidism    SCC (squamous cell carcinoma) 12/19/2019   right lower leg anterior-txpbx   SCC (squamous cell carcinoma) 11/12/2021   right arm   SCCA (squamous cell carcinoma) of skin 04/06/2017   RIGHT UPPER ARM-CX3,5FU   Sleep apnea     CPAP    Type 2 diabetes mellitus ([place])     Past Surgical History:  Procedure Laterality Date   ABDOMINAL HYSTERECTOMY     ABDOMINAL SURGERY     APPENDECTOMY     benign thyroid tumor     BREAST LUMPECTOMY Left 2008   BREAST LUMPECTOMY Right 2019   BREAST SURGERY     CATARACT EXTRACTION     CHOLECYSTECTOMY     COLONOSCOPY N/A 07/10/2016   Procedure: COLONOSCOPY;  Surgeon: Rogene Houston, MD;  [facility];  Service: Endoscopy;  Laterality: N/A;  930 - moved to 11/2 @ 9:30   EYE SURGERY  2015   to correct drooping eye lids   HERNIA REPAIR     pseudoptosis Bilateral    rectocyle surgery     SMALL INTESTINE SURGERY     TOTAL HIP ARTHROPLASTY Right 12/05/2020   Procedure: TOTAL HIP ARTHROPLASTY ANTERIOR APPROACH;  Surgeon: Gaynelle Arabian, MD;  Location[facility];  Service: Orthopedics;  Laterality: Right;  160mn   VEIN LIGATION Right      reports that she has never smoked. She has never been exposed to tobacco smoke. She has never used smokeless tobacco. She reports that she does not drink alcohol and does not use drugs.  Allergies  Allergen Reactions   Codeine Shortness Of Breath   Morphine And Related Shortness Of Breath    Family History  Problem Relation  Age of Onset   Congestive Heart Failure Mother    Dementia Sister     Prior to Admission medications   Medication Sig Start Date End Date Taking? Authorizing Provider  anastrozole (ARIMIDEX) 1 MG tablet TAKE 1 TABLET BY MOUTH DAILY. 02/10/22   Nicholas Lose, MD  atorvastatin (LIPITOR) 10 MG tablet Take 10 mg by mouth at bedtime. 05/30/13   [provider]  Biotin 1000 MCG tablet Take 1,000 mcg by mouth daily.    [provider]  calcium carbonate (OS-CAL - DOSED IN MG OF ELEMENTAL CALCIUM) 1250 (500 Ca) MG tablet Take 1 tablet by mouth daily with breakfast.    [provider]  cholecalciferol (VITAMIN D3) 25 MCG (1000 UNIT) tablet Take 1,000 Units by mouth daily.    [provider]  citalopram (CELEXA) 20 MG tablet Take 20 mg by mouth daily.    [provider]  diltiazem (CARDIZEM CD) 180 MG 24 hr capsule Take 180 mg by mouth daily. 10/01/21   [provider]  docusate sodium (COLACE) 100 MG capsule Take 100 mg by mouth 2 (two) times daily.    [provider]  ELIQUIS 5 MG TABS tablet Take 5 mg by mouth 2 (two) times daily. 08/20/22   [provider]  hydrochlorothiazide (MICROZIDE) 12.5 MG capsule Take 12.5 mg by mouth daily. 12/02/21   [provider]  Ivermectin 1 % CREA Apply to face daily for rosacea 03/31/22   Lavonna Monarch, MD  latanoprost (XALATAN) 0.005 % ophthalmic solution Place 1 drop into the right eye at bedtime. 01/12/20   [provider]  metoprolol succinate (TOPROL-XL) 50 MG 24 hr tablet Take 1 tablet (50 mg total) by mouth daily. Take with or immediately following a meal. 02/24/22 02/19/23  Satira Sark, MD  metroNIDAZOLE (METROGEL) 0.75 % gel Apply 1 Application topically at bedtime. 04/01/22 04/01/23  Lavonna Monarch, MD  olmesartan (BENICAR) 5 MG tablet Take 5 mg by mouth at bedtime.    [provider]  Polyethyl Glycol-Propyl Glycol 0.4-0.3 % SOLN Place 1 drop into both eyes 2 (two) times daily as needed (dry eyes).    [provider]    Physical Exam: Vitals:   11/25/22 1320 11/25/22 1321 11/25/22 1600 11/25/22 1715  BP: (!) 167/103  (!) 153/80 (!) 163/95  Pulse: 89  84 87  Resp: 20   18  Temp: 97.7 F (36.5 C)   98.2 F (36.8 C)  TempSrc: Oral     SpO2: 94%  96% 95%  Weight:  90.3 kg    Height:  5' 8"$  (1.727 m)      Constitutional: NAD, calm, comfortable Vitals:   11/25/22 1320 11/25/22 1321 11/25/22 1600 11/25/22 1715  BP: (!) 167/103  (!) 153/80 (!) 163/95  Pulse: 89  84 87  Resp: 20   18  Temp: 97.7 F (36.5 C)   98.2 F (36.8 C)  TempSrc: Oral     SpO2: 94%  96% 95%  Weight:  90.3 kg    Height:  5' 8"$  (1.727 m)     Eyes: PERRL, lids and  conjunctivae normal ENMT: Mucous membranes are moist. .  Neck: normal, supple, no masses, no thyromegaly Respiratory: clear to auscultation bilaterally, no wheezing, no crackles. Normal respiratory effort. No accessory muscle use.  Cardiovascular: Regular rate and rhythm, no murmurs / rubs / gallops. No extremity edema.  Extremities warm. Abdomen: Asymmetric abdomen, with diffuse abdominal tenderness worse in left lower abdomen, no  masses palpated. No hepatosplenomegaly. Bowel sounds positive.  Musculoskeletal: no clubbing / cyanosis. No joint deformity upper and lower extremities.  Skin: no rashes, lesions, ulcers. No induration Neurologic: No facial asymmetry, speech clear without aphasia, moving extremities spontaneously Psychiatric: Normal judgment and insight. Alert and oriented x 3. Normal mood.   Labs on Admission: I have personally reviewed following labs and imaging studies  CBC: Recent Labs  Lab 11/25/22 1437  WBC 8.6  NEUTROABS 6.9  HGB 16.1*  HCT 47.2*  MCV 92.5  PLT 123XX123   Basic Metabolic Panel: Recent Labs  Lab 11/25/22 1414  NA 133*  K 3.2*  CL 91*  CO2 26  GLUCOSE 111*  BUN 13  CREATININE 0.81  CALCIUM 9.4   GFR: Estimated Creatinine Clearance: 60.8 mL/min (by C-G formula based on SCr of 0.81 mg/dL). Liver Function Tests: Recent Labs  Lab 11/25/22 1414  AST 38  ALT 28  ALKPHOS 90  BILITOT 1.7*  PROT 7.8  ALBUMIN 4.4   Recent Labs  Lab 11/25/22 1414  LIPASE 23   Urine analysis:    Component Value Date/Time   COLORURINE YELLOW 11/25/2022 1500   APPEARANCEUR CLEAR 11/25/2022 1500   LABSPEC 1.025 11/25/2022 1500   PHURINE 7.0 11/25/2022 1500   GLUCOSEU NEGATIVE 11/25/2022 1500   HGBUR NEGATIVE 11/25/2022 1500   BILIRUBINUR SMALL (A) 11/25/2022 1500   KETONESUR 15 (A) 11/25/2022 1500   PROTEINUR 100 (A) 11/25/2022 1500   UROBILINOGEN 0.2 07/17/2013 0740   NITRITE NEGATIVE 11/25/2022 1500   LEUKOCYTESUR NEGATIVE 11/25/2022 1500     Radiological Exams on Admission: CT ABDOMEN PELVIS W CONTRAST  Result Date: 11/25/2022 CLINICAL DATA:  An 87 year old presents with suspected bowel obstruction. EXAM: CT ABDOMEN AND PELVIS WITH CONTRAST TECHNIQUE: Multidetector CT imaging of the abdomen and pelvis was performed using the standard protocol following bolus administration of intravenous contrast. RADIATION DOSE REDUCTION: This exam was performed according to the departmental dose-optimization program which includes automated exposure control, adjustment of the mA and/or kV according to patient size and/or use of iterative reconstruction technique. CONTRAST:  136m OMNIPAQUE IOHEXOL 300 MG/ML  SOLN COMPARISON:  May of 2012 FINDINGS: Lower chest: Signs of basilar atelectasis and or scarring. No effusion. Marked cardiomegaly with four-chamber enlargement and signs of coronary artery disease. Heart is incompletely imaged. No chest wall abnormality. Hepatobiliary: No suspicious hepatic lesion. Post cholecystectomy. Mild intra and extrahepatic biliary duct dilation following cholecystectomy. This is progressed slightly since more remote imaging from 2012. Likely post cholecystectomy related changes. Pancreas: Mild atrophy of the pancreas without ductal dilation, inflammation or visible lesion. Spleen: Normal size. Mild contour abnormality along the superior aspect of the spleen anteriorly was present previously but is slightly more prominent, likely underlying splenic hamartoma or similar benign process in the spleen. Adrenals/Urinary Tract: Adrenal glands are normal. No signs of hydronephrosis. Mild perinephric stranding of uncertain chronicity. No ureteral dilation. No perivesical stranding, urinary bladder is markedly decompressed. Stomach/Bowel: Decompressed appearance of the colon. Stomach minimally distended without adjacent stranding. Submucosal enhancement and mild mural stratification of the duodenum. Dilated small bowel loops, chronically  dilated but now associated with perienteric stranding. Dilated up to 6 cm, maximal dilation seen on previous imaging from 2012 at 4.8 cm. Central transition point in the low abdomen is seen on the current study along with decompression of the colon. Distorted bowel loops seen on image 61/2 where there is a transition from dilated to nondilated small bowel. Mesenteric edema. Duodenum is decompressed and jejunum is decompressed  proximally becoming more dilated as it enters the central abdomen on image 44/5. Complex transition point seen in the central small bowel mesentery on image 29/5 involving multiple loops of bowel suspicious for complex adhesions or internal hernia. Evidence of mesh reconstruction over the anterior abdominal wall as on previous imaging Vascular/Lymphatic: Calcified aortic atherosclerotic changes. These are moderate. No adenopathy in the abdomen. No adenopathy of the pelvis. Reproductive: Signs of pelvic floor dysfunction and 3 compartment laxity post hysterectomy. Other: Mesenteric edema. No ascites. No pneumatosis. No pneumoperitoneum. Musculoskeletal: Spinal degenerative changes. No acute or destructive bone process. Signs of RIGHT hip arthroplasty. IMPRESSION: 1. High-grade partial or complete small bowel obstruction due to complex adhesions or trans mesenteric internal hernia with multiple sites of bowel transition and complete decompression of the terminal ileum with decompression of the colon. Surgical consultation is advised. 2. Note that there was dilated bowel on previous imaging from 2012. There is more mesenteric edema and there is greater difference in dilated and nondilated loops in the central abdomen with distortion of the distal ileum and displacement into the RIGHT lower quadrant of the small bowel anastomosis. 3. No signs of pneumatosis or pneumoperitoneum. 4. Small-bowel resection changes in the RIGHT lower quadrant as well as abdominal wall mesh reconstruction with similar  appearance. 5. Marked cardiomegaly with four-chamber enlargement and signs of coronary artery disease. 6. Aortic atherosclerosis. Aortic Atherosclerosis (ICD10-I70.0). Electronically Signed   By: Zetta Bills M.D.   On: 11/25/2022 17:04    EKG: None    Assessment/Plan Principal Problem:   SBO (small bowel obstruction) (HCC) Active Problems:   Hypokalemia   Hypertension   Obstructive sleep apnea   Bilateral breast cancer (HCC)   DM (diabetes mellitus) (HCC)   Assessment and Plan: * SBO (small bowel obstruction) (HCC) Abdominal pain, no vomiting.  Last bowel movement was this a.m.  CT abdomen and pelvis with contrast-high-grade partial or complete small bowel obstruction due to complex adhesions or trans mesenteric internal hernia ( Pls see full report).  Prior history of SBO, last episode 2010.  Last Eliquis dose was this morning- 2/20. - EDP to Dr. Arnoldo Morale, n.p.o. except for ice chips, no need for NG tube unless patient is vomiting. -Dilaudid 0.5 every 4 hourly as needed - D5 N/s + 40 KCL 75 c/hr  -Daily BMP, twice daily CBG  Hypokalemia Potassium 3.2. -Replete -Check Mag  Atrial fibrillation (HCC) Controlled rate 70s to 80s.  On chronic anticoagulation with Eliquis.  Last dose Eliquis was this morning 2/20. - Hold Eliquis  - hold Cardizem and metoprolol while n.p.o.  DM (diabetes mellitus) ([place]) Not on medication. - HgbA1c - CBG BID  Bilateral breast cancer ([place]) Hold anastrozole for now while n.p.o.  Obstructive sleep apnea CPAP QHS  Hypertension Stable. -Hold diltiazem, HCTZ, metoprolol, losartan while NPO. -As needed labetalol for systolic greater than 123XX123   DVT prophylaxis: Heparin Code Status: Full Code , confirmed with patient and daughter Curt Bears  at bedside Family Communication: Daughter Curt Bears at bedside. Spouse would be primary decision maker. Disposition Plan: ~ 2 days Consults called:   Gen Surg- Dr. Arnoldo Morale Admission status:  Obs Medsurg     Author: Bethena Roys, MD 11/25/2022 8:25 PM  For on call review [email or web address].

## 2022-11-25 NOTE — Assessment & Plan Note (Signed)
Potassium 3.2. -Replete -Check Mag

## 2022-11-25 NOTE — Assessment & Plan Note (Addendum)
Not on medication. - HgbA1c - CBG BID

## 2022-11-25 NOTE — Assessment & Plan Note (Signed)
Stable. -Hold diltiazem, HCTZ, metoprolol, losartan while NPO. -As needed labetalol for systolic greater than 123XX123

## 2022-11-25 NOTE — Assessment & Plan Note (Signed)
Hold anastrozole for now while n.p.o.

## 2022-11-25 NOTE — Assessment & Plan Note (Signed)
CPAP QHS

## 2022-11-25 NOTE — ED Triage Notes (Signed)
Pt bib ems c/o constant 8/10 lower abdominal pain started this morning with nausea. Last multiple normal BM at 8AM today.  H/O Intestinal blockage, hernia, appendectomy, cholecystectomy. Denies fever, denies dysuria.  172/126 97% RA

## 2022-11-25 NOTE — Assessment & Plan Note (Addendum)
Abdominal pain, no vomiting.  Last bowel movement was this a.m.  CT abdomen and pelvis with contrast-high-grade partial or complete small bowel obstruction due to complex adhesions or trans mesenteric internal hernia ( Pls see full report).  Prior history of SBO, last episode 2010.  Last Eliquis dose was this morning- 2/20. - EDP to Dr. Arnoldo Morale, n.p.o. except for ice chips, no need for NG tube unless patient is vomiting. -Dilaudid 0.5 every 4 hourly as needed - D5 N/s + 40 KCL 75 c/hr  -Daily BMP, twice daily CBG

## 2022-11-25 NOTE — ED Provider Notes (Signed)
Sheila Blair   CSN: [id number] Arrival date & time: 11/25/22  1217     History  Chief Complaint  Patient presents with   Abdominal Pain    Sheila Blair is a 87 y.o. female with a history including atrial fibrillation on Eliquis, sleep apnea, hypertension, type 2 diabetes, history of treated breast cancer, multiple abdominal surgeries resulting in adhesions and a longstanding history of problems with bowel obstruction, presenting with abdominal pain, distention and nausea.  She was uncomfortable last night while sleeping but around 6 AM this morning and had significantly worsening pain, localizing to the lower right abdomen.  She has had nausea without vomiting, she denies fevers or chills.  She has had 3 small bowel movements prior to arrival, most recently occurring at 8 AM today.  She has a chronic abdominal hernia which she feels is unchanged, her symptoms feel like prior episodes of bowel obstruction.  She has had no treatment for her symptoms prior to arrival.  She denies dysuria, diarrhea or constipation.  The history is provided by the patient.       Home Medications Prior to Admission medications   Medication Sig Start Date End Date Taking? Authorizing Provider  anastrozole (ARIMIDEX) 1 MG tablet TAKE 1 TABLET BY MOUTH DAILY. 02/10/22   Nicholas Lose, MD  atorvastatin (LIPITOR) 10 MG tablet Take 10 mg by mouth at bedtime. 05/30/13   [provider]  Biotin 1000 MCG tablet Take 1,000 mcg by mouth daily.    [provider]  calcium carbonate (OS-CAL - DOSED IN MG OF ELEMENTAL CALCIUM) 1250 (500 Ca) MG tablet Take 1 tablet by mouth daily with breakfast.    [provider]  cholecalciferol (VITAMIN D3) 25 MCG (1000 UNIT) tablet Take 1,000 Units by mouth daily.    [provider]  citalopram (CELEXA) 20 MG tablet Take 20 mg by mouth daily.    [provider]  diltiazem (CARDIZEM  CD) 180 MG 24 hr capsule Take 180 mg by mouth daily. 10/01/21   [provider]  docusate sodium (COLACE) 100 MG capsule Take 100 mg by mouth 2 (two) times daily.    [provider]  ELIQUIS 5 MG TABS tablet Take 5 mg by mouth 2 (two) times daily. 08/20/22   [provider]  hydrochlorothiazide (MICROZIDE) 12.5 MG capsule Take 12.5 mg by mouth daily. 12/02/21   [provider]  Ivermectin 1 % CREA Apply to face daily for rosacea 03/31/22   Lavonna Monarch, MD  latanoprost (XALATAN) 0.005 % ophthalmic solution Place 1 drop into the right eye at bedtime. 01/12/20   [provider]  metoprolol succinate (TOPROL-XL) 50 MG 24 hr tablet Take 1 tablet (50 mg total) by mouth daily. Take with or immediately following a meal. 02/24/22 02/19/23  Satira Sark, MD  metroNIDAZOLE (METROGEL) 0.75 % gel Apply 1 Application topically at bedtime. 04/01/22 04/01/23  Lavonna Monarch, MD  olmesartan (BENICAR) 5 MG tablet Take 5 mg by mouth at bedtime.    [provider]  Polyethyl Glycol-Propyl Glycol 0.4-0.3 % SOLN Place 1 drop into both eyes 2 (two) times daily as needed (dry eyes).    [provider]      Allergies    Codeine and Morphine and related    Review of Systems   Review of Systems  Constitutional:  Negative for chills and fever.  HENT:  Negative for congestion and sore throat.  Eyes: Negative.   Respiratory:  Negative for chest tightness and shortness of breath.   Cardiovascular:  Negative for chest pain.  Gastrointestinal:  Positive for abdominal distention, abdominal pain and nausea. Negative for constipation, diarrhea and vomiting.  Genitourinary: Negative.   Musculoskeletal:  Negative for arthralgias, joint swelling and neck pain.  Skin: Negative.  Negative for rash and wound.  Neurological:  Negative for dizziness, weakness, light-headedness, numbness and headaches.  Psychiatric/Behavioral: Negative.    All other systems reviewed  and are negative.   Physical Exam Updated Vital Signs BP (!) 163/95   Pulse 87   Temp 98.2 F (36.8 C)   Resp 18   Ht 5' 8"$  (1.727 m)   Wt 90.3 kg   SpO2 95%   BMI 30.26 kg/m  Physical Exam Vitals and nursing Blair reviewed.  Constitutional:      Appearance: She is well-developed.  HENT:     Head: Normocephalic and atraumatic.  Eyes:     Conjunctiva/sclera: Conjunctivae normal.  Cardiovascular:     Rate and Rhythm: Normal rate and regular rhythm.     Heart sounds: Normal heart sounds.  Pulmonary:     Effort: Pulmonary effort is normal.     Breath sounds: Normal breath sounds. No wheezing.  Abdominal:     General: Bowel sounds are normal. There is distension.     Palpations: Abdomen is soft.     Tenderness: There is generalized abdominal tenderness and tenderness in the right lower quadrant. There is guarding. There is no rebound.     Comments: Increased tympany to percussion in the lower quadrants.  Musculoskeletal:        General: Normal range of motion.     Cervical back: Normal range of motion.  Skin:    General: Skin is warm and dry.  Neurological:     Mental Status: She is alert.     ED Results / Procedures / Treatments   Labs (all labs ordered are listed, but only abnormal results are displayed) Labs Reviewed  COMPREHENSIVE METABOLIC PANEL - Abnormal; Notable for the following components:      Result Value   Sodium 133 (*)    Potassium 3.2 (*)    Chloride 91 (*)    Glucose, Bld 111 (*)    Total Bilirubin 1.7 (*)    Anion gap 16 (*)    All other components within normal limits  URINALYSIS, ROUTINE W REFLEX MICROSCOPIC - Abnormal; Notable for the following components:   Bilirubin Urine SMALL (*)    Ketones, ur 15 (*)    Protein, ur 100 (*)    All other components within normal limits  CBC WITH DIFFERENTIAL/PLATELET - Abnormal; Notable for the following components:   Hemoglobin 16.1 (*)    HCT 47.2 (*)    All other components within normal limits   URINALYSIS, MICROSCOPIC (REFLEX) - Abnormal; Notable for the following components:   Bacteria, UA RARE (*)    All other components within normal limits  LIPASE, BLOOD  LACTIC ACID, PLASMA    EKG None  Radiology CT ABDOMEN PELVIS W CONTRAST  Result Date: 11/25/2022 CLINICAL DATA:  An 87 year old presents with suspected bowel obstruction. EXAM: CT ABDOMEN AND PELVIS WITH CONTRAST TECHNIQUE: Multidetector CT imaging of the abdomen and pelvis was performed using the standard protocol following bolus administration of intravenous contrast. RADIATION DOSE REDUCTION: This exam was performed according to the departmental dose-optimization program which includes automated exposure control, adjustment of the mA and/or kV according to  patient size and/or use of iterative reconstruction technique. CONTRAST:  153m OMNIPAQUE IOHEXOL 300 MG/ML  SOLN COMPARISON:  May of 2012 FINDINGS: Lower chest: Signs of basilar atelectasis and or scarring. No effusion. Marked cardiomegaly with four-chamber enlargement and signs of coronary artery disease. Heart is incompletely imaged. No chest wall abnormality. Hepatobiliary: No suspicious hepatic lesion. Post cholecystectomy. Mild intra and extrahepatic biliary duct dilation following cholecystectomy. This is progressed slightly since more remote imaging from 2012. Likely post cholecystectomy related changes. Pancreas: Mild atrophy of the pancreas without ductal dilation, inflammation or visible lesion. Spleen: Normal size. Mild contour abnormality along the superior aspect of the spleen anteriorly was present previously but is slightly more prominent, likely underlying splenic hamartoma or similar benign process in the spleen. Adrenals/Urinary Tract: Adrenal glands are normal. No signs of hydronephrosis. Mild perinephric stranding of uncertain chronicity. No ureteral dilation. No perivesical stranding, urinary bladder is markedly decompressed. Stomach/Bowel: Decompressed  appearance of the colon. Stomach minimally distended without adjacent stranding. Submucosal enhancement and mild mural stratification of the duodenum. Dilated small bowel loops, chronically dilated but now associated with perienteric stranding. Dilated up to 6 cm, maximal dilation seen on previous imaging from 2012 at 4.8 cm. Central transition point in the low abdomen is seen on the current study along with decompression of the colon. Distorted bowel loops seen on image 61/2 where there is a transition from dilated to nondilated small bowel. Mesenteric edema. Duodenum is decompressed and jejunum is decompressed proximally becoming more dilated as it enters the central abdomen on image 44/5. Complex transition point seen in the central small bowel mesentery on image 29/5 involving multiple loops of bowel suspicious for complex adhesions or internal hernia. Evidence of mesh reconstruction over the anterior abdominal wall as on previous imaging Vascular/Lymphatic: Calcified aortic atherosclerotic changes. These are moderate. No adenopathy in the abdomen. No adenopathy of the pelvis. Reproductive: Signs of pelvic floor dysfunction and 3 compartment laxity post hysterectomy. Other: Mesenteric edema. No ascites. No pneumatosis. No pneumoperitoneum. Musculoskeletal: Spinal degenerative changes. No acute or destructive bone process. Signs of RIGHT hip arthroplasty. IMPRESSION: 1. High-grade partial or complete small bowel obstruction due to complex adhesions or trans mesenteric internal hernia with multiple sites of bowel transition and complete decompression of the terminal ileum with decompression of the colon. Surgical consultation is advised. 2. Blair that there was dilated bowel on previous imaging from 2012. There is more mesenteric edema and there is greater difference in dilated and nondilated loops in the central abdomen with distortion of the distal ileum and displacement into the RIGHT lower quadrant of the small  bowel anastomosis. 3. No signs of pneumatosis or pneumoperitoneum. 4. Small-bowel resection changes in the RIGHT lower quadrant as well as abdominal wall mesh reconstruction with similar appearance. 5. Marked cardiomegaly with four-chamber enlargement and signs of coronary artery disease. 6. Aortic atherosclerosis. Aortic Atherosclerosis (ICD10-I70.0). Electronically Signed   By: GZetta BillsM.D.   On: 11/25/2022 17:04    Procedures Procedures    Medications Ordered in ED Medications  ondansetron (ZOFRAN) injection 4 mg (4 mg Intravenous Given 11/25/22 1435)  HYDROmorphone (DILAUDID) injection 1 mg (1 mg Intravenous Given 11/25/22 1435)  iohexol (OMNIPAQUE) 300 MG/ML solution 100 mL (100 mLs Intravenous Contrast Given 11/25/22 1631)    ED Course/ Medical Decision Making/ A&P                             Medical Decision Making Patient presenting  with abdominal pain and distention, nausea without vomiting, exam suggesting small bowel obstruction.  Other considerations would include mesenteric ischemia however her lactic acid is normal range.  Labs are reassuring, CT imaging does confirm a partial to complete small bowel obstruction in the setting of adhesions.  Amount and/or Complexity of Data Reviewed Labs: ordered.    Details: Reviewed normal WBC count at 8.6.  She does have mild hypokalemia at 3.2. Radiology: ordered.    Details: CT imaging confirming small bowel obstruction. Discussion of management or test interpretation with external provider(s): Discussed with Dr. Arnoldo Morale, since patient is not vomiting would recommend n.p.o. status except for ice chips at this time, he will consult patient in the morning, will plan hospitalist admission.  Call placed to the hospitalist.  Discussed with Dr. Denton Brick who accepts pt for admission.  Risk Prescription drug management.           Final Clinical Impression(s) / ED Diagnoses Final diagnoses:  SBO (small bowel obstruction) [facility])     Rx / DC Orders ED Discharge Orders     None         Landis Martins 11/25/22 Layla Maw, MD 11/26/22 1739

## 2022-11-26 DIAGNOSIS — K56609 Unspecified intestinal obstruction, unspecified as to partial versus complete obstruction: Secondary | ICD-10-CM

## 2022-11-26 LAB — BASIC METABOLIC PANEL
Anion gap: 8 (ref 5–15)
BUN: 16 mg/dL (ref 8–23)
CO2: 27 mmol/L (ref 22–32)
Calcium: 8.1 mg/dL — ABNORMAL LOW (ref 8.9–10.3)
Chloride: 100 mmol/L (ref 98–111)
Creatinine, Ser: 1.04 mg/dL — ABNORMAL HIGH (ref 0.44–1.00)
GFR, Estimated: 53 mL/min — ABNORMAL LOW (ref >60)
Glucose, Bld: 113 mg/dL — ABNORMAL HIGH (ref 70–99)
Potassium: 3.4 mmol/L — ABNORMAL LOW (ref 3.5–5.1)
Sodium: 135 mmol/L (ref 135–145)

## 2022-11-26 LAB — HEMOGLOBIN A1C
Hgb A1c MFr Bld: 5.4 % (ref 4.8–5.6)
Mean Plasma Glucose: 108.28 mg/dL

## 2022-11-26 LAB — CBC
HCT: 43 % (ref 36.0–46.0)
Hemoglobin: 14.2 g/dL (ref 12.0–15.0)
MCH: 32 pg (ref 26.0–34.0)
MCHC: 33 g/dL (ref 30.0–36.0)
MCV: 96.8 fL (ref 80.0–100.0)
Platelets: 203 10*3/uL (ref 150–400)
RBC: 4.44 MIL/uL (ref 3.87–5.11)
RDW: 13.7 % (ref 11.5–15.5)
WBC: 6.9 10*3/uL (ref 4.0–10.5)
nRBC: 0 % (ref 0.0–0.2)

## 2022-11-26 LAB — GLUCOSE, CAPILLARY: Glucose-Capillary: 113 mg/dL — ABNORMAL HIGH (ref 70–99)

## 2022-11-26 NOTE — Discharge Summary (Signed)
Physician Discharge Summary  Sheila Blair [id number] DOB: 1935/10/30 DOA: 11/25/2022  PCP: Asencion Noble, MD  Admit date: 11/25/2022  Discharge date: 11/26/2022  Admitted From:Home  Disposition:  Home  Recommendations for Outpatient Follow-up:  Follow up with PCP in 1-2 weeks Follow-up with general surgery as needed and recommended to go to [facility] full liquid diet and having bowel movements, stable for discharge.  Discussed advancing diet slowly to soft over the next 48 hours. Continue home medications as prior  Home Health: None  Equipment/Devices: None  Discharge Condition:Stable  CODE STATUS: Full  Diet recommendation: Heart Healthy/carb modified  Brief/Interim Summary:  Sheila Blair is a 87 y.o. female with medical history significant for hypertension, atrial fibrillation on chronic anticoagulation, diabetes mellitus, OSA. Patient presented to the ED with complaints of severe abdominal pain today left lower side of her abdomen that started this morning and woke up from sleep at about 6 AM.  She was admitted with small bowel obstruction noted on CT imaging.  She was also noted to have some hypokalemia.  She recovered fairly quickly and had multiple liquid bowel movements overnight and is now tolerating full liquid diet.  She was seen by general surgery, Dr. Arnoldo Morale who had advanced her diet and recommends outpatient follow-up as needed.  No other need for aggressive intervention during the course of the stay.  Discharge Diagnoses:  Principal Problem:   SBO (small bowel obstruction) (HCC) Active Problems:   Hypokalemia   Hypertension   Obstructive sleep apnea   Bilateral breast cancer (HCC)   DM (diabetes mellitus) ([place])   Atrial fibrillation ([place])  Principal discharge diagnosis: SBO in the setting of adhesions with multiple prior abdominal surgeries-spontaneously resolved.  Discharge Instructions  Discharge Instructions     Diet - low sodium heart  healthy   Complete by: As directed    Increase activity slowly   Complete by: As directed       Allergies as of 11/26/2022       Reactions   Codeine Shortness Of Breath   Morphine And Related Shortness Of Breath        Medication List     TAKE these medications    acetaminophen 500 MG tablet Commonly known as: TYLENOL Take 1,000 mg by mouth every 6 (six) hours as needed for moderate pain.   anastrozole 1 MG tablet Commonly known as: ARIMIDEX TAKE 1 TABLET BY MOUTH DAILY.   atorvastatin 10 MG tablet Commonly known as: LIPITOR Take 10 mg by mouth at bedtime.   Biotin 1000 MCG tablet Take 1,000 mcg by mouth daily.   calcium carbonate 1250 (500 Ca) MG tablet Commonly known as: OS-CAL - dosed in mg of elemental calcium Take 1 tablet by mouth daily with breakfast.   cholecalciferol 25 MCG (1000 UNIT) tablet Commonly known as: VITAMIN D3 Take 1,000 Units by mouth daily.   citalopram 20 MG tablet Commonly known as: CELEXA Take 20 mg by mouth daily.   diltiazem 180 MG 24 hr capsule Commonly known as: CARDIZEM CD Take 180 mg by mouth daily.   docusate sodium 100 MG capsule Commonly known as: COLACE Take 100 mg by mouth 2 (two) times daily.   Eliquis 5 MG Tabs tablet Generic drug: apixaban Take 5 mg by mouth 2 (two) times daily.   hydrochlorothiazide 12.5 MG capsule Commonly known as: MICROZIDE Take 12.5 mg by mouth daily.   Ivermectin 1 % Crea Apply to face daily for rosacea   latanoprost 0.005 %  ophthalmic solution Commonly known as: XALATAN Place 1 drop into both eyes at bedtime.   metoprolol succinate 50 MG 24 hr tablet Commonly known as: TOPROL-XL Take 1 tablet (50 mg total) by mouth daily. Take with or immediately following a meal.   olmesartan 5 MG tablet Commonly known as: BENICAR Take 5 mg by mouth at bedtime.   Polyethyl Glycol-Propyl Glycol 0.4-0.3 % Soln Place 1 drop into both eyes 2 (two) times daily as needed (dry eyes).   traMADol  50 MG tablet Commonly known as: ULTRAM Take 50 mg by mouth as needed for moderate pain.        Follow-up Information     Asencion Noble, MD. Schedule an appointment as soon as possible for a visit in 1 week(s).   Specialty: Internal Medicine Contact information: [address] [place]  [address] [phone]                Allergies  Allergen Reactions   Codeine Shortness Of Breath   Morphine And Related Shortness Of Breath    Consultations: General surgery   Procedures/Studies: CT ABDOMEN PELVIS W CONTRAST  Result Date: 11/25/2022 CLINICAL DATA:  An 87 year old presents with suspected bowel obstruction. EXAM: CT ABDOMEN AND PELVIS WITH CONTRAST TECHNIQUE: Multidetector CT imaging of the abdomen and pelvis was performed using the standard protocol following bolus administration of intravenous contrast. RADIATION DOSE REDUCTION: This exam was performed according to the departmental dose-optimization program which includes automated exposure control, adjustment of the mA and/or kV according to patient size and/or use of iterative reconstruction technique. CONTRAST:  134m OMNIPAQUE IOHEXOL 300 MG/ML  SOLN COMPARISON:  May of 2012 FINDINGS: Lower chest: Signs of basilar atelectasis and or scarring. No effusion. Marked cardiomegaly with four-chamber enlargement and signs of coronary artery disease. Heart is incompletely imaged. No chest wall abnormality. Hepatobiliary: No suspicious hepatic lesion. Post cholecystectomy. Mild intra and extrahepatic biliary duct dilation following cholecystectomy. This is progressed slightly since more remote imaging from 2012. Likely post cholecystectomy related changes. Pancreas: Mild atrophy of the pancreas without ductal dilation, inflammation or visible lesion. Spleen: Normal size. Mild contour abnormality along the superior aspect of the spleen anteriorly was present previously but is slightly more prominent, likely underlying splenic  hamartoma or similar benign process in the spleen. Adrenals/Urinary Tract: Adrenal glands are normal. No signs of hydronephrosis. Mild perinephric stranding of uncertain chronicity. No ureteral dilation. No perivesical stranding, urinary bladder is markedly decompressed. Stomach/Bowel: Decompressed appearance of the colon. Stomach minimally distended without adjacent stranding. Submucosal enhancement and mild mural stratification of the duodenum. Dilated small bowel loops, chronically dilated but now associated with perienteric stranding. Dilated up to 6 cm, maximal dilation seen on previous imaging from 2012 at 4.8 cm. Central transition point in the low abdomen is seen on the current study along with decompression of the colon. Distorted bowel loops seen on image 61/2 where there is a transition from dilated to nondilated small bowel. Mesenteric edema. Duodenum is decompressed and jejunum is decompressed proximally becoming more dilated as it enters the central abdomen on image 44/5. Complex transition point seen in the central small bowel mesentery on image 29/5 involving multiple loops of bowel suspicious for complex adhesions or internal hernia. Evidence of mesh reconstruction over the anterior abdominal wall as on previous imaging Vascular/Lymphatic: Calcified aortic atherosclerotic changes. These are moderate. No adenopathy in the abdomen. No adenopathy of the pelvis. Reproductive: Signs of pelvic floor dysfunction and 3 compartment laxity post hysterectomy. Other: Mesenteric edema. No  ascites. No pneumatosis. No pneumoperitoneum. Musculoskeletal: Spinal degenerative changes. No acute or destructive bone process. Signs of RIGHT hip arthroplasty. IMPRESSION: 1. High-grade partial or complete small bowel obstruction due to complex adhesions or trans mesenteric internal hernia with multiple sites of bowel transition and complete decompression of the terminal ileum with decompression of the colon. Surgical  consultation is advised. 2. Note that there was dilated bowel on previous imaging from 2012. There is more mesenteric edema and there is greater difference in dilated and nondilated loops in the central abdomen with distortion of the distal ileum and displacement into the RIGHT lower quadrant of the small bowel anastomosis. 3. No signs of pneumatosis or pneumoperitoneum. 4. Small-bowel resection changes in the RIGHT lower quadrant as well as abdominal wall mesh reconstruction with similar appearance. 5. Marked cardiomegaly with four-chamber enlargement and signs of coronary artery disease. 6. Aortic atherosclerosis. Aortic Atherosclerosis (ICD10-I70.0). Electronically Signed   By: Zetta Bills M.D.   On: 11/25/2022 17:04     Discharge Exam: Vitals:   11/26/22 0502 11/26/22 0751  BP: 126/82 (!) 143/76  Pulse: 69 72  Resp: 16 16  Temp: 98.3 F (36.8 C) 98.2 F (36.8 C)  SpO2: 95% 92%   Vitals:   11/25/22 2025 11/25/22 2359 11/26/22 0502 11/26/22 0751  BP: (!) 156/75 121/78 126/82 (!) 143/76  Pulse: 70 71 69 72  Resp: 16 16 16 16  $ Temp: 97.9 F (36.6 C) 97.8 F (36.6 C) 98.3 F (36.8 C) 98.2 F (36.8 C)  TempSrc: Oral Oral  Oral  SpO2: 95% 92% 95% 92%  Weight: 92.5 kg     Height: 5' 8"$  (1.727 m)       General: Pt is alert, awake, not in acute distress Cardiovascular: RRR, S1/S2 +, no rubs, no gallops Respiratory: CTA bilaterally, no wheezing, no rhonchi Abdominal: Soft, NT, ND, bowel sounds + Extremities: no edema, no cyanosis    The results of significant diagnostics from this hospitalization (including imaging, microbiology, ancillary and laboratory) are listed below for reference.     Microbiology: No results found for this or any previous visit (from the past 240 hour(s)).   Labs: BNP (last 3 results) No results for input(s): "BNP" in the last 8760 hours. Basic Metabolic Panel: Recent Labs  Lab 11/25/22 1414 11/25/22 1437 11/26/22 0423  NA 133*  --  135  K  3.2*  --  3.4*  CL 91*  --  100  CO2 26  --  27  GLUCOSE 111*  --  113*  BUN 13  --  16  CREATININE 0.81  --  1.04*  CALCIUM 9.4  --  8.1*  MG  --  1.8  --    Liver Function Tests: Recent Labs  Lab 11/25/22 1414  AST 38  ALT 28  ALKPHOS 90  BILITOT 1.7*  PROT 7.8  ALBUMIN 4.4   Recent Labs  Lab 11/25/22 1414  LIPASE 23   No results for input(s): "AMMONIA" in the last 168 hours. CBC: Recent Labs  Lab 11/25/22 1437 11/26/22 0423  WBC 8.6 6.9  NEUTROABS 6.9  --   HGB 16.1* 14.2  HCT 47.2* 43.0  MCV 92.5 96.8  PLT 216 203   Cardiac Enzymes: No results for input(s): "CKTOTAL", "CKMB", "CKMBINDEX", "TROPONINI" in the last 168 hours. BNP: Invalid input(s): "POCBNP" CBG: Recent Labs  Lab 11/26/22 0726  GLUCAP 113*   D-Dimer No results for input(s): "DDIMER" in the last 72 hours. Hgb A1c Recent Labs  11/25/22 1437  HGBA1C 5.4   Lipid Profile No results for input(s): "CHOL", "HDL", "LDLCALC", "TRIG", "CHOLHDL", "LDLDIRECT" in the last 72 hours. Thyroid function studies No results for input(s): "TSH", "T4TOTAL", "T3FREE", "THYROIDAB" in the last 72 hours.  Invalid input(s): "FREET3" Anemia work up No results for input(s): "VITAMINB12", "FOLATE", "FERRITIN", "TIBC", "IRON", "RETICCTPCT" in the last 72 hours. Urinalysis    Component Value Date/Time   COLORURINE YELLOW 11/25/2022 1500   APPEARANCEUR CLEAR 11/25/2022 1500   LABSPEC 1.025 11/25/2022 1500   PHURINE 7.0 11/25/2022 1500   GLUCOSEU NEGATIVE 11/25/2022 1500   HGBUR NEGATIVE 11/25/2022 1500   BILIRUBINUR SMALL (A) 11/25/2022 1500   KETONESUR 15 (A) 11/25/2022 1500   PROTEINUR 100 (A) 11/25/2022 1500   UROBILINOGEN 0.2 07/17/2013 0740   NITRITE NEGATIVE 11/25/2022 1500   LEUKOCYTESUR NEGATIVE 11/25/2022 1500   Sepsis Labs Recent Labs  Lab 11/25/22 1437 11/26/22 0423  WBC 8.6 6.9   Microbiology No results found for this or any previous visit (from the past 240 hour(s)).   Time  coordinating discharge: 35 minutes  SIGNED:   Rodena Goldmann, DO [facility] 11/26/2022, 9:59 AM  If 7PM-7AM, please contact night-coverage [email or web address]

## 2022-11-26 NOTE — Progress Notes (Signed)
Patient able to tolerate full liquid lunch. No nausea or abd pain noted. Discharge instructions reviewed with patient and patient's husband.  Patient discharged home with husband in stable condition.

## 2022-11-26 NOTE — TOC Progression Note (Signed)
Transition of Care [facility]) - Progression Note    Patient Details  Name: NORETTA HOLZ MRN: [id number] Date of Birth: [birth year]  Transition of Care [facility]) CM/SW Contact  Salome Arnt, [place] Phone Number: 11/26/2022, 10:12 AM  Clinical Narrative:  Transition of Care [facility]) Screening Note   Patient Details  Name: TYRIEL KARGBO Date of Birth: April 22, 1936   Transition of Care (TOC) CM/SW Contact:    Salome Arnt, LCSW Phone Number: 11/26/2022, 10:12 AM    Transition of Care Department [facility]) has reviewed patient and no TOC needs have been identified at this time. We will continue to monitor patient advancement through interdisciplinary progression rounds. If new patient transition needs arise, please place a TOC consult.         Barriers to Discharge: Barriers Resolved  Expected Discharge Plan and Services         Expected Discharge Date: 11/26/22                                     Social Determinants of Health (SDOH) Interventions SDOH Screenings   Food Insecurity: No Food Insecurity (11/25/2022)  Housing: Low Risk  (11/25/2022)  Transportation Needs: No Transportation Needs (11/25/2022)  Utilities: Not At Risk (11/25/2022)  Alcohol Screen: Low Risk  (08/13/2022)  Depression (PHQ2-9): Low Risk  (08/13/2022)  Financial Resource Strain: Low Risk  (08/13/2022)  Physical Activity: Inactive (08/13/2022)  Social Connections: Socially Integrated (08/13/2022)  Stress: No Stress Concern Present (08/13/2022)  Tobacco Use: Low Risk  (11/25/2022)    Readmission Risk Interventions     No data to display

## 2022-11-26 NOTE — Consult Note (Signed)
Reason for Consult: Partial small bowel obstruction Referring Physician: Dr. Bennett Scrape Sheila Blair is an 87 y.o. female.  HPI: Patient is an 87 year old white female who was referred to my care for evaluation and treatment of a small bowel obstruction seen on CT scan of the abdomen.  Patient was admitted yesterday to [facility] through the emergency room.  The patient had increasing abdominal pain and distention.  Her last bowel movement at that time was earlier in the day.  Patient has had multiple abdominal surgeries in the past.  Her last surgery was 14 years ago for lysis of adhesions and the patient was told that her abdomen would be very difficult to explore again.  This was done at [facility].  She is also noted to be a difficult intubation.  Overnight, the patient has had multiple bowel movements and her abdominal pain has subsided.  She denies any nausea or vomiting.  Her abdomen is back to baseline.  She is hungry and desires to go home.  She does not want any surgery unless absolutely necessary.  Patient is chronically anticoagulated with Eliquis for atrial fibrillation.  She did take a dose yesterday.  Past Medical History:  Diagnosis Date   Anxiety    Arthritis    Atrial fibrillation ([place])    Atypical nevus 09/21/2013   Mild-Left tricep   Breast cancer ([place])    BILATERAL    Difficult intubation    PT REPORTS NARROW AIRWAY BUT  HAS HAD 17 SURGERIES WITHOUT A PROBLEM - SURGERIES AT [facility] AND AT [facility] PER PT    Essential hypertension    Hypothyroidism    SCC (squamous cell carcinoma) 12/19/2019   right lower leg anterior-txpbx   SCC (squamous cell carcinoma) 11/12/2021   right arm   SCCA (squamous cell carcinoma) of skin 04/06/2017   RIGHT UPPER ARM-CX3,5FU   Sleep apnea    CPAP    Type 2 diabetes mellitus ([place])     Past Surgical History:  Procedure Laterality Date   ABDOMINAL HYSTERECTOMY     ABDOMINAL SURGERY     APPENDECTOMY     benign thyroid tumor      BREAST LUMPECTOMY Left 2008   BREAST LUMPECTOMY Right 2019   BREAST SURGERY     CATARACT EXTRACTION     CHOLECYSTECTOMY     COLONOSCOPY N/A 07/10/2016   Procedure: COLONOSCOPY;  Surgeon: Rogene Houston, MD;  Location: [facility];  Service: Endoscopy;  Laterality: N/A;  930 - moved to 11/2 @ 9:30   EYE SURGERY  2015   to correct drooping eye lids   HERNIA REPAIR     pseudoptosis Bilateral    rectocyle surgery     SMALL INTESTINE SURGERY     TOTAL HIP ARTHROPLASTY Right 12/05/2020   Procedure: TOTAL HIP ARTHROPLASTY ANTERIOR APPROACH;  Surgeon: Gaynelle Arabian, MD;  Location: [facility] ORS;  Service: Orthopedics;  Laterality: Right;  123mn   VEIN LIGATION Right     Family History  Problem Relation Age of Onset   Congestive Heart Failure Mother    Dementia Sister     Social History:  reports that she has never smoked. She has never been exposed to tobacco smoke. She has never used smokeless tobacco. She reports that she does not drink alcohol and does not use drugs.  Allergies:  Allergies  Allergen Reactions   Codeine Shortness Of Breath   Morphine And Related Shortness Of Breath    Medications: I  have reviewed the patient's current medications. Prior to Admission:  Medications Prior to Admission  Medication Sig Dispense Refill Last Dose   acetaminophen (TYLENOL) 500 MG tablet Take 1,000 mg by mouth every 6 (six) hours as needed for moderate pain.   unknown   anastrozole (ARIMIDEX) 1 MG tablet TAKE 1 TABLET BY MOUTH DAILY. 90 tablet 3 11/24/2022   atorvastatin (LIPITOR) 10 MG tablet Take 10 mg by mouth at bedtime.   11/24/2022   Biotin 1000 MCG tablet Take 1,000 mcg by mouth daily.   11/24/2022   calcium carbonate (OS-CAL - DOSED IN MG OF ELEMENTAL CALCIUM) 1250 (500 Ca) MG tablet Take 1 tablet by mouth daily with breakfast.   11/24/2022   cholecalciferol (VITAMIN D3) 25 MCG (1000 UNIT) tablet Take 1,000 Units by mouth daily.   11/24/2022   citalopram (CELEXA) 20 MG tablet Take 20  mg by mouth daily.   11/25/2022   diltiazem (CARDIZEM CD) 180 MG 24 hr capsule Take 180 mg by mouth daily.   11/25/2022   docusate sodium (COLACE) 100 MG capsule Take 100 mg by mouth 2 (two) times daily.   11/25/2022   ELIQUIS 5 MG TABS tablet Take 5 mg by mouth 2 (two) times daily.   11/25/2022 at 0900   hydrochlorothiazide (MICROZIDE) 12.5 MG capsule Take 12.5 mg by mouth daily.   11/25/2022   Ivermectin 1 % CREA Apply to face daily for rosacea 30 g 1 unknown   latanoprost (XALATAN) 0.005 % ophthalmic solution Place 1 drop into both eyes at bedtime.   11/24/2022   metoprolol succinate (TOPROL-XL) 50 MG 24 hr tablet Take 1 tablet (50 mg total) by mouth daily. Take with or immediately following a meal. 90 tablet 3 11/25/2022 at 0900   olmesartan (BENICAR) 5 MG tablet Take 5 mg by mouth at bedtime.   11/24/2022   Polyethyl Glycol-Propyl Glycol 0.4-0.3 % SOLN Place 1 drop into both eyes 2 (two) times daily as needed (dry eyes).   unknown   traMADol (ULTRAM) 50 MG tablet Take 50 mg by mouth as needed for moderate pain.   unknown    Results for orders placed or performed during the hospital encounter of 11/25/22 (from the past 48 hour(s))  Lipase, blood     Status: None   Collection Time: 11/25/22  2:14 PM  Result Value Ref Range   Lipase 23 11 - 51 U/L    Comment: Performed at [facility], [address], [place], [place] [address]  Comprehensive metabolic panel     Status: Abnormal   Collection Time: 11/25/22  2:14 PM  Result Value Ref Range   Sodium 133 (L) 135 - 145 mmol/L   Potassium 3.2 (L) 3.5 - 5.1 mmol/L   Chloride 91 (L) 98 - 111 mmol/L   CO2 26 22 - 32 mmol/L   Glucose, Bld 111 (H) 70 - 99 mg/dL    Comment: Glucose reference range applies only to samples taken after fasting for at least 8 hours.   BUN 13 8 - 23 mg/dL   Creatinine, Ser 0.81 0.44 - 1.00 mg/dL   Calcium 9.4 8.9 - 10.3 mg/dL   Total Protein 7.8 6.5 - 8.1 g/dL   Albumin 4.4 3.5 - 5.0 g/dL   AST 38 15 - 41 U/L   ALT 28 0 -  44 U/L   Alkaline Phosphatase 90 38 - 126 U/L   Total Bilirubin 1.7 (H) 0.3 - 1.2 mg/dL   GFR, Estimated >60 >60 mL/min  Comment: (NOTE) Calculated using the CKD-EPI Creatinine Equation (2021)    Anion gap 16 (H) 5 - 15    Comment: Performed at [facility], [address] [place], [place] [address]  CBC with Differential     Status: Abnormal   Collection Time: 11/25/22  2:37 PM  Result Value Ref Range   WBC 8.6 4.0 - 10.5 K/uL   RBC 5.10 3.87 - 5.11 MIL/uL   Hemoglobin 16.1 (H) 12.0 - 15.0 g/dL   HCT 47.2 (H) 36.0 - 46.0 %   MCV 92.5 80.0 - 100.0 fL   MCH 31.6 26.0 - 34.0 pg   MCHC 34.1 30.0 - 36.0 g/dL   RDW 13.5 11.5 - 15.5 %   Platelets 216 150 - 400 K/uL   nRBC 0.0 0.0 - 0.2 %   Neutrophils Relative % 81 %   Neutro Abs 6.9 1.7 - 7.7 K/uL   Lymphocytes Relative 13 %   Lymphs Abs 1.1 0.7 - 4.0 K/uL   Monocytes Relative 6 %   Monocytes Absolute 0.5 0.1 - 1.0 K/uL   Eosinophils Relative 0 %   Eosinophils Absolute 0.0 0.0 - 0.5 K/uL   Basophils Relative 0 %   Basophils Absolute 0.0 0.0 - 0.1 K/uL   Immature Granulocytes 0 %   Abs Immature Granulocytes 0.03 0.00 - 0.07 K/uL    Comment: Performed at [facility], [address] [place], [place] [address]  Hemoglobin A1c     Status: None   Collection Time: 11/25/22  2:37 PM  Result Value Ref Range   Hgb A1c MFr Bld 5.4 4.8 - 5.6 %    Comment: (NOTE) Pre diabetes:          5.7%-6.4%  Diabetes:              >6.4%  Glycemic control for   <7.0% adults with diabetes    Mean Plasma Glucose 108.28 mg/dL    Comment: Performed at [facility], [place] [address], [place], [place] [address]  Magnesium     Status: None   Collection Time: 11/25/22  2:37 PM  Result Value Ref Range   Magnesium 1.8 1.7 - 2.4 mg/dL    Comment: Performed at [facility], [address] [place], [place] [address]  Urinalysis, Routine w reflex microscopic -Urine, Clean Catch     Status: Abnormal   Collection Time: 11/25/22  3:00 PM  Result Value  Ref Range   Color, Urine YELLOW YELLOW   APPearance CLEAR CLEAR   Specific Gravity, Urine 1.025 1.005 - 1.030   pH 7.0 5.0 - 8.0   Glucose, UA NEGATIVE NEGATIVE mg/dL   Hgb urine dipstick NEGATIVE NEGATIVE   Bilirubin Urine SMALL (A) NEGATIVE   Ketones, ur 15 (A) NEGATIVE mg/dL   Protein, ur 100 (A) NEGATIVE mg/dL   Nitrite NEGATIVE NEGATIVE   Leukocytes,Ua NEGATIVE NEGATIVE    Comment: Performed at [facility], [address], [place], [place] [address]  Lactic acid, plasma     Status: None   Collection Time: 11/25/22  3:00 PM  Result Value Ref Range   Lactic Acid, Venous 1.6 0.5 - 1.9 mmol/L    Comment: Performed at [facility], [address] [place], [place] [address]  Urinalysis, Microscopic (reflex)     Status: Abnormal   Collection Time: 11/25/22  3:00 PM  Result Value Ref Range   RBC / HPF 0-5 0 - 5 RBC/hpf   WBC, UA 0-5 0 - 5 WBC/hpf  Bacteria, UA RARE (A) NONE SEEN   Squamous Epithelial / HPF 0-5 0 - 5 /HPF    Comment: Performed at [facility], [address], [place], [place] [id number]  Basic metabolic panel     Status: Abnormal   Collection Time: 11/26/22  4:23 AM  Result Value Ref Range   Sodium 135 135 - 145 mmol/L   Potassium 3.4 (L) 3.5 - 5.1 mmol/L   Chloride 100 98 - 111 mmol/L   CO2 27 22 - 32 mmol/L   Glucose, Bld 113 (H) 70 - 99 mg/dL    Comment: Glucose reference range applies only to samples taken after fasting for at least 8 hours.   BUN 16 8 - 23 mg/dL   Creatinine, Ser 1.04 (H) 0.44 - 1.00 mg/dL   Calcium 8.1 (L) 8.9 - 10.3 mg/dL   GFR, Estimated 53 (L) >60 mL/min    Comment: (NOTE) Calculated using the CKD-EPI Creatinine Equation (2021)    Anion gap 8 5 - 15    Comment: Performed at [facility], [address] [place], [place] [address]  CBC     Status: None   Collection Time: 11/26/22  4:23 AM  Result Value Ref Range   WBC 6.9 4.0 - 10.5 K/uL   RBC 4.44 3.87 - 5.11 MIL/uL   Hemoglobin 14.2 12.0 - 15.0 g/dL   HCT 43.0 36.0 - 46.0 %    MCV 96.8 80.0 - 100.0 fL   MCH 32.0 26.0 - 34.0 pg   MCHC 33.0 30.0 - 36.0 g/dL   RDW 13.7 11.5 - 15.5 %   Platelets 203 150 - 400 K/uL   nRBC 0.0 0.0 - 0.2 %    Comment: Performed at [facility], [address], [place], [place] [address]  Glucose, capillary     Status: Abnormal   Collection Time: 11/26/22  7:26 AM  Result Value Ref Range   Glucose-Capillary 113 (H) 70 - 99 mg/dL    Comment: Glucose reference range applies only to samples taken after fasting for at least 8 hours.    CT ABDOMEN PELVIS W CONTRAST  Result Date: 11/25/2022 CLINICAL DATA:  An 87 year old presents with suspected bowel obstruction. EXAM: CT ABDOMEN AND PELVIS WITH CONTRAST TECHNIQUE: Multidetector CT imaging of the abdomen and pelvis was performed using the standard protocol following bolus administration of intravenous contrast. RADIATION DOSE REDUCTION: This exam was performed according to the departmental dose-optimization program which includes automated exposure control, adjustment of the mA and/or kV according to patient size and/or use of iterative reconstruction technique. CONTRAST:  184m OMNIPAQUE IOHEXOL 300 MG/ML  SOLN COMPARISON:  May of 2012 FINDINGS: Lower chest: Signs of basilar atelectasis and or scarring. No effusion. Marked cardiomegaly with four-chamber enlargement and signs of coronary artery disease. Heart is incompletely imaged. No chest wall abnormality. Hepatobiliary: No suspicious hepatic lesion. Post cholecystectomy. Mild intra and extrahepatic biliary duct dilation following cholecystectomy. This is progressed slightly since more remote imaging from 2012. Likely post cholecystectomy related changes. Pancreas: Mild atrophy of the pancreas without ductal dilation, inflammation or visible lesion. Spleen: Normal size. Mild contour abnormality along the superior aspect of the spleen anteriorly was present previously but is slightly more prominent, likely underlying splenic hamartoma or similar benign  process in the spleen. Adrenals/Urinary Tract: Adrenal glands are normal. No signs of hydronephrosis. Mild perinephric stranding of uncertain chronicity. No ureteral dilation. No perivesical stranding, urinary bladder is markedly decompressed. Stomach/Bowel: Decompressed appearance of the colon. Stomach minimally distended without adjacent  stranding. Submucosal enhancement and mild mural stratification of the duodenum. Dilated small bowel loops, chronically dilated but now associated with perienteric stranding. Dilated up to 6 cm, maximal dilation seen on previous imaging from 2012 at 4.8 cm. Central transition point in the low abdomen is seen on the current study along with decompression of the colon. Distorted bowel loops seen on image 61/2 where there is a transition from dilated to nondilated small bowel. Mesenteric edema. Duodenum is decompressed and jejunum is decompressed proximally becoming more dilated as it enters the central abdomen on image 44/5. Complex transition point seen in the central small bowel mesentery on image 29/5 involving multiple loops of bowel suspicious for complex adhesions or internal hernia. Evidence of mesh reconstruction over the anterior abdominal wall as on previous imaging Vascular/Lymphatic: Calcified aortic atherosclerotic changes. These are moderate. No adenopathy in the abdomen. No adenopathy of the pelvis. Reproductive: Signs of pelvic floor dysfunction and 3 compartment laxity post hysterectomy. Other: Mesenteric edema. No ascites. No pneumatosis. No pneumoperitoneum. Musculoskeletal: Spinal degenerative changes. No acute or destructive bone process. Signs of RIGHT hip arthroplasty. IMPRESSION: 1. High-grade partial or complete small bowel obstruction due to complex adhesions or trans mesenteric internal hernia with multiple sites of bowel transition and complete decompression of the terminal ileum with decompression of the colon. Surgical consultation is advised. 2. Note  that there was dilated bowel on previous imaging from 2012. There is more mesenteric edema and there is greater difference in dilated and nondilated loops in the central abdomen with distortion of the distal ileum and displacement into the RIGHT lower quadrant of the small bowel anastomosis. 3. No signs of pneumatosis or pneumoperitoneum. 4. Small-bowel resection changes in the RIGHT lower quadrant as well as abdominal wall mesh reconstruction with similar appearance. 5. Marked cardiomegaly with four-chamber enlargement and signs of coronary artery disease. 6. Aortic atherosclerosis. Aortic Atherosclerosis (ICD10-I70.0). Electronically Signed   By: Zetta Bills M.D.   On: 11/25/2022 17:04    ROS:  Pertinent items are noted in HPI.  Blood pressure (!) 143/76, pulse 72, temperature 98.2 F (36.8 C), temperature source Oral, resp. rate 16, height 5' 8"$  (1.727 m), weight 92.5 kg, SpO2 92 %. Physical Exam: Pleasant white female no acute distress Head is normocephalic, atraumatic Lungs clear to auscultation with equal breath sounds bilaterally Heart examination reveals an irregular rate and rhythm. Abdomen is soft and rotund.  She has multiple surgical scars present.  She does have active bowel sounds.  She is nontender.  She does have loss of domain of her abdominal wall.  CT scan images personally reviewed  Assessment/Plan: Impression: Partial small bowel obstruction, resolving.  There is no evidence of bowel compromise at the present time.  No need for acute surgical intervention at the present time.  The patient was told that she may need to be evaluated at a tertiary care center should she have recurrent episodes.  She understands and agrees. Plan: Will advance to full liquid diet.  Okay for discharge from surgery standpoint.  Discussed with Dr. Manuella Ghazi.  Aviva Signs 11/26/2022, 10:19 AM

## 2022-11-26 NOTE — Progress Notes (Signed)
Pt has had multiple runny BMs throughout night. She states that she feels much better. Pain 0/10. Abdomen still distended but not as taut.

## 2022-11-30 ENCOUNTER — Encounter (HOSPITAL_COMMUNITY): Payer: Self-pay

## 2022-11-30 ENCOUNTER — Emergency Department (HOSPITAL_COMMUNITY): Payer: Medicare PPO

## 2022-11-30 ENCOUNTER — Other Ambulatory Visit: Payer: Self-pay

## 2022-11-30 DIAGNOSIS — E876 Hypokalemia: Secondary | ICD-10-CM | POA: Insufficient documentation

## 2022-11-30 DIAGNOSIS — Z85828 Personal history of other malignant neoplasm of skin: Secondary | ICD-10-CM | POA: Diagnosis not present

## 2022-11-30 DIAGNOSIS — I10 Essential (primary) hypertension: Secondary | ICD-10-CM | POA: Diagnosis not present

## 2022-11-30 DIAGNOSIS — Z853 Personal history of malignant neoplasm of breast: Secondary | ICD-10-CM | POA: Insufficient documentation

## 2022-11-30 DIAGNOSIS — Z96641 Presence of right artificial hip joint: Secondary | ICD-10-CM | POA: Diagnosis not present

## 2022-11-30 DIAGNOSIS — E119 Type 2 diabetes mellitus without complications: Secondary | ICD-10-CM | POA: Diagnosis not present

## 2022-11-30 DIAGNOSIS — R079 Chest pain, unspecified: Secondary | ICD-10-CM | POA: Diagnosis not present

## 2022-11-30 DIAGNOSIS — E039 Hypothyroidism, unspecified: Secondary | ICD-10-CM | POA: Insufficient documentation

## 2022-11-30 NOTE — ED Triage Notes (Signed)
Pt arrived from home w c/o HTN accompanied with right arm pain and heart pounding. Pt states that she has been taking her bp meds as rx'd

## 2022-12-01 ENCOUNTER — Emergency Department (HOSPITAL_COMMUNITY)
Admission: EM | Admit: 2022-12-01 | Discharge: 2022-12-01 | Disposition: A | Discharge status: Home / Self Care | Payer: Medicare PPO | Attending: Emergency Medicine | Admitting: Emergency Medicine

## 2022-12-01 DIAGNOSIS — I10 Essential (primary) hypertension: Secondary | ICD-10-CM

## 2022-12-01 DIAGNOSIS — E876 Hypokalemia: Secondary | ICD-10-CM

## 2022-12-01 LAB — CBC
HCT: 42.7 % (ref 36.0–46.0)
Hemoglobin: 14.7 g/dL (ref 12.0–15.0)
MCH: 32.2 pg (ref 26.0–34.0)
MCHC: 34.4 g/dL (ref 30.0–36.0)
MCV: 93.4 fL (ref 80.0–100.0)
Platelets: 201 10*3/uL (ref 150–400)
RBC: 4.57 MIL/uL (ref 3.87–5.11)
RDW: 13.5 % (ref 11.5–15.5)
WBC: 5.5 10*3/uL (ref 4.0–10.5)
nRBC: 0 % (ref 0.0–0.2)

## 2022-12-01 LAB — BASIC METABOLIC PANEL
Anion gap: 15 (ref 5–15)
BUN: 11 mg/dL (ref 8–23)
CO2: 27 mmol/L (ref 22–32)
Calcium: 9.3 mg/dL (ref 8.9–10.3)
Chloride: 92 mmol/L — ABNORMAL LOW (ref 98–111)
Creatinine, Ser: 0.85 mg/dL (ref 0.44–1.00)
GFR, Estimated: >60 mL/min (ref >60)
Glucose, Bld: 117 mg/dL — ABNORMAL HIGH (ref 70–99)
Potassium: 2.7 mmol/L — CL (ref 3.5–5.1)
Sodium: 134 mmol/L — ABNORMAL LOW (ref 135–145)

## 2022-12-01 LAB — MAGNESIUM: Magnesium: 1.6 mg/dL — ABNORMAL LOW (ref 1.7–2.4)

## 2022-12-01 LAB — TROPONIN I (HIGH SENSITIVITY)
Troponin I (High Sensitivity): 9 ng/L (ref <18)
Troponin I (High Sensitivity): 9 ng/L (ref <18)

## 2022-12-01 MED ORDER — POTASSIUM CHLORIDE CRYS ER 20 MEQ PO TBCR: 20.0000 meq | EXTENDED_RELEASE_TABLET | Freq: Two times a day (BID) | ORAL | 0 refills | Status: DC

## 2022-12-01 MED ORDER — POTASSIUM CHLORIDE 10 MEQ/100ML IV SOLN
10.0000 meq | INTRAVENOUS | Status: AC
  Administered 2022-12-01 (×3): 10 meq via INTRAVENOUS
  Filled 2022-12-01 (×3): qty 100

## 2022-12-01 MED ORDER — MAGNESIUM SULFATE 2 GM/50ML IV SOLN
2.0000 g | Freq: Once | INTRAVENOUS | Status: AC
  Administered 2022-12-01: 2 g via INTRAVENOUS
  Filled 2022-12-01: qty 50

## 2022-12-01 MED ORDER — POTASSIUM CHLORIDE CRYS ER 20 MEQ PO TBCR
40.0000 meq | EXTENDED_RELEASE_TABLET | Freq: Once | ORAL | Status: AC
  Administered 2022-12-01: 40 meq via ORAL
  Filled 2022-12-01: qty 2

## 2022-12-01 NOTE — ED Provider Notes (Signed)
[facility] Emergency Department Provider Note MRN:  [id number]  Arrival date & time: 12/01/22     Chief Complaint   Hypertension   History of Present Illness   Sheila Blair is a 87 y.o. year-old female with a history of breast cancer, A-fib, small bowel obstruction presenting to the ED with chief complaint of hypertension.  Noticed blood pressure at home was [id number] systolic, which was concerning and she is here for evaluation.  Recent admission for small bowel obstruction.  No bowel movements today but still passing gas.  No abdominal pain, no chest pain, no shortness of breath.  Felt like her heart was beating hard.  Review of Systems  A thorough review of systems was obtained and all systems are negative except as noted in the HPI and PMH.   Patient's Health History    Past Medical History:  Diagnosis Date   Anxiety    Arthritis    Atrial fibrillation ([place])    Atypical nevus 09/21/2013   Mild-Left tricep   Breast cancer ([place])    BILATERAL    Difficult intubation    PT REPORTS NARROW AIRWAY BUT  HAS HAD 17 SURGERIES WITHOUT A PROBLEM - SURGERIES AT [facility] AND AT [facility] PER PT    Essential hypertension    Hypothyroidism    SCC (squamous cell carcinoma) 12/19/2019   right lower leg anterior-txpbx   SCC (squamous cell carcinoma) 11/12/2021   right arm   SCCA (squamous cell carcinoma) of skin 04/06/2017   RIGHT UPPER ARM-CX3,5FU   Sleep apnea    CPAP    Type 2 diabetes mellitus ([place])     Past Surgical History:  Procedure Laterality Date   ABDOMINAL HYSTERECTOMY     ABDOMINAL SURGERY     APPENDECTOMY     benign thyroid tumor     BREAST LUMPECTOMY Left 2008   BREAST LUMPECTOMY Right 2019   BREAST SURGERY     CATARACT EXTRACTION     CHOLECYSTECTOMY     COLONOSCOPY N/A 07/10/2016   Procedure: COLONOSCOPY;  Surgeon: Rogene Houston, MD;  Location: [facility];  Service: Endoscopy;  Laterality: N/A;  930 - moved to 11/2 @ 9:30   EYE SURGERY   2015   to correct drooping eye lids   HERNIA REPAIR     pseudoptosis Bilateral    rectocyle surgery     SMALL INTESTINE SURGERY     TOTAL HIP ARTHROPLASTY Right 12/05/2020   Procedure: TOTAL HIP ARTHROPLASTY ANTERIOR APPROACH;  Surgeon: Gaynelle Arabian, MD;  Location: [facility];  Service: Orthopedics;  Laterality: Right;  132mn   VEIN LIGATION Right     Family History  Problem Relation Age of Onset   Congestive Heart Failure Mother    Dementia Sister     Social History   Socioeconomic History   Marital status: Married    Spouse name: MSandrea Sankovich  Number of children: 2   Years of education: 12   Highest education level: 12th grade  Occupational History   Not on file  Tobacco Use   Smoking status: Never    Passive exposure: Never   Smokeless tobacco: Never  Vaping Use   Vaping Use: Never used  Substance and Sexual Activity   Alcohol use: No   Drug use: No   Sexual activity: Not Currently    Partners: Male  Other Topics Concern   Not on file  Social History Narrative   Not on file   Social  Determinants of Health   Financial Resource Strain: Low Risk  (08/13/2022)   Overall Financial Resource Strain (CARDIA)    Difficulty of Paying Living Expenses: Not hard at all  Food Insecurity: No Food Insecurity (11/25/2022)   Hunger Vital Sign    Worried About Running Out of Food in the Last Year: Never true    Ran Out of Food in the Last Year: Never true  Transportation Needs: No Transportation Needs (11/25/2022)   PRAPARE - Hydrologist (Medical): No    Lack of Transportation (Non-Medical): No  Physical Activity: Inactive (08/13/2022)   Exercise Vital Sign    Days of Exercise per Week: 0 days    Minutes of Exercise per Session: 0 min  Stress: No Stress Concern Present (08/13/2022)   [place]    Feeling of Stress : Not at all  Social Connections: [place] (08/13/2022)    Social Connection and Isolation Panel [NHANES]    Frequency of Communication with Friends and Family: More than three times a week    Frequency of Social Gatherings with Friends and Family: More than three times a week    Attends Religious Services: More than 4 times per year    Active Member of [employer] or Organizations: Yes    Attends Archivist Meetings: More than 4 times per year    Marital Status: Married  Human resources officer Violence: Not At Risk (11/25/2022)   Humiliation, Afraid, Rape, and Kick questionnaire    Fear of Current or Ex-Partner: No    Emotionally Abused: No    Physically Abused: No    Sexually Abused: No     Physical Exam   Vitals:   12/01/22 0435 12/01/22 0500  BP:    Pulse: 63 67  Resp: 16 20  Temp:    SpO2: 97% 92%    CONSTITUTIONAL: Well-appearing, NAD NEURO/PSYCH:  Alert and oriented x 3, no focal deficits EYES:  eyes equal and reactive ENT/NECK:  no LAD, no JVD CARDIO: Regular rate, well-perfused, normal S1 and S2 PULM:  CTAB no wheezing or rhonchi GI/GU:  non-distended, non-tender MSK/SPINE:  No gross deformities, no edema SKIN:  no rash, atraumatic   *Additional and/or pertinent findings included in MDM below  Diagnostic and Interventional Summary    EKG Interpretation  Date/Time:  Sunday November 30 2022 23:32:29 EST Ventricular Rate:  70 PR Interval:    QRS Duration: 90 QT Interval:  382 QTC Calculation: 412 R Axis:   85 Text Interpretation: Atrial fibrillation Nonspecific T wave abnormality Abnormal ECG When compared with ECG of 26-Nov-2020 11:35, Questionable change in QRS duration Criteria for Septal infarct are no longer Present Confirmed by Gerlene Fee [phone]) on 12/01/2022 1:34:30 AM       Labs Reviewed  BASIC METABOLIC PANEL - Abnormal; Notable for the following components:      Result Value   Sodium 134 (*)    Potassium 2.7 (*)    Chloride 92 (*)    Glucose, Bld 117 (*)    All other components within normal  limits  MAGNESIUM - Abnormal; Notable for the following components:   Magnesium 1.6 (*)    All other components within normal limits  CBC  TROPONIN I (HIGH SENSITIVITY)  TROPONIN I (HIGH SENSITIVITY)    DG Chest 2 View  Final Result      Medications  potassium chloride 10 mEq in 100 mL IVPB (10 mEq Intravenous New  Bag/Given 12/01/22 0435)  potassium chloride SA (KLOR-CON M) CR tablet 40 mEq (40 mEq Oral Given 12/01/22 0207)  magnesium sulfate IVPB 2 g 50 mL (0 g Intravenous Stopped 12/01/22 0434)     Procedures  /  Critical Care Procedures  ED Course and Medical Decision Making  Initial Impression and Ddx Hypertension at home with little to no concerning symptoms.  Felt like her heart was "pounding".  But no chest pain, no shortness of breath, no leg pain or swelling.  No abdominal pain.  Recent admission for SBO.  Remains hypertensive here in the emergency department.  Past medical/surgical history that increases complexity of ED encounter: A-fib, SBO  Interpretation of Diagnostics I personally reviewed the EKG and my interpretation is as follows: A-fib, largely unchanged from prior  Labs reveal hypokalemia, hypomagnesemia, otherwise no significant change from prior  Patient Reassessment and Ultimate Disposition/Management     Patient with electrolyte disturbance likely from recent SBO and dietary restriction.  Will replete.  Patient is motivated to be discharged and so will reassess after potassium is completed.  Patient management required discussion with the following services or consulting groups:  None  Complexity of Problems Addressed Acute illness or injury that poses threat of life of bodily function  Additional Data Reviewed and Analyzed Further history obtained from: Further history from spouse/family member  Additional Factors Impacting ED Encounter Risk Prescriptions and Consideration of hospitalization  Barth Kirks. [place] Small, [place] [email or web address]  Final Clinical Impressions(s) / ED Diagnoses     ICD-10-CM   1. Hypertension, unspecified type  I10     2. Hypokalemia  E87.6       ED Discharge Orders          Ordered    potassium chloride SA (KLOR-CON M) 20 MEQ tablet  2 times daily        12/01/22 0528             Discharge Instructions Discussed with and Provided to Patient:     Discharge Instructions      You were evaluated in the Emergency Department and after careful evaluation, we did not find any emergent condition requiring admission or further testing in the hospital.  Your exam/testing today was overall reassuring.  We found that your potassium levels are low.  Take the potassium pills at home as directed as well as your other medications.  Please return to the Emergency Department if you experience any worsening of your condition.  Thank you for allowing [place] to be a part of your care.        Maudie Flakes, MD 12/01/22 [phone]

## 2022-12-01 NOTE — Discharge Instructions (Signed)
You were evaluated in the Emergency Department and after careful evaluation, we did not find any emergent condition requiring admission or further testing in the hospital.  Your exam/testing today was overall reassuring.  We found that your potassium levels are low.  Take the potassium pills at home as directed as well as your other medications.  Please return to the Emergency Department if you experience any worsening of your condition.  Thank you for allowing [place] to be a part of your care.

## 2022-12-02 DIAGNOSIS — E876 Hypokalemia: Secondary | ICD-10-CM | POA: Diagnosis not present

## 2022-12-02 DIAGNOSIS — R7309 Other abnormal glucose: Secondary | ICD-10-CM | POA: Diagnosis not present

## 2022-12-02 DIAGNOSIS — I10 Essential (primary) hypertension: Secondary | ICD-10-CM | POA: Diagnosis not present

## 2022-12-02 DIAGNOSIS — K56600 Partial intestinal obstruction, unspecified as to cause: Secondary | ICD-10-CM | POA: Diagnosis not present

## 2022-12-02 DIAGNOSIS — E1122 Type 2 diabetes mellitus with diabetic chronic kidney disease: Secondary | ICD-10-CM | POA: Diagnosis not present

## 2022-12-04 ENCOUNTER — Other Ambulatory Visit: Payer: Self-pay | Admitting: Cardiology

## 2022-12-05 ENCOUNTER — Telehealth: Payer: Self-pay

## 2022-12-05 NOTE — Telephone Encounter (Signed)
     Patient  visit on 2/26  at [facility] you been able to follow up with your primary care physician? Yes   The patient was or was not able to obtain any needed medicine or equipment. Yes   Are there diet recommendations that you are having difficulty following? Na   Patient expresses understanding of discharge instructions and education provided has no other needs at this time.  Yes      [facility] [phone] [address], [place], [place] [address] Phone: [phone] Email: [email or web address]

## 2022-12-11 DIAGNOSIS — L570 Actinic keratosis: Secondary | ICD-10-CM | POA: Diagnosis not present

## 2022-12-11 DIAGNOSIS — D485 Neoplasm of uncertain behavior of skin: Secondary | ICD-10-CM | POA: Diagnosis not present

## 2022-12-11 DIAGNOSIS — C44722 Squamous cell carcinoma of skin of right lower limb, including hip: Secondary | ICD-10-CM | POA: Diagnosis not present

## 2022-12-11 DIAGNOSIS — M7981 Nontraumatic hematoma of soft tissue: Secondary | ICD-10-CM | POA: Diagnosis not present

## 2022-12-11 DIAGNOSIS — D045 Carcinoma in situ of skin of trunk: Secondary | ICD-10-CM | POA: Diagnosis not present

## 2022-12-11 DIAGNOSIS — Z85828 Personal history of other malignant neoplasm of skin: Secondary | ICD-10-CM | POA: Diagnosis not present

## 2022-12-22 DIAGNOSIS — E669 Obesity, unspecified: Secondary | ICD-10-CM | POA: Diagnosis not present

## 2022-12-22 DIAGNOSIS — Z8719 Personal history of other diseases of the digestive system: Secondary | ICD-10-CM | POA: Diagnosis not present

## 2022-12-22 DIAGNOSIS — K660 Peritoneal adhesions (postprocedural) (postinfection): Secondary | ICD-10-CM | POA: Diagnosis not present

## 2022-12-30 DIAGNOSIS — E785 Hyperlipidemia, unspecified: Secondary | ICD-10-CM | POA: Diagnosis not present

## 2022-12-30 DIAGNOSIS — Z79899 Other long term (current) drug therapy: Secondary | ICD-10-CM | POA: Diagnosis not present

## 2022-12-30 DIAGNOSIS — E1129 Type 2 diabetes mellitus with other diabetic kidney complication: Secondary | ICD-10-CM | POA: Diagnosis not present

## 2022-12-30 DIAGNOSIS — I4821 Permanent atrial fibrillation: Secondary | ICD-10-CM | POA: Diagnosis not present

## 2022-12-30 DIAGNOSIS — I10 Essential (primary) hypertension: Secondary | ICD-10-CM | POA: Diagnosis not present

## 2023-01-06 DIAGNOSIS — E876 Hypokalemia: Secondary | ICD-10-CM | POA: Diagnosis not present

## 2023-01-06 DIAGNOSIS — E1122 Type 2 diabetes mellitus with diabetic chronic kidney disease: Secondary | ICD-10-CM | POA: Diagnosis not present

## 2023-01-06 DIAGNOSIS — K56609 Unspecified intestinal obstruction, unspecified as to partial versus complete obstruction: Secondary | ICD-10-CM | POA: Diagnosis not present

## 2023-01-06 DIAGNOSIS — R7309 Other abnormal glucose: Secondary | ICD-10-CM | POA: Diagnosis not present

## 2023-01-18 DIAGNOSIS — G4733 Obstructive sleep apnea (adult) (pediatric): Secondary | ICD-10-CM | POA: Diagnosis not present

## 2023-01-19 DIAGNOSIS — H401133 Primary open-angle glaucoma, bilateral, severe stage: Secondary | ICD-10-CM | POA: Diagnosis not present

## 2023-01-27 DIAGNOSIS — I10 Essential (primary) hypertension: Secondary | ICD-10-CM | POA: Diagnosis not present

## 2023-01-27 DIAGNOSIS — I4821 Permanent atrial fibrillation: Secondary | ICD-10-CM | POA: Diagnosis not present

## 2023-02-07 ENCOUNTER — Other Ambulatory Visit: Payer: Self-pay | Admitting: Hematology and Oncology

## 2023-02-11 ENCOUNTER — Other Ambulatory Visit: Payer: Self-pay | Admitting: Hematology and Oncology

## 2023-02-17 ENCOUNTER — Ambulatory Visit (INDEPENDENT_AMBULATORY_CARE_PROVIDER_SITE_OTHER): Payer: Medicare PPO | Admitting: Pulmonary Disease

## 2023-02-17 ENCOUNTER — Encounter (HOSPITAL_BASED_OUTPATIENT_CLINIC_OR_DEPARTMENT_OTHER): Payer: Self-pay | Admitting: Pulmonary Disease

## 2023-02-17 VITALS — BP 154/82 | HR 77 | Temp 97.9°F | Ht 68.0 in | Wt 205.0 lb

## 2023-02-17 DIAGNOSIS — G4733 Obstructive sleep apnea (adult) (pediatric): Secondary | ICD-10-CM | POA: Diagnosis not present

## 2023-02-17 NOTE — Patient Instructions (Signed)
Will arrange for new Bipap machine with replacement mask and supplies.  Follow up in 4 months.

## 2023-02-17 NOTE — Progress Notes (Signed)
[facility]  Chief Complaint  Patient presents with   Follow-up    Consult. Patient is here to talk about her sleep.     Past Surgical History:  She  has a past surgical history that includes Breast surgery; Appendectomy; Cholecystectomy; Abdominal hysterectomy; Breast lumpectomy (Left, 2008); benign thyroid tumor; Vein ligation (Right); Abdominal surgery; Small intestine surgery; Hernia repair; Cataract extraction; rectocyle surgery; pseudoptosis (Bilateral); Eye surgery (2015); Colonoscopy (N/A, 07/10/2016); Total hip arthroplasty (Right, 12/05/2020); and Breast lumpectomy (Right, 2019).  Past Medical History:  Anxiety, OA, A fib, Breast cancer, HTN, Hypothyroidism, DM type 2, SBO, Glaucoma  Constitutional:  BP (!) 154/82 (BP Location: Left Arm, Patient Position: Sitting, Cuff Size: Normal)   Pulse 77   Temp 97.9 F (36.6 C) (Oral)   Ht 5\' 8"  (1.727 m)   Wt 205 lb (93 kg)   SpO2 94%   BMI 31.17 kg/m   Brief Summary:  Sheila Blair is a 87 y.o. female with obstructive sleep apnea.      Subjective:   She was previously seen by Dr. Fannie Knee.  She had a sleep study in September 2014 that showed severe obstructive sleep apnea.  She has been using Bipap.  She uses Bipap nightly.  She has a full face mask.  No issue with mask fit or pressure setting.  Not having sinus congestion or dry mouth.  She was using [place] for her DME, but had to switch to [place] because of insurance requirements.  She thinks her current machine was ordered in 2017.  She goes to sleep at 9 pm.  She falls asleep in minutes.  She wakes up 2 times to use the bathroom.  She gets out of bed at 8 am.  She feels rested in the morning.  She denies morning headache.  She does not use anything to help her fall sleep or stay awake.  She denies sleep walking, sleep talking, bruxism, or nightmares.  There is no history of restless legs.  She denies sleep  hallucinations, sleep paralysis, or cataplexy.  The Epworth score is 1 out of 24.   Physical Exam:   Appearance - well kempt   ENMT - no sinus tenderness, no oral exudate, no LAN, Mallampati 3 airway, no stridor  Respiratory - equal breath sounds bilaterally, no wheezing or rales  CV - irregular  Ext - no clubbing, no edema  Skin - no rashes  Psych - normal mood and affect   Sleep Tests:  PSG 06/19/13 >> AHI 76.1, SpO2 low 71% Bipap 11/19/22 to 02/16/23 >> used on 90 of 90 nights with average 10 hrs 30 min.  Average AHI 0.4 with Bipap 23/19 cm H2O  Cardiac Tests:  Echo 03/26/21 >> EF 60 to 65%, mild LVH, severe LA/RA dilation, mild MR, mild AR  Social History:  She  reports that she has never smoked. She has never been exposed to tobacco smoke. She has never used smokeless tobacco. She reports that she does not drink alcohol and does not use drugs.  Family History:  Her family history includes Congestive Heart Failure in her mother; Dementia in her sister.    Discussion:    Assessment/Plan:   Obstructive sleep apnea. - she is complaint with Bipap and reports benefit from therapy - she uses [facility] for her DME - her current device is more than 85 yrs old - will arrange for a new Resmed Bipap at 23/19 cm H2O with  new supplies  Permanent atrial fibrillation. - followed by Dr. Simona Huh with cardiology  Obesity. - discussed how weight can impact sleep and risk for sleep disordered breathing - discussed options to assist with weight loss: combination of diet modification, cardiovascular and strength training exercises  Cardiovascular risk. - had an extensive discussion regarding the adverse health consequences related to untreated sleep disordered breathing - specifically discussed the risks for hypertension, coronary artery disease, cardiac dysrhythmias, cerebrovascular disease, and diabetes - lifestyle modification discussed  Safe driving practices. - discussed how  sleep disruption can increase risk of accidents, particularly when driving - safe driving practices were discussed  Therapies for obstructive sleep apnea. - if the sleep study shows significant sleep apnea, then various therapies for treatment were reviewed: CPAP, oral appliance, and surgical interventions  Time Spent Involved in Patient Care on Day of Examination:  36 minutes  Follow up:   Patient Instructions  Will arrange for new Bipap machine with replacement mask and supplies.  Follow up in 4 months.  Medication List:   Allergies as of 02/17/2023       Reactions   Codeine Shortness Of Breath   Morphine And Related Shortness Of Breath        Medication List        Accurate as of Feb 17, 2023  2:07 PM. If you have any questions, ask your nurse or doctor.          acetaminophen 500 MG tablet Commonly known as: TYLENOL Take 1,000 mg by mouth every 6 (six) hours as needed for moderate pain.   anastrozole 1 MG tablet Commonly known as: ARIMIDEX TAKE 1 TABLET BY MOUTH DAILY.   atorvastatin 10 MG tablet Commonly known as: LIPITOR Take 10 mg by mouth at bedtime.   Biotin 1000 MCG tablet Take 1,000 mcg by mouth daily.   calcium carbonate 1250 (500 Ca) MG tablet Commonly known as: OS-CAL - dosed in mg of elemental calcium Take 1 tablet by mouth daily with breakfast.   cholecalciferol 25 MCG (1000 UNIT) tablet Commonly known as: VITAMIN D3 Take 1,000 Units by mouth daily.   citalopram 20 MG tablet Commonly known as: CELEXA Take 20 mg by mouth daily.   diltiazem 180 MG 24 hr capsule Commonly known as: CARDIZEM CD Take 180 mg by mouth daily.   docusate sodium 100 MG capsule Commonly known as: COLACE Take 100 mg by mouth 2 (two) times daily.   Eliquis 5 MG Tabs tablet Generic drug: apixaban Take 5 mg by mouth 2 (two) times daily.   hydrochlorothiazide 12.5 MG capsule Commonly known as: MICROZIDE Take 12.5 mg by mouth daily.   Ivermectin 1 %  Crea Apply to face daily for rosacea   latanoprost 0.005 % ophthalmic solution Commonly known as: XALATAN Place 1 drop into both eyes at bedtime.   metoprolol succinate 50 MG 24 hr tablet Commonly known as: TOPROL-XL TAKE 1 TABLET BY MOUTH DAILY WITH OR IMMEDIATELY FOLLOWING A MEAL.   olmesartan 5 MG tablet Commonly known as: BENICAR Take 5 mg by mouth at bedtime.   Polyethyl Glycol-Propyl Glycol 0.4-0.3 % Soln Place 1 drop into both eyes 2 (two) times daily as needed (dry eyes).   potassium chloride SA 20 MEQ tablet Commonly known as: KLOR-CON M Take 1 tablet (20 mEq total) by mouth 2 (two) times daily for 3 days.   traMADol 50 MG tablet Commonly known as: ULTRAM Take 50 mg by mouth as needed for moderate pain.  Signature:  Coralyn Helling, MD [facility] Pulmonary/Critical Care Pager - [phone] 02/17/2023, 2:07 PM

## 2023-02-18 DIAGNOSIS — L718 Other rosacea: Secondary | ICD-10-CM | POA: Diagnosis not present

## 2023-02-18 DIAGNOSIS — Z85828 Personal history of other malignant neoplasm of skin: Secondary | ICD-10-CM | POA: Diagnosis not present

## 2023-02-18 DIAGNOSIS — L910 Hypertrophic scar: Secondary | ICD-10-CM | POA: Diagnosis not present

## 2023-02-18 DIAGNOSIS — L570 Actinic keratosis: Secondary | ICD-10-CM | POA: Diagnosis not present

## 2023-02-18 DIAGNOSIS — L817 Pigmented purpuric dermatosis: Secondary | ICD-10-CM | POA: Diagnosis not present

## 2023-02-19 ENCOUNTER — Telehealth: Payer: Self-pay | Admitting: Internal Medicine

## 2023-02-19 NOTE — Telephone Encounter (Signed)
[facility] calling. Order for CPAP does not qualify due to age of unit PT will not qual until 2026.

## 2023-02-19 NOTE — Telephone Encounter (Signed)
ATC the patient. LVM for the patient to return the call.

## 2023-02-27 NOTE — Telephone Encounter (Signed)
Please try PT again one more time so I can close encounter. TY.

## 2023-03-03 NOTE — Telephone Encounter (Signed)
Called and spoke w/ pt spouse (ok per DPR) - he verbalized that pt is aware of current message regarding her CPAP replacement. NFN att.

## 2023-03-18 ENCOUNTER — Encounter: Payer: Self-pay | Admitting: Cardiology

## 2023-03-18 ENCOUNTER — Ambulatory Visit: Payer: Medicare PPO | Attending: Cardiology | Admitting: Cardiology

## 2023-03-18 VITALS — BP 150/90 | HR 72 | Ht 68.0 in | Wt 203.2 lb

## 2023-03-18 DIAGNOSIS — E782 Mixed hyperlipidemia: Secondary | ICD-10-CM | POA: Diagnosis not present

## 2023-03-18 DIAGNOSIS — I10 Essential (primary) hypertension: Secondary | ICD-10-CM | POA: Diagnosis not present

## 2023-03-18 DIAGNOSIS — I4821 Permanent atrial fibrillation: Secondary | ICD-10-CM

## 2023-03-18 MED ORDER — OLMESARTAN MEDOXOMIL 5 MG PO TABS: 10.0000 mg | ORAL_TABLET | Freq: Every day | ORAL | 1 refills | Status: DC

## 2023-03-18 NOTE — Progress Notes (Signed)
    Cardiology Office Note  Date: 03/18/2023   ID: ORALEE RAPAPORT, DOB October 01, 1936, MRN [id number]  History of Present Illness: Sheila Blair is an 87 y.o. female last seen in November 2023.  She is here for a follow-up visit.  Reports an occasional sense of palpitations in the morning, but symptoms resolved fairly quickly and are not bothersome for the rest of the day.  She does not report any exertional chest pain.  Stable NYHA class II dyspnea.  Does have some mild ankle edema when she is up on her feet a lot.  I reviewed her medications.  We discussed increasing Benicar to 10 mg daily for now.  She does not report any spontaneous bleeding problems on Eliquis.  I reviewed her lab work from earlier in the year.  Physical Exam: VS:  BP (!) 150/90   Pulse 72   Ht 5\' 8"  (1.727 m)   Wt 203 lb 3.2 oz (92.2 kg)   SpO2 94%   BMI 30.90 kg/m , BMI Body mass index is 30.9 kg/m.  Wt Readings from Last 3 Encounters:  03/18/23 203 lb 3.2 oz (92.2 kg)  02/17/23 205 lb (93 kg)  11/30/22 193 lb (87.5 kg)    General: Patient appears comfortable at rest. HEENT: Conjunctiva and lids normal. Neck: Supple, no elevated JVP or carotid bruits. Lungs: Clear to auscultation, nonlabored breathing at rest. Cardiac: Irregularly irregular without gallop, 1/6 systolic murmur. Extremities: No pitting edema.  ECG:  An ECG dated 11/30/2022 was personally reviewed today and demonstrated:  Fibrillation with nonspecific T wave changes.  Labwork: November 2023: Cholesterol 128, triglycerides 90, HDL 55, LDL 56 11/25/2022: ALT 28; AST 38 11/30/2022: BUN 11; Creatinine, Ser 0.85; Hemoglobin 14.7; Platelets 201; Potassium 2.7; Sodium 134 12/01/2022: Magnesium 1.10 December 2022: BUN 14, creatinine 0.81, potassium 3.7, hemoglobin A1c 5.8%  Other Studies Reviewed Today:  No interval cardiac testing for review today.  Assessment and Plan:  1.  Permanent atrial fibrillation with CHA2DS2-VASc score of 6.  LVEF 60 to  65% with severely dilated left atrium as of echocardiogram in June 2022.  No progressive symptoms, she continues on Toprol-XL, Cardizem CD, and Eliquis.  No spontaneous bleeding problems reported.  I reviewed her interval lab work.  2.  Essential hypertension.  Would increase Benicar to 10 mg daily for now, continue to track blood pressure at home and keep follow-up with Dr. Ouida Sills.  3.  Mixed hyperlipidemia.  LDL 56 in November 2023.  She continues on Lipitor.  Disposition:  Follow up  6 months.  Signed, Jonelle Sidle, M.D., F.A.C.C. [facility]

## 2023-03-18 NOTE — Patient Instructions (Addendum)
Medication Instructions:  Your physician has recommended you make the following change in your medication:  Increase benicar to 10 mg daily Continue all other medications the same  Labwork: none  Testing/Procedures: none  Follow-Up: Your physician recommends that you schedule a follow-up appointment in: 6 months  Any Other Special Instructions Will Be Listed Below (If Applicable).  If you need a refill on your cardiac medications before your next appointment, please call your pharmacy.

## 2023-03-25 DIAGNOSIS — E113292 Type 2 diabetes mellitus with mild nonproliferative diabetic retinopathy without macular edema, left eye: Secondary | ICD-10-CM | POA: Diagnosis not present

## 2023-03-25 DIAGNOSIS — H524 Presbyopia: Secondary | ICD-10-CM | POA: Diagnosis not present

## 2023-03-25 DIAGNOSIS — Z961 Presence of intraocular lens: Secondary | ICD-10-CM | POA: Diagnosis not present

## 2023-03-25 DIAGNOSIS — H52203 Unspecified astigmatism, bilateral: Secondary | ICD-10-CM | POA: Diagnosis not present

## 2023-03-25 DIAGNOSIS — E119 Type 2 diabetes mellitus without complications: Secondary | ICD-10-CM | POA: Diagnosis not present

## 2023-03-25 DIAGNOSIS — H401112 Primary open-angle glaucoma, right eye, moderate stage: Secondary | ICD-10-CM | POA: Diagnosis not present

## 2023-03-31 ENCOUNTER — Other Ambulatory Visit: Payer: Self-pay | Admitting: *Deleted

## 2023-03-31 DIAGNOSIS — C50911 Malignant neoplasm of unspecified site of right female breast: Secondary | ICD-10-CM

## 2023-04-06 ENCOUNTER — Ambulatory Visit: Payer: Medicare PPO | Admitting: Hematology and Oncology

## 2023-04-07 ENCOUNTER — Ambulatory Visit
Admission: RE | Admit: 2023-04-07 | Discharge: 2023-04-07 | Disposition: A | Discharge status: Home / Self Care | Payer: Medicare PPO | Source: Ambulatory Visit | Attending: Hematology and Oncology | Admitting: Hematology and Oncology

## 2023-04-07 DIAGNOSIS — C50911 Malignant neoplasm of unspecified site of right female breast: Secondary | ICD-10-CM

## 2023-04-07 DIAGNOSIS — Z1231 Encounter for screening mammogram for malignant neoplasm of breast: Secondary | ICD-10-CM | POA: Diagnosis not present

## 2023-04-18 NOTE — Progress Notes (Signed)
Patient Care Team: Carylon Perches, MD as PCP - General (Internal Medicine) Jonelle Sidle, MD as PCP - Cardiology (Cardiology) Janalyn Harder, MD (Inactive) as Consulting Physician (Dermatology) Karie Soda, MD as Consulting Physician (General Surgery) Malissa Hippo, MD (Inactive) as Consulting Physician (Gastroenterology) Serena Croissant, MD as Consulting Physician (Hematology and Oncology)  DIAGNOSIS:  Encounter Diagnoses  Name Primary?   Bilateral malignant neoplasm of breast in female, estrogen receptor positive, unspecified site of breast (HCC) Yes   Postmenopausal     SUMMARY OF ONCOLOGIC HISTORY: Oncology History  Bilateral breast cancer (HCC)  2009 Initial Diagnosis   Left breast cancer 2009: Grade 2 IDC 1.3 cm, triple negative, 1/5 lymph node micrometastases, adjuvant radiation, 2 cycles of Taxol and carboplatin discontinued for sepsis   2019 Relapse/Recurrence   Right breast cancer 07/2018: Status post lumpectomy 08/19/2018: Invasive papillary cancer 1.2 cm T1CN0 ER/PR positive HER-2 negative status post radiation completed 10/26/2018 [facility])   11/15/2018 -  Anti-estrogen oral therapy   Anastrozole     CHIEF COMPLIANT:  Follow-up on anastrozole   INTERVAL HISTORY: Sheila Blair is a 87 y.o with the above mentioned history of breast cancer currently on anastrozole therapy.  She presents to the clinic today for a follow-up. She reports that she is tolerating the anastrozole extremely well. She does have some joint stiffness in knees. Overall health is good. Denies any pain or discomfort in breast.    ALLERGIES:  is allergic to codeine and morphine and codeine.  MEDICATIONS:  Current Outpatient Medications  Medication Sig Dispense Refill   acetaminophen (TYLENOL) 500 MG tablet Take 1,000 mg by mouth every 6 (six) hours as needed for moderate pain.     atorvastatin (LIPITOR) 10 MG tablet Take 10 mg by mouth at bedtime.     Biotin 1000 MCG tablet Take  1,000 mcg by mouth daily.     calcium carbonate (OS-CAL - DOSED IN MG OF ELEMENTAL CALCIUM) 1250 (500 Ca) MG tablet Take 1 tablet by mouth daily with breakfast.     cholecalciferol (VITAMIN D3) 25 MCG (1000 UNIT) tablet Take 1,000 Units by mouth daily.     citalopram (CELEXA) 20 MG tablet Take 20 mg by mouth daily.     diltiazem (CARDIZEM CD) 180 MG 24 hr capsule Take 180 mg by mouth daily.     docusate sodium (COLACE) 100 MG capsule Take 100 mg by mouth 2 (two) times daily.     ELIQUIS 5 MG TABS tablet Take 5 mg by mouth 2 (two) times daily.     hydrochlorothiazide (MICROZIDE) 12.5 MG capsule Take 12.5 mg by mouth daily.     Ivermectin 1 % CREA Apply to face daily for rosacea 30 g 1   latanoprost (XALATAN) 0.005 % ophthalmic solution Place 1 drop into both eyes at bedtime.     metoprolol succinate (TOPROL-XL) 50 MG 24 hr tablet TAKE 1 TABLET BY MOUTH DAILY WITH OR IMMEDIATELY FOLLOWING A MEAL. 90 tablet 2   olmesartan (BENICAR) 5 MG tablet Take 2 tablets (10 mg total) by mouth at bedtime. 180 tablet 1   Polyethyl Glycol-Propyl Glycol 0.4-0.3 % SOLN Place 1 drop into both eyes 2 (two) times daily as needed (dry eyes).     traMADol (ULTRAM) 50 MG tablet Take 50 mg by mouth as needed for moderate pain.     anastrozole (ARIMIDEX) 1 MG tablet Take 1 tablet (1 mg total) by mouth daily. 90 tablet 1   potassium chloride SA (KLOR-CON  M) 20 MEQ tablet Take 1 tablet (20 mEq total) by mouth 2 (two) times daily for 3 days. 6 tablet 0   No current facility-administered medications for this visit.    PHYSICAL EXAMINATION: ECOG PERFORMANCE STATUS: 1 - Symptomatic but completely ambulatory  Vitals:   04/21/23 0759  BP: (!) 154/85  Pulse: 90  Resp: 18  Temp: (!) 97.2 F (36.2 C)  SpO2: 90%   Filed Weights   04/21/23 0759  Weight: 204 lb 14.4 oz (92.9 kg)    BREAST: No palpable masses or nodules in either right or left breasts. No palpable axillary supraclavicular or infraclavicular adenopathy no  breast tenderness or nipple discharge. (exam performed in the presence of a chaperone)  LABORATORY DATA:  I have reviewed the data as listed    Latest Ref Rng & Units 11/30/2022   11:50 PM 11/26/2022    4:23 AM 11/25/2022    2:14 PM  CMP  Glucose 70 - 99 mg/dL 098  119  147   BUN 8 - 23 mg/dL 11  16  13    Creatinine 0.44 - 1.00 mg/dL 8.29  5.62  1.30   Sodium 135 - 145 mmol/L 134  135  133   Potassium 3.5 - 5.1 mmol/L 2.7  3.4  3.2   Chloride 98 - 111 mmol/L 92  100  91   CO2 22 - 32 mmol/L 27  27  26    Calcium 8.9 - 10.3 mg/dL 9.3  8.1  9.4   Total Protein 6.5 - 8.1 g/dL   7.8   Total Bilirubin 0.3 - 1.2 mg/dL   1.7   Alkaline Phos 38 - 126 U/L   90   AST 15 - 41 U/L   38   ALT 0 - 44 U/L   28     Lab Results  Component Value Date   WBC 5.5 11/30/2022   HGB 14.7 11/30/2022   HCT 42.7 11/30/2022   MCV 93.4 11/30/2022   PLT 201 11/30/2022   NEUTROABS 6.9 11/25/2022    ASSESSMENT & PLAN:  Bilateral breast cancer (HCC) History of bilateral breast cancers Left breast cancer 2009: Grade 2 IDC 1.3 cm, triple negative, 1/5 lymph node micrometastases, adjuvant radiation, 2 cycles of Taxol and carboplatin discontinued for sepsis Right breast cancer 07/2018: Status post lumpectomy 08/19/2018: Invasive papillary cancer 1.2 cm T1CN0 ER/PR positive HER-2 negative status post radiation completed 10/26/2018   Current treatment: Anastrozole started 11/15/2018 Anastrozole toxicities: Benign Patient will finish 5 years of anastrozole therapy by February 2025.  At that time she will discontinue anastrozole therapy   Breast cancer surveillance: 1.  Breast exam 04/21/2019: Benign 2. Mammogram 04/08/2023: Benign breast density category C 3.  Bone density 12/01/2018: T score -1.4: Osteopenia   Recommend doing a bone density test within the next month and a telephone visit after that to discuss results Return to clinic in 1 year for follow-up    Orders Placed This Encounter  Procedures   DG  Bone Density    Standing Status:   Future    Standing Expiration Date:   04/20/2024    Order Specific Question:   Reason for Exam (SYMPTOM  OR DIAGNOSIS REQUIRED)    Answer:   postmenopausal    Order Specific Question:   Preferred imaging location?    Answer:   [facility]   The patient has a good understanding of the overall plan. she agrees with it. she will call with any problems that may  develop before the next visit here. Total time spent: 30 mins including face to face time and time spent for planning, charting and co-ordination of care   Tamsen Meek, MD 04/21/23    I Janan Ridge am acting as a Neurosurgeon for [employer]  I have reviewed the above documentation for accuracy and completeness, and I agree with the above.

## 2023-04-19 DIAGNOSIS — G4733 Obstructive sleep apnea (adult) (pediatric): Secondary | ICD-10-CM | POA: Diagnosis not present

## 2023-04-21 ENCOUNTER — Inpatient Hospital Stay: Payer: Medicare PPO | Attending: Hematology and Oncology | Admitting: Hematology and Oncology

## 2023-04-21 ENCOUNTER — Other Ambulatory Visit: Payer: Self-pay

## 2023-04-21 VITALS — BP 154/85 | HR 90 | Temp 97.2°F | Resp 18 | Ht 68.0 in | Wt 204.9 lb

## 2023-04-21 DIAGNOSIS — Z170 Estrogen receptor positive status [ER+]: Secondary | ICD-10-CM | POA: Insufficient documentation

## 2023-04-21 DIAGNOSIS — C50911 Malignant neoplasm of unspecified site of right female breast: Secondary | ICD-10-CM | POA: Diagnosis not present

## 2023-04-21 DIAGNOSIS — Z780 Asymptomatic menopausal state: Secondary | ICD-10-CM | POA: Diagnosis not present

## 2023-04-21 DIAGNOSIS — C50912 Malignant neoplasm of unspecified site of left female breast: Secondary | ICD-10-CM

## 2023-04-21 DIAGNOSIS — Z79811 Long term (current) use of aromatase inhibitors: Secondary | ICD-10-CM | POA: Insufficient documentation

## 2023-04-21 MED ORDER — ANASTROZOLE 1 MG PO TABS: 1.0000 mg | ORAL_TABLET | Freq: Every day | ORAL | 1 refills | Status: DC

## 2023-04-21 NOTE — Assessment & Plan Note (Signed)
History of bilateral breast cancers Left breast cancer 2009: Grade 2 IDC 1.3 cm, triple negative, 1/5 lymph node micrometastases, adjuvant radiation, 2 cycles of Taxol and carboplatin discontinued for sepsis Right breast cancer 07/2018: Status post lumpectomy 08/19/2018: Invasive papillary cancer 1.2 cm T1CN0 ER/PR positive HER-2 negative status post radiation completed 10/26/2018   Current treatment: Anastrozole started 11/15/2018 Anastrozole toxicities: Benign Patient will finish 5 years of anastrozole therapy by February 2025.  At that time she will discontinue anastrozole therapy   Breast cancer surveillance: 1.  Breast exam 04/21/2019: Benign 2. Mammogram 04/08/2023: Benign breast density category C 3.  Bone density 12/01/2018: T score -1.4: Osteopenia   Return to clinic in 1 year for follow-up

## 2023-04-27 ENCOUNTER — Telehealth: Payer: Self-pay | Admitting: Hematology and Oncology

## 2023-04-27 NOTE — Telephone Encounter (Signed)
Scheduled appointments per 7/16 los. Unable to leave a voicemail due to mailbox being full.

## 2023-05-01 DIAGNOSIS — E1129 Type 2 diabetes mellitus with other diabetic kidney complication: Secondary | ICD-10-CM | POA: Diagnosis not present

## 2023-05-01 DIAGNOSIS — Z79899 Other long term (current) drug therapy: Secondary | ICD-10-CM | POA: Diagnosis not present

## 2023-05-01 DIAGNOSIS — I4821 Permanent atrial fibrillation: Secondary | ICD-10-CM | POA: Diagnosis not present

## 2023-05-01 DIAGNOSIS — I10 Essential (primary) hypertension: Secondary | ICD-10-CM | POA: Diagnosis not present

## 2023-05-01 DIAGNOSIS — E876 Hypokalemia: Secondary | ICD-10-CM | POA: Diagnosis not present

## 2023-05-01 DIAGNOSIS — E875 Hyperkalemia: Secondary | ICD-10-CM | POA: Diagnosis not present

## 2023-05-05 ENCOUNTER — Other Ambulatory Visit (HOSPITAL_BASED_OUTPATIENT_CLINIC_OR_DEPARTMENT_OTHER): Payer: Medicare PPO

## 2023-05-08 DIAGNOSIS — M170 Bilateral primary osteoarthritis of knee: Secondary | ICD-10-CM | POA: Diagnosis not present

## 2023-05-08 DIAGNOSIS — M25551 Pain in right hip: Secondary | ICD-10-CM | POA: Diagnosis not present

## 2023-05-11 ENCOUNTER — Telehealth: Payer: Medicare PPO | Admitting: Hematology and Oncology

## 2023-05-11 DIAGNOSIS — I10 Essential (primary) hypertension: Secondary | ICD-10-CM | POA: Diagnosis not present

## 2023-05-11 DIAGNOSIS — I4821 Permanent atrial fibrillation: Secondary | ICD-10-CM | POA: Diagnosis not present

## 2023-05-11 DIAGNOSIS — E1122 Type 2 diabetes mellitus with diabetic chronic kidney disease: Secondary | ICD-10-CM | POA: Diagnosis not present

## 2023-05-14 ENCOUNTER — Other Ambulatory Visit (HOSPITAL_BASED_OUTPATIENT_CLINIC_OR_DEPARTMENT_OTHER): Payer: Medicare PPO

## 2023-05-15 DIAGNOSIS — M170 Bilateral primary osteoarthritis of knee: Secondary | ICD-10-CM | POA: Diagnosis not present

## 2023-05-22 DIAGNOSIS — M170 Bilateral primary osteoarthritis of knee: Secondary | ICD-10-CM | POA: Diagnosis not present

## 2023-05-28 ENCOUNTER — Ambulatory Visit (HOSPITAL_BASED_OUTPATIENT_CLINIC_OR_DEPARTMENT_OTHER)
Admission: RE | Admit: 2023-05-28 | Discharge: 2023-05-28 | Disposition: A | Discharge status: Home / Self Care | Payer: Medicare PPO | Source: Ambulatory Visit | Attending: Hematology and Oncology | Admitting: Hematology and Oncology

## 2023-05-28 DIAGNOSIS — C50912 Malignant neoplasm of unspecified site of left female breast: Secondary | ICD-10-CM | POA: Insufficient documentation

## 2023-05-28 DIAGNOSIS — M8589 Other specified disorders of bone density and structure, multiple sites: Secondary | ICD-10-CM | POA: Diagnosis not present

## 2023-05-28 DIAGNOSIS — C50911 Malignant neoplasm of unspecified site of right female breast: Secondary | ICD-10-CM | POA: Insufficient documentation

## 2023-05-28 DIAGNOSIS — Z170 Estrogen receptor positive status [ER+]: Secondary | ICD-10-CM | POA: Diagnosis not present

## 2023-05-28 DIAGNOSIS — Z780 Asymptomatic menopausal state: Secondary | ICD-10-CM | POA: Insufficient documentation

## 2023-05-29 DIAGNOSIS — M170 Bilateral primary osteoarthritis of knee: Secondary | ICD-10-CM | POA: Diagnosis not present

## 2023-06-17 DIAGNOSIS — M7061 Trochanteric bursitis, right hip: Secondary | ICD-10-CM | POA: Diagnosis not present

## 2023-06-22 DIAGNOSIS — I10 Essential (primary) hypertension: Secondary | ICD-10-CM | POA: Diagnosis not present

## 2023-06-22 DIAGNOSIS — N390 Urinary tract infection, site not specified: Secondary | ICD-10-CM | POA: Diagnosis not present

## 2023-06-22 DIAGNOSIS — N3000 Acute cystitis without hematuria: Secondary | ICD-10-CM | POA: Diagnosis not present

## 2023-07-20 DIAGNOSIS — G4733 Obstructive sleep apnea (adult) (pediatric): Secondary | ICD-10-CM | POA: Diagnosis not present

## 2023-09-22 ENCOUNTER — Encounter (HOSPITAL_COMMUNITY): Payer: Self-pay | Admitting: *Deleted

## 2023-09-22 ENCOUNTER — Emergency Department (HOSPITAL_COMMUNITY): Payer: Medicare PPO

## 2023-09-22 ENCOUNTER — Other Ambulatory Visit: Payer: Self-pay

## 2023-09-22 ENCOUNTER — Observation Stay (HOSPITAL_COMMUNITY)
Admission: EM | Admit: 2023-09-22 | Discharge: 2023-09-23 | Disposition: A | Discharge status: Home / Self Care | Payer: Medicare PPO | Attending: Internal Medicine | Admitting: Internal Medicine

## 2023-09-22 ENCOUNTER — Other Ambulatory Visit (HOSPITAL_COMMUNITY): Payer: Self-pay | Admitting: *Deleted

## 2023-09-22 DIAGNOSIS — E119 Type 2 diabetes mellitus without complications: Secondary | ICD-10-CM | POA: Diagnosis not present

## 2023-09-22 DIAGNOSIS — I509 Heart failure, unspecified: Secondary | ICD-10-CM

## 2023-09-22 DIAGNOSIS — E876 Hypokalemia: Secondary | ICD-10-CM | POA: Diagnosis not present

## 2023-09-22 DIAGNOSIS — I5033 Acute on chronic diastolic (congestive) heart failure: Principal | ICD-10-CM

## 2023-09-22 DIAGNOSIS — I480 Paroxysmal atrial fibrillation: Secondary | ICD-10-CM | POA: Insufficient documentation

## 2023-09-22 DIAGNOSIS — I110 Hypertensive heart disease with heart failure: Secondary | ICD-10-CM | POA: Diagnosis not present

## 2023-09-22 DIAGNOSIS — Z96641 Presence of right artificial hip joint: Secondary | ICD-10-CM | POA: Insufficient documentation

## 2023-09-22 DIAGNOSIS — R0602 Shortness of breath: Secondary | ICD-10-CM | POA: Diagnosis not present

## 2023-09-22 DIAGNOSIS — I482 Chronic atrial fibrillation, unspecified: Secondary | ICD-10-CM

## 2023-09-22 DIAGNOSIS — G4733 Obstructive sleep apnea (adult) (pediatric): Secondary | ICD-10-CM

## 2023-09-22 DIAGNOSIS — I10 Essential (primary) hypertension: Secondary | ICD-10-CM

## 2023-09-22 DIAGNOSIS — I5020 Unspecified systolic (congestive) heart failure: Principal | ICD-10-CM

## 2023-09-22 DIAGNOSIS — Z79899 Other long term (current) drug therapy: Secondary | ICD-10-CM | POA: Insufficient documentation

## 2023-09-22 DIAGNOSIS — Z853 Personal history of malignant neoplasm of breast: Secondary | ICD-10-CM | POA: Insufficient documentation

## 2023-09-22 DIAGNOSIS — J9601 Acute respiratory failure with hypoxia: Secondary | ICD-10-CM | POA: Insufficient documentation

## 2023-09-22 DIAGNOSIS — J9811 Atelectasis: Secondary | ICD-10-CM | POA: Diagnosis not present

## 2023-09-22 DIAGNOSIS — R918 Other nonspecific abnormal finding of lung field: Secondary | ICD-10-CM | POA: Insufficient documentation

## 2023-09-22 DIAGNOSIS — I517 Cardiomegaly: Secondary | ICD-10-CM | POA: Diagnosis not present

## 2023-09-22 DIAGNOSIS — I4891 Unspecified atrial fibrillation: Secondary | ICD-10-CM | POA: Diagnosis present

## 2023-09-22 DIAGNOSIS — E039 Hypothyroidism, unspecified: Secondary | ICD-10-CM | POA: Diagnosis not present

## 2023-09-22 DIAGNOSIS — Z85828 Personal history of other malignant neoplasm of skin: Secondary | ICD-10-CM | POA: Insufficient documentation

## 2023-09-22 LAB — COMPREHENSIVE METABOLIC PANEL
ALT: 19 U/L (ref 0–44)
AST: 23 U/L (ref 15–41)
Albumin: 3.8 g/dL (ref 3.5–5.0)
Alkaline Phosphatase: 72 U/L (ref 38–126)
Anion gap: 9 (ref 5–15)
BUN: 15 mg/dL (ref 8–23)
CO2: 26 mmol/L (ref 22–32)
Calcium: 8.9 mg/dL (ref 8.9–10.3)
Chloride: 101 mmol/L (ref 98–111)
Creatinine, Ser: 0.64 mg/dL (ref 0.44–1.00)
GFR, Estimated: >60 mL/min (ref >60)
Glucose, Bld: 103 mg/dL — ABNORMAL HIGH (ref 70–99)
Potassium: 3.1 mmol/L — ABNORMAL LOW (ref 3.5–5.1)
Sodium: 136 mmol/L (ref 135–145)
Total Bilirubin: 1.2 mg/dL — ABNORMAL HIGH (ref <1.2)
Total Protein: 6.9 g/dL (ref 6.5–8.1)

## 2023-09-22 LAB — CBC WITH DIFFERENTIAL/PLATELET
Abs Immature Granulocytes: 0.01 10*3/uL (ref 0.00–0.07)
Basophils Absolute: 0 10*3/uL (ref 0.0–0.1)
Basophils Relative: 0 %
Eosinophils Absolute: 0.1 10*3/uL (ref 0.0–0.5)
Eosinophils Relative: 1 %
HCT: 40.5 % (ref 36.0–46.0)
Hemoglobin: 13.3 g/dL (ref 12.0–15.0)
Immature Granulocytes: 0 %
Lymphocytes Relative: 14 %
Lymphs Abs: 0.7 10*3/uL (ref 0.7–4.0)
MCH: 30.8 pg (ref 26.0–34.0)
MCHC: 32.8 g/dL (ref 30.0–36.0)
MCV: 93.8 fL (ref 80.0–100.0)
Monocytes Absolute: 0.4 10*3/uL (ref 0.1–1.0)
Monocytes Relative: 7 %
Neutro Abs: 4.1 10*3/uL (ref 1.7–7.7)
Neutrophils Relative %: 78 %
Platelets: 173 10*3/uL (ref 150–400)
RBC: 4.32 MIL/uL (ref 3.87–5.11)
RDW: 14.3 % (ref 11.5–15.5)
WBC: 5.3 10*3/uL (ref 4.0–10.5)
nRBC: 0 % (ref 0.0–0.2)

## 2023-09-22 LAB — MAGNESIUM: Magnesium: 1.8 mg/dL (ref 1.7–2.4)

## 2023-09-22 LAB — TROPONIN I (HIGH SENSITIVITY)
Troponin I (High Sensitivity): 10 ng/L (ref <18)
Troponin I (High Sensitivity): 9 ng/L (ref <18)

## 2023-09-22 LAB — BRAIN NATRIURETIC PEPTIDE: B Natriuretic Peptide: 283 pg/mL — ABNORMAL HIGH (ref 0.0–100.0)

## 2023-09-22 LAB — D-DIMER, QUANTITATIVE: D-Dimer, Quant: <0.27 ug{FEU}/mL (ref 0.00–0.50)

## 2023-09-22 LAB — TSH: TSH: 4.463 u[IU]/mL (ref 0.350–4.500)

## 2023-09-22 MED ORDER — SODIUM CHLORIDE 0.9% FLUSH
3.0000 mL | Freq: Two times a day (BID) | INTRAVENOUS | Status: DC
  Administered 2023-09-22 – 2023-09-23 (×2): 3 mL via INTRAVENOUS

## 2023-09-22 MED ORDER — ONDANSETRON HCL 4 MG PO TABS: 4.0000 mg | ORAL_TABLET | Freq: Four times a day (QID) | ORAL | Status: DC | PRN

## 2023-09-22 MED ORDER — METOPROLOL SUCCINATE ER 50 MG PO TB24
50.0000 mg | ORAL_TABLET | Freq: Every day | ORAL | Status: DC
  Administered 2023-09-23: 50 mg via ORAL
  Filled 2023-09-22: qty 1

## 2023-09-22 MED ORDER — ONDANSETRON HCL 4 MG/2ML IJ SOLN
4.0000 mg | Freq: Four times a day (QID) | INTRAMUSCULAR | Status: DC | PRN
Start: 2023-09-22 — End: 2023-09-23

## 2023-09-22 MED ORDER — BISACODYL 10 MG RE SUPP: 10.0000 mg | Freq: Every day | RECTAL | Status: DC | PRN

## 2023-09-22 MED ORDER — SENNOSIDES-DOCUSATE SODIUM 8.6-50 MG PO TABS: 2.0000 | ORAL_TABLET | Freq: Every day | ORAL | Status: DC

## 2023-09-22 MED ORDER — TRAMADOL HCL 50 MG PO TABS
50.0000 mg | ORAL_TABLET | Freq: Two times a day (BID) | ORAL | Status: DC | PRN
  Filled 2023-09-22: qty 1

## 2023-09-22 MED ORDER — POTASSIUM CHLORIDE CRYS ER 20 MEQ PO TBCR
40.0000 meq | EXTENDED_RELEASE_TABLET | Freq: Every day | ORAL | Status: DC
  Administered 2023-09-22 – 2023-09-23 (×2): 40 meq via ORAL
  Filled 2023-09-22 (×2): qty 2

## 2023-09-22 MED ORDER — POTASSIUM CHLORIDE CRYS ER 20 MEQ PO TBCR
40.0000 meq | EXTENDED_RELEASE_TABLET | Freq: Once | ORAL | Status: AC
  Administered 2023-09-22: 40 meq via ORAL
  Filled 2023-09-22: qty 2

## 2023-09-22 MED ORDER — POLYETHYLENE GLYCOL 3350 17 G PO PACK
17.0000 g | PACK | Freq: Every day | ORAL | Status: DC | PRN
  Administered 2023-09-22: 17 g via ORAL
  Filled 2023-09-22: qty 1

## 2023-09-22 MED ORDER — CITALOPRAM HYDROBROMIDE 20 MG PO TABS
20.0000 mg | ORAL_TABLET | Freq: Every day | ORAL | Status: DC
Start: 2023-09-23 — End: 2023-09-23
  Administered 2023-09-23: 20 mg via ORAL
  Filled 2023-09-22: qty 1

## 2023-09-22 MED ORDER — IRBESARTAN 150 MG PO TABS
150.0000 mg | ORAL_TABLET | Freq: Every day | ORAL | Status: DC
  Administered 2023-09-22 – 2023-09-23 (×2): 150 mg via ORAL
  Filled 2023-09-22 (×2): qty 1

## 2023-09-22 MED ORDER — SODIUM CHLORIDE 0.9 % IV SOLN: INTRAVENOUS | Status: DC | PRN

## 2023-09-22 MED ORDER — POTASSIUM CHLORIDE 10 MEQ/100ML IV SOLN
10.0000 meq | Freq: Once | INTRAVENOUS | Status: AC
  Administered 2023-09-22: 10 meq via INTRAVENOUS
  Filled 2023-09-22: qty 100

## 2023-09-22 MED ORDER — POTASSIUM CHLORIDE CRYS ER 20 MEQ PO TBCR
20.0000 meq | EXTENDED_RELEASE_TABLET | Freq: Two times a day (BID) | ORAL | Status: DC
  Administered 2023-09-22 – 2023-09-23 (×2): 20 meq via ORAL
  Filled 2023-09-22 (×2): qty 1

## 2023-09-22 MED ORDER — LATANOPROST 0.005 % OP SOLN
1.0000 [drp] | Freq: Every day | OPHTHALMIC | Status: DC
Start: 2023-09-22 — End: 2023-09-23
  Administered 2023-09-22: 1 [drp] via OPHTHALMIC
  Filled 2023-09-22: qty 2.5

## 2023-09-22 MED ORDER — SODIUM CHLORIDE 0.9% FLUSH: 3.0000 mL | INTRAVENOUS | Status: DC | PRN

## 2023-09-22 MED ORDER — ACETAMINOPHEN 650 MG RE SUPP: 650.0000 mg | Freq: Four times a day (QID) | RECTAL | Status: DC | PRN

## 2023-09-22 MED ORDER — ATORVASTATIN CALCIUM 10 MG PO TABS
10.0000 mg | ORAL_TABLET | Freq: Every day | ORAL | Status: DC
  Administered 2023-09-22: 10 mg via ORAL
  Filled 2023-09-22: qty 1

## 2023-09-22 MED ORDER — TRAZODONE HCL 50 MG PO TABS
50.0000 mg | ORAL_TABLET | Freq: Every evening | ORAL | Status: DC | PRN
  Administered 2023-09-22: 50 mg via ORAL
  Filled 2023-09-22: qty 1

## 2023-09-22 MED ORDER — POLYVINYL ALCOHOL 1.4 % OP SOLN: 1.0000 [drp] | Freq: Two times a day (BID) | OPHTHALMIC | Status: DC | PRN

## 2023-09-22 MED ORDER — ORAL CARE MOUTH RINSE: 15.0000 mL | OROMUCOSAL | Status: DC | PRN

## 2023-09-22 MED ORDER — APIXABAN 5 MG PO TABS
5.0000 mg | ORAL_TABLET | Freq: Two times a day (BID) | ORAL | Status: DC
  Administered 2023-09-22 – 2023-09-23 (×2): 5 mg via ORAL
  Filled 2023-09-22 (×2): qty 1

## 2023-09-22 MED ORDER — DILTIAZEM HCL ER COATED BEADS 180 MG PO CP24
180.0000 mg | ORAL_CAPSULE | Freq: Every day | ORAL | Status: DC
Start: 2023-09-23 — End: 2023-09-23
  Administered 2023-09-23: 180 mg via ORAL
  Filled 2023-09-22: qty 1

## 2023-09-22 MED ORDER — FUROSEMIDE 10 MG/ML IJ SOLN
20.0000 mg | Freq: Two times a day (BID) | INTRAMUSCULAR | Status: DC
  Administered 2023-09-23: 20 mg via INTRAVENOUS
  Filled 2023-09-22: qty 2

## 2023-09-22 MED ORDER — ACETAMINOPHEN 325 MG PO TABS
650.0000 mg | ORAL_TABLET | Freq: Four times a day (QID) | ORAL | Status: DC | PRN
  Administered 2023-09-22: 650 mg via ORAL
  Filled 2023-09-22: qty 2

## 2023-09-22 MED ORDER — SODIUM CHLORIDE 0.9% FLUSH
3.0000 mL | Freq: Two times a day (BID) | INTRAVENOUS | Status: DC
  Administered 2023-09-23: 3 mL via INTRAVENOUS

## 2023-09-22 MED ORDER — HYDRALAZINE HCL 20 MG/ML IJ SOLN
10.0000 mg | Freq: Four times a day (QID) | INTRAMUSCULAR | Status: DC | PRN
  Administered 2023-09-22: 10 mg via INTRAVENOUS
  Filled 2023-09-22: qty 1

## 2023-09-22 MED ORDER — FUROSEMIDE 10 MG/ML IJ SOLN
40.0000 mg | Freq: Once | INTRAMUSCULAR | Status: AC
  Administered 2023-09-22: 40 mg via INTRAVENOUS
  Filled 2023-09-22: qty 4

## 2023-09-22 MED ORDER — ANASTROZOLE 1 MG PO TABS
1.0000 mg | ORAL_TABLET | Freq: Every day | ORAL | Status: DC
  Filled 2023-09-22 (×3): qty 1

## 2023-09-22 NOTE — ED Notes (Signed)
Pt's O2 kept dropping to 87%-88%, placed pt on 2L.

## 2023-09-22 NOTE — Progress Notes (Signed)
Patient brought home CPAP to hospital.  Patient is currently on her home unit.

## 2023-09-22 NOTE — ED Triage Notes (Signed)
Pt c/o increased BP readings for the last 3 weeks and states she called her PCP who told her to lie down  Pt c/o headache and states she can feel her heart pounding and states she just doesn't feel right

## 2023-09-22 NOTE — H&P (Signed)
Patient Demographics:    Sheila Blair, is a 87 y.o. female  MRN: [id number]   DOB - 1936/01/15  Admit Date - 09/22/2023  Outpatient Primary MD for the patient is Carylon Perches, MD   Assessment & Plan:   Assessment and Plan: past medical history relevant for hypertension, diastolic dysfunction CHF, pulmonary hypertension, OSA, hypothyroidism, history of breast cancer, atrial fibrillation and DM2--- presents to the ED with complaints of increasing dyspnea on exertion and persistent elevated blood pressure over the last 3 weeks or so, worse over the last week No fever  Or chills ,  No Nausea, Vomiting or Diarrhea -Patient also reports increasing palpitations and dizziness with A-fib -Additional history obtained from patient's daughter Pricilla Loveless at bedside --Chest x-ray with cardiomegaly atelectasis and -Troponin 10, repeat troponin is 9 -BNP elevated at 283, EKG is A-fib with heart rate in the 70s -D-dimer is less than 0.27 -Potassium of 3.1 , LFTs are not elevated , creatinine 0.64 -CBC WNL         1)Acute on chronic diastolic dysfunction CHF--- patient reports uncontrolled BP at home for the last 3 weeks with systolic BP in the 180s to 200s range  -patient presenting with increasing dyspnea especially with exertion, found to be hypoxic in the ED with elevated BNP --EDP  discussed case with on-call cardiologist Dr. Wyline Mood recommended IV diuresis and admission to the hospital -Ruled out for ACS by cardiac enzymes and EKG  Prior echo from June 2022 with EF of 50 to 55% at that time patient had no aortic stenosis no mitral stenosis she did have mild pulmonary hypertension with LVH -Patient received Lasix in the ED and voided quite a bit--- unfortunately urine output not accurately documented -Avoid overaggressive  diuresis -Daily weight, fluid input and output monitoring and Reds vest requested -Repeat echo requested  2) abnormal chest imaging finding---5 mm nodular opacity in the right lower lobe--repeat x-ray in 8 to 12 weeks advised -Patient denies prior or current tobacco use -Discussed with patient and her 2 daughters at bedside, they verbalized understanding  3) hypokalemia--likely related to HCTZ use -Magnesium is 1.8 -Replace potassium especially in view of A-fib and IV diuretic use  4) OSA--- CPAP nightly encouraged  5) chronic atrial fibrillation--- continue Eliquis for stroke prophylaxis, continue Cardizem and metoprolol for rate control  6)HTN--over the last 3 weeks patient blood pressure at home has been between  180-200 mmhg  systolic  -continue Cardizem, metoprolol, Avapro/olmesartan -BP meds need to be adjusted prior to discharge home for better control  7)History of breast cancer--- continue Arimidex  8) history of hypothyroidism--- not on thyroid medications at this time check TSH especially in view of underlying A-fib  9)DM2-A1c was previously 5.4 reflecting excellent diabetic control PT -This is usually diet controlled no routine diabetic medications - 10) acute hypoxic respiratory failure--- due to #1 above -In the ED O2 sats was 87 to 88% on room air patient was placed  on 2 L of oxygen via nasal cannula -Anticipate that hypoxia will resolve with diuresis  Dispo: The patient is from: Home              Anticipated d/c is to: Home              Anticipated d/c date is: 1 day              Patient currently is not medically stable to d/c. Barriers: Not Clinically Stable-   With History of - Reviewed by me  Past Medical History:  Diagnosis Date   Anxiety    Arthritis    Atrial fibrillation (HCC)    Atypical nevus 09/21/2013   Mild-Left tricep   Breast cancer (HCC)    BILATERAL    Difficult intubation    PT REPORTS NARROW AIRWAY BUT  HAS HAD 17 SURGERIES WITHOUT  A PROBLEM - SURGERIES AT [facility] AND AT [facility] PER PT    Essential hypertension    Hypothyroidism    SCC (squamous cell carcinoma) 12/19/2019   right lower leg anterior-txpbx   SCC (squamous cell carcinoma) 11/12/2021   right arm   SCCA (squamous cell carcinoma) of skin 04/06/2017   RIGHT UPPER ARM-CX3,5FU   Sleep apnea    CPAP    Type 2 diabetes mellitus (HCC)       Past Surgical History:  Procedure Laterality Date   ABDOMINAL HYSTERECTOMY     ABDOMINAL SURGERY     APPENDECTOMY     benign thyroid tumor     BREAST LUMPECTOMY Left 2008   BREAST LUMPECTOMY Right 2019   BREAST SURGERY     CATARACT EXTRACTION     CHOLECYSTECTOMY     COLONOSCOPY N/A 07/10/2016   Procedure: COLONOSCOPY;  Surgeon: Malissa Hippo, MD;  Location: [facility];  Service: Endoscopy;  Laterality: N/A;  930 - moved to 11/2 @ 9:30   EYE SURGERY  2015   to correct drooping eye lids   HERNIA REPAIR     pseudoptosis Bilateral    rectocyle surgery     SMALL INTESTINE SURGERY     TOTAL HIP ARTHROPLASTY Right 12/05/2020   Procedure: TOTAL HIP ARTHROPLASTY ANTERIOR APPROACH;  Surgeon: Ollen Gross, MD;  Location: [facility];  Service: Orthopedics;  Laterality: Right;    VEIN LIGATION Right     Chief Complaint  Patient presents with   Hypertension      HPI:    Sheila Blair  is a 87 y.o. female with past medical history relevant for hypertension, diastolic dysfunction CHF, pulmonary hypertension, OSA, hypothyroidism, history of breast cancer, atrial fibrillation and DM2--- presents to the ED with complaints of increasing dyspnea on exertion and persistent elevated blood pressure over the last 3 weeks or so, worse over the last week No fever  Or chills ,  No Nausea, Vomiting or Diarrhea -Patient also reports increasing palpitations and dizziness with A-fib -Additional history obtained from patient's daughter Pricilla Loveless at bedside -In the ED O2 sats was 87 to 88% on room air patient was placed on 2 L of  oxygen via nasal cannula -Chest x-ray with cardiomegaly atelectasis and 5 mm nodular opacity in the right lower lobe--repeat x-ray in 8 to 12 weeks advised -Troponin 10, repeat troponin is 9 -BNP elevated at 283, EKG is A-fib with heart rate in the 70s -D-dimer is less than 0.27 -Potassium of 3.1 , LFTs are not elevated , creatinine 0.64 -CBC WNL -EDP  discussed case with on-call  cardiologist Dr. Wyline Mood recommended IV diuresis and admission to the hospital   Review of systems:    In addition to the HPI above,   A full Review of  Systems was done, all other systems reviewed are negative except as noted above in HPI , .    Social History:  Reviewed by me    Social History   Tobacco Use   Smoking status: Never    Passive exposure: Never   Smokeless tobacco: Never  Substance Use Topics   Alcohol use: No     Family History :  Reviewed by me    Family History  Problem Relation Age of Onset   Congestive Heart Failure Mother    Dementia Sister     Home Medications:   Prior to Admission medications   Medication Sig Start Date End Date Taking? Authorizing Provider  anastrozole (ARIMIDEX) 1 MG tablet Take 1 tablet (1 mg total) by mouth daily. 04/21/23  Yes Serena Croissant, MD  atorvastatin (LIPITOR) 10 MG tablet Take 10 mg by mouth at bedtime. 05/30/13  Yes [provider]  Biotin 1000 MCG tablet Take 1,000 mcg by mouth daily.   Yes [provider]  calcium carbonate (OS-CAL - DOSED IN MG OF ELEMENTAL CALCIUM) 1250 (500 Ca) MG tablet Take 1 tablet by mouth daily with breakfast.   Yes [provider]  cholecalciferol (VITAMIN D3) 25 MCG (1000 UNIT) tablet Take 1,000 Units by mouth daily.   Yes [provider]  citalopram (CELEXA) 20 MG tablet Take 20 mg by mouth daily.   Yes [provider]  diltiazem (CARDIZEM CD) 180 MG 24 hr capsule Take 180 mg by mouth daily. 10/01/21  Yes [provider]  diphenhydramine-acetaminophen  (TYLENOL PM) 25-500 MG TABS tablet Take 1 tablet by mouth at bedtime.   Yes [provider]  ELIQUIS 5 MG TABS tablet Take 5 mg by mouth 2 (two) times daily. 08/20/22  Yes [provider]  hydrochlorothiazide (MICROZIDE) 12.5 MG capsule Take 12.5 mg by mouth daily. 12/02/21  Yes [provider]  latanoprost (XALATAN) 0.005 % ophthalmic solution Place 1 drop into both eyes at bedtime. 01/12/20  Yes [provider]  metoprolol succinate (TOPROL-XL) 50 MG 24 hr tablet TAKE 1 TABLET BY MOUTH DAILY WITH OR IMMEDIATELY FOLLOWING A MEAL. 12/04/22  Yes Jonelle Sidle, MD  olmesartan (BENICAR) 5 MG tablet Take 2 tablets (10 mg total) by mouth at bedtime. 03/18/23  Yes Jonelle Sidle, MD  Polyethyl Glycol-Propyl Glycol 0.4-0.3 % SOLN Place 1 drop into both eyes 2 (two) times daily as needed (dry eyes).   Yes [provider]  polyethylene glycol (MIRALAX / GLYCOLAX) 17 g packet Take 17 g by mouth at bedtime.   Yes [provider]  potassium chloride SA (KLOR-CON M) 20 MEQ tablet Take 1 tablet (20 mEq total) by mouth 2 (two) times daily for 3 days. Patient taking differently: Take 20 mEq by mouth daily. 12/01/22 09/22/23 Yes BeroElmer Sow, MD  traMADol (ULTRAM) 50 MG tablet Take 50 mg by mouth as needed for moderate pain.   Yes [provider]  docusate sodium (COLACE) 100 MG capsule Take 100 mg by mouth 2 (two) times daily.    [provider]     Allergies:     Allergies  Allergen Reactions   Codeine Shortness Of Breath and Other (See Comments)    Chest pain   Morphine And Codeine Shortness Of Breath  Physical Exam:   Vitals  Blood pressure (!) 124/103, pulse 78, temperature 98 F (36.7 C), temperature source Oral, resp. rate 18, height 5\' 8"  (1.727 m), weight 83.9 kg, SpO2 97%.  Physical Examination: General appearance - alert,  in no distress  Mental status - alert, oriented to person, place, and time,  Nose- [place]  2L/min Eyes - sclera anicteric Neck - supple, no JVD elevation , Chest - Fair air movement, faint bibasilar rales Heart - S1 and S2 normal, irregularly irregular Abdomen - soft, nontender, nondistended, +BS Neurological - screening mental status exam normal, neck supple without rigidity, cranial nerves II through XII intact, DTR's normal and symmetric Extremities - no significant pedal edema noted, intact peripheral pulses  Skin - warm, dry    Data Review:    CBC Recent Labs  Lab 09/22/23 1238  WBC 5.3  HGB 13.3  HCT 40.5  PLT 173  MCV 93.8  MCH 30.8  MCHC 32.8  RDW 14.3  LYMPHSABS 0.7  MONOABS 0.4  EOSABS 0.1  BASOSABS 0.0   ------------------------------------------------------------------------------------------------------------------  Chemistries  Recent Labs  Lab 09/22/23 1238 09/22/23 1502  NA 136  --   K 3.1*  --   CL 101  --   CO2 26  --   GLUCOSE 103*  --   BUN 15  --   CREATININE 0.64  --   CALCIUM 8.9  --   MG  --  1.8  AST 23  --   ALT 19  --   ALKPHOS 72  --   BILITOT 1.2*  --    ------------------------------------------------------------------------------------------------------------------ estimated creatinine clearance is 58.4 mL/min (by C-G formula based on SCr of 0.64 mg/dL). ------------------------------------------------------------------------------------------------------------------ ----------------------- Recent Labs    09/22/23 1238  DDIMER <0.27   ------------------------------------------------------------------------------------------------------------------------------------------------------------------------------------------------------------------------------------    Component Value Date/Time   BNP 283.0 (H) 09/22/2023 1238   Urinalysis    Component Value Date/Time   COLORURINE YELLOW 11/25/2022 1500   APPEARANCEUR CLEAR 11/25/2022 1500   LABSPEC 1.025 11/25/2022 1500   PHURINE 7.0 11/25/2022 1500   GLUCOSEU  NEGATIVE 11/25/2022 1500   HGBUR NEGATIVE 11/25/2022 1500   BILIRUBINUR SMALL (A) 11/25/2022 1500   KETONESUR 15 (A) 11/25/2022 1500   PROTEINUR 100 (A) 11/25/2022 1500   UROBILINOGEN 0.2 07/17/2013 0740   NITRITE NEGATIVE 11/25/2022 1500   LEUKOCYTESUR NEGATIVE 11/25/2022 1500     Imaging Results:    DG Chest Port 1 View Result Date: 09/22/2023 CLINICAL DATA:  Shortness of breath EXAM: PORTABLE CHEST - 1 VIEW COMPARISON:  11/30/2022 FINDINGS: Unchanged mild cardiomegaly. No pulmonary vascular congestion. New linear opacities in the left mid lung and right lung base favored to be atelectasis rather than pneumonia. There is a new nodular opacity in the right lung base measuring approximately 5 mm. IMPRESSION: 1. Mild unchanged cardiomegaly. 2. Mild bilateral atelectasis. 3. New 5 mm nodular opacity at the right lung base. Recommend follow-up chest radiograph in 8-12 weeks to document resolution. Electronically Signed   By: Acquanetta Belling M.D.   On: 09/22/2023 14:46   Radiological Exams on Admission: DG Chest Port 1 View Result Date: 09/22/2023 CLINICAL DATA:  Shortness of breath EXAM: PORTABLE CHEST - 1 VIEW COMPARISON:  11/30/2022 FINDINGS: Unchanged mild cardiomegaly. No pulmonary vascular congestion. New linear opacities in the left mid lung and right lung base favored to be atelectasis rather than pneumonia. There is a new nodular opacity in the right lung base measuring approximately 5 mm. IMPRESSION: 1. Mild unchanged cardiomegaly. 2. Mild bilateral  atelectasis. 3. New 5 mm nodular opacity at the right lung base. Recommend follow-up chest radiograph in 8-12 weeks to document resolution. Electronically Signed   By: Acquanetta Belling M.D.   On: 09/22/2023 14:46   DVT Prophylaxis -SCD/Eliquis AM Labs Ordered, also please review Full Orders  Family Communication: Admission, patients condition and plan of care including tests being ordered have been discussed with the patient and daughters x 2 at  bedside who indicate understanding and agree with the plan   Condition  -stable  Shon Hale M.D on 09/22/2023 at 6:15 PM Go to [email or web address] -  for contact info  [facility] - Office  [phone]

## 2023-09-22 NOTE — ED Provider Notes (Signed)
[place] EMERGENCY DEPARTMENT AT [facility] Provider Note   CSN: [id number] Arrival date & time: 09/22/23  4098     History {Add pertinent medical, surgical, social history, OB history to HPI:1} Chief Complaint  Patient presents with   Hypertension    Sheila Blair is a 87 y.o. female.  Patient has a history of atrial fibs and hypertension.  She states she has been short of breath for 2 weeks.  She also states that her blood pressure has been elevated.  No chest pain   Hypertension       Home Medications Prior to Admission medications   Medication Sig Start Date End Date Taking? Authorizing Provider  acetaminophen (TYLENOL) 500 MG tablet Take 1,000 mg by mouth every 6 (six) hours as needed for moderate pain.    [provider]  anastrozole (ARIMIDEX) 1 MG tablet Take 1 tablet (1 mg total) by mouth daily. 04/21/23   Serena Croissant, MD  atorvastatin (LIPITOR) 10 MG tablet Take 10 mg by mouth at bedtime. 05/30/13   [provider]  Biotin 1000 MCG tablet Take 1,000 mcg by mouth daily.    [provider]  calcium carbonate (OS-CAL - DOSED IN MG OF ELEMENTAL CALCIUM) 1250 (500 Ca) MG tablet Take 1 tablet by mouth daily with breakfast.    [provider]  cholecalciferol (VITAMIN D3) 25 MCG (1000 UNIT) tablet Take 1,000 Units by mouth daily.    [provider]  citalopram (CELEXA) 20 MG tablet Take 20 mg by mouth daily.    [provider]  diltiazem (CARDIZEM CD) 180 MG 24 hr capsule Take 180 mg by mouth daily. 10/01/21   [provider]  docusate sodium (COLACE) 100 MG capsule Take 100 mg by mouth 2 (two) times daily.    [provider]  ELIQUIS 5 MG TABS tablet Take 5 mg by mouth 2 (two) times daily. 08/20/22   [provider]  hydrochlorothiazide (MICROZIDE) 12.5 MG capsule Take 12.5 mg by mouth daily. 12/02/21   [provider]  Ivermectin 1 % CREA Apply to face daily for  rosacea 03/31/22   Janalyn Harder, MD  latanoprost (XALATAN) 0.005 % ophthalmic solution Place 1 drop into both eyes at bedtime. 01/12/20   [provider]  metoprolol succinate (TOPROL-XL) 50 MG 24 hr tablet TAKE 1 TABLET BY MOUTH DAILY WITH OR IMMEDIATELY FOLLOWING A MEAL. 12/04/22   Jonelle Sidle, MD  olmesartan (BENICAR) 5 MG tablet Take 2 tablets (10 mg total) by mouth at bedtime. 03/18/23   Jonelle Sidle, MD  Polyethyl Glycol-Propyl Glycol 0.4-0.3 % SOLN Place 1 drop into both eyes 2 (two) times daily as needed (dry eyes).    [provider]  potassium chloride SA (KLOR-CON M) 20 MEQ tablet Take 1 tablet (20 mEq total) by mouth 2 (two) times daily for 3 days. 12/01/22 03/18/23  Sabas Sous, MD  traMADol (ULTRAM) 50 MG tablet Take 50 mg by mouth as needed for moderate pain.    [provider]      Allergies    Codeine and Morphine and codeine    Review of Systems   Review of Systems  Physical Exam Updated Vital Signs BP (!) 164/94   Pulse 64   Temp 97.8 F (36.6 C) (Oral)   Resp 16   Ht 5\' 8"  (1.727 m)   Wt 83.9 kg   SpO2 94%   BMI 28.13 kg/m  Physical Exam  ED Results /  Procedures / Treatments   Labs (all labs ordered are listed, but only abnormal results are displayed) Labs Reviewed  COMPREHENSIVE METABOLIC PANEL - Abnormal; Notable for the following components:      Result Value   Potassium 3.1 (*)    Glucose, Bld 103 (*)    Total Bilirubin 1.2 (*)    All other components within normal limits  BRAIN NATRIURETIC PEPTIDE - Abnormal; Notable for the following components:   B Natriuretic Peptide 283.0 (*)    All other components within normal limits  CBC WITH DIFFERENTIAL/PLATELET  D-DIMER, QUANTITATIVE  MAGNESIUM  TROPONIN I (HIGH SENSITIVITY)  TROPONIN I (HIGH SENSITIVITY)    EKG EKG Interpretation Date/Time:  Tuesday September 22 2023 11:29:43 EST Ventricular Rate:  73 PR Interval:    QRS Duration:  84 QT  Interval:  422 QTC Calculation: 464 R Axis:   80  Text Interpretation: Atrial fibrillation Abnormal ECG When compared with ECG of 30-Nov-2022 23:32, Nonspecific T wave abnormality no longer evident in Inferior leads Nonspecific T wave abnormality no longer evident in Anterolateral leads QT has lengthened Confirmed by Bethann Berkshire [phone]) on 09/22/2023 2:35:56 PM  Radiology DG Chest Port 1 View Result Date: 09/22/2023 CLINICAL DATA:  Shortness of breath EXAM: PORTABLE CHEST - 1 VIEW COMPARISON:  11/30/2022 FINDINGS: Unchanged mild cardiomegaly. No pulmonary vascular congestion. New linear opacities in the left mid lung and right lung base favored to be atelectasis rather than pneumonia. There is a new nodular opacity in the right lung base measuring approximately 5 mm. IMPRESSION: 1. Mild unchanged cardiomegaly. 2. Mild bilateral atelectasis. 3. New 5 mm nodular opacity at the right lung base. Recommend follow-up chest radiograph in 8-12 weeks to document resolution. Electronically Signed   By: Acquanetta Belling M.D.   On: 09/22/2023 14:46    Procedures Procedures  {Document cardiac monitor, telemetry assessment procedure when appropriate:1}  Medications Ordered in ED Medications  potassium chloride 10 mEq in 100 mL IVPB (10 mEq Intravenous New Bag/Given 09/22/23 1500)  furosemide (LASIX) injection 40 mg (has no administration in time range)  potassium chloride SA (KLOR-CON M) CR tablet 40 mEq (40 mEq Oral Given 09/22/23 1458)    ED Course/ Medical Decision Making/ A&P   {Patient's O2 sat has been in the upper 80s without oxygen.  She has done well on 2 L. Click here for ABCD2, HEART and other calculatorsREFRESH Note before signing :1}                              Medical Decision Making Amount and/or Complexity of Data Reviewed Labs: ordered. Radiology: ordered. ECG/medicine tests: ordered.  Risk Prescription drug management. Decision regarding hospitalization.   Patient with mild  congestive heart failure and poorly controlled hypertension.  Patient is hypoxic.  She is given some Lasix and will be admitted to medicine with cardiology consulting  {Document critical care time when appropriate:1} {Document review of labs and clinical decision tools ie heart score, Chads2Vasc2 etc:1}  {Document your independent review of radiology images, and any outside records:1} {Document your discussion with family members, caretakers, and with consultants:1} {Document social determinants of health affecting pt's care:1} {Document your decision making why or why not admission, treatments were needed:1} Final Clinical Impression(s) / ED Diagnoses Final diagnoses:  Systolic congestive heart failure, unspecified HF chronicity (HCC)    Rx / DC Orders ED Discharge Orders     None

## 2023-09-23 ENCOUNTER — Other Ambulatory Visit (HOSPITAL_COMMUNITY): Payer: Self-pay | Admitting: *Deleted

## 2023-09-23 ENCOUNTER — Other Ambulatory Visit (HOSPITAL_COMMUNITY): Payer: Medicare PPO

## 2023-09-23 DIAGNOSIS — R0609 Other forms of dyspnea: Secondary | ICD-10-CM | POA: Diagnosis not present

## 2023-09-23 DIAGNOSIS — I5031 Acute diastolic (congestive) heart failure: Secondary | ICD-10-CM

## 2023-09-23 DIAGNOSIS — I50813 Acute on chronic right heart failure: Secondary | ICD-10-CM

## 2023-09-23 LAB — BASIC METABOLIC PANEL
Anion gap: 10 (ref 5–15)
BUN: 15 mg/dL (ref 8–23)
CO2: 28 mmol/L (ref 22–32)
Calcium: 9.1 mg/dL (ref 8.9–10.3)
Chloride: 100 mmol/L (ref 98–111)
Creatinine, Ser: 0.8 mg/dL (ref 0.44–1.00)
GFR, Estimated: >60 mL/min (ref >60)
Glucose, Bld: 118 mg/dL — ABNORMAL HIGH (ref 70–99)
Potassium: 3.7 mmol/L (ref 3.5–5.1)
Sodium: 138 mmol/L (ref 135–145)

## 2023-09-23 LAB — ECHOCARDIOGRAM COMPLETE
AR max vel: 2.33 cm2
AV Area VTI: 2.31 cm2
AV Area mean vel: 2.39 cm2
AV Mean grad: 3.1 mm[Hg]
AV Peak grad: 6.1 mm[Hg]
Ao pk vel: 1.23 m/s
Area-P 1/2: 5.97 cm2
Height: 68 in
MV M vel: 5.12 m/s
MV Peak grad: 104.9 mm[Hg]
P 1/2 time: 658 ms
Radius: 0.5 cm
S' Lateral: 3.3 cm
Weight: 3171.1 [oz_av]

## 2023-09-23 MED ORDER — FUROSEMIDE 20 MG PO TABS: 20.0000 mg | ORAL_TABLET | Freq: Every day | ORAL | 1 refills | Status: AC

## 2023-09-23 MED ORDER — POTASSIUM CHLORIDE CRYS ER 20 MEQ PO TBCR: 20.0000 meq | EXTENDED_RELEASE_TABLET | Freq: Every day | ORAL | 0 refills | Status: DC

## 2023-09-23 NOTE — Progress Notes (Signed)
Went over discharge instructions w/ pt.

## 2023-09-23 NOTE — TOC CM/SW Note (Signed)
Transition of Care [facility]) - Inpatient Brief Assessment   Patient Details  Name: [name] MRN: [id number] Date of Birth: [birth year]  Transition of Care [facility]) CM/SW Contact:    Villa Herb, LCSWA Phone Number: 09/23/2023, 11:30 AM   Clinical Narrative: Transition of Care Department [facility]) has reviewed patient and no TOC needs have been identified at this time. We will continue to monitor patient advancement through interdisciplinary progression rounds. If new patient transition needs arise, please place a TOC consult.  Transition of Care Asessment: Insurance and Status: Insurance coverage has been reviewed Patient has primary care physician: Yes Home environment has been reviewed: From home Prior level of function:: Independent Prior/Current Home Services: No current home services Social Drivers of Health Review: SDOH reviewed no interventions necessary Readmission risk has been reviewed: Yes Transition of care needs: no transition of care needs at this time

## 2023-09-23 NOTE — Discharge Summary (Signed)
Physician Discharge Summary  Sheila Blair ZOX:[id number] DOB: December 30, 1935 DOA: 09/22/2023  PCP: Sheila Perches, MD  Admit date: 09/22/2023 Discharge date: 09/23/2023  Admitted From: Home Disposition:  Home  Discharge Condition:Stable CODE STATUS:FULL Diet recommendation: Heart Healthy  Brief/Interim Summary: Patient is a 87 year old female with history of hypertension, diastolic CHF, pulmonary hypertension, OSA, hypothyroidism, breast cancer, chronic A-fib, diabetes type 2 who presented here with complaint of increasing shortness of breath, high blood pressure, palpitations, dizziness.  On presentation, chest x-ray showed cardiomegaly/atelectasis.  Lab work showed elevated BNP, potassium of 3.1.  EKG showed A-fib with heart rate in the range of 70s.  Admitted for the management of acute on chronic diastolic CHF, started on IV diuresis.  Cardiology following.  Currently she looks euvolemic.  Echo did not show any significant change from last 1.  Cardiology cleared for discharge.  Medically stable for discharge home today  Following problems were addressed during the hospitalization:  Acute on chronic HFpEF: Presented with worsening dyspnea, elevated BNP, hypoxia.  Last echo on June 2022 showed EF of 50 to 55%.  Started on IV diuresis. .   Cardiology was following.  New echocardiogram showed EF of 60 to 65%, indeterminate diastolic parameters.  She looks euvolemic today.  No lower extremity edema.  Lungs are clear to auscultation.  No dyspnea or cough.  She will be started on Lasix 20 mg daily.  She will follow-up with cardiology as an outpatient.   Chronic A-fib: Monitor on telemetry.  Currently rate is controlled.  On Eliquis for anticoagulation.  On Cardizem, metoprolol   Acute hypoxic respiratory failure: Initially required oxygen supplementation on admission.  Currently on room air.   Hypertension: Blood pressure stable/soft this morning.  Continue Cardizem, metoprolol, Avapro, olmesartan.   Continue to monitor blood pressure at home   History of breast cancer: On Arimidex   OSA: On CPAP   Diabetes type 2: Last A1c of 5.4.  Currently diet controlled   Abnormal chest imaging: Chest imaging showed 5 mm nodular opacity in the right lower lobe.  Repeat x-ray advised in 8 to 12 weeks.  No history of tobacco use.  Discharge Diagnoses:  Principal Problem:   Acute exacerbation of CHF (congestive heart failure) (HCC) Active Problems:   Hypertension   Obstructive sleep apnea   DM (diabetes mellitus) (HCC)   Atrial fibrillation [facility])    Discharge Instructions  Discharge Instructions     Diet - low sodium heart healthy   Complete by: As directed    Discharge instructions   Complete by: As directed    1)Please take prescribed medications as instructed 2)Follow up with your PCP in a week.  Do a BMP test to check your kidney function during the follow-up 3)Follow up with your cardiologist as an outpatient   Increase activity slowly   Complete by: As directed       Allergies as of 09/23/2023       Reactions   Codeine Shortness Of Breath, Other (See Comments)   Chest pain   Morphine And Codeine Shortness Of Breath        Medication List     STOP taking these medications    hydrochlorothiazide 12.5 MG capsule Commonly known as: MICROZIDE       TAKE these medications    anastrozole 1 MG tablet Commonly known as: ARIMIDEX Take 1 tablet (1 mg total) by mouth daily.   atorvastatin 10 MG tablet Commonly known as: LIPITOR Take 10 mg by mouth at  bedtime.   Biotin 1000 MCG tablet Take 1,000 mcg by mouth daily.   calcium carbonate 1250 (500 Ca) MG tablet Commonly known as: OS-CAL - dosed in mg of elemental calcium Take 1 tablet by mouth daily with breakfast.   cholecalciferol 25 MCG (1000 UNIT) tablet Commonly known as: VITAMIN D3 Take 1,000 Units by mouth daily.   citalopram 20 MG tablet Commonly known as: CELEXA Take 20 mg by mouth daily.    diltiazem 180 MG 24 hr capsule Commonly known as: CARDIZEM CD Take 180 mg by mouth daily.   diphenhydramine-acetaminophen 25-500 MG Tabs tablet Commonly known as: TYLENOL PM Take 1 tablet by mouth at bedtime.   docusate sodium 100 MG capsule Commonly known as: COLACE Take 100 mg by mouth 2 (two) times daily.   Eliquis 5 MG Tabs tablet Generic drug: apixaban Take 5 mg by mouth 2 (two) times daily.   furosemide 20 MG tablet Commonly known as: Lasix Take 1 tablet (20 mg total) by mouth daily. Start taking on: September 24, 2023   latanoprost 0.005 % ophthalmic solution Commonly known as: XALATAN Place 1 drop into both eyes at bedtime.   metoprolol succinate 50 MG 24 hr tablet Commonly known as: TOPROL-XL TAKE 1 TABLET BY MOUTH DAILY WITH OR IMMEDIATELY FOLLOWING A MEAL.   olmesartan 5 MG tablet Commonly known as: BENICAR Take 2 tablets (10 mg total) by mouth at bedtime.   Polyethyl Glycol-Propyl Glycol 0.4-0.3 % Soln Place 1 drop into both eyes 2 (two) times daily as needed (dry eyes).   polyethylene glycol 17 g packet Commonly known as: MIRALAX / GLYCOLAX Take 17 g by mouth at bedtime.   potassium chloride SA 20 MEQ tablet Commonly known as: KLOR-CON M Take 1 tablet (20 mEq total) by mouth daily for 7 days. Start taking on: September 24, 2023   traMADol 50 MG tablet Commonly known as: ULTRAM Take 50 mg by mouth as needed for moderate pain.        Follow-up Information     Sheila Perches, MD. Schedule an appointment as soon as possible for a visit in 1 week(s).   Specialty: Internal Medicine Contact information: [address] [place] [address] [phone]                Allergies  Allergen Reactions   Codeine Shortness Of Breath and Other (See Comments)    Chest pain   Morphine And Codeine Shortness Of Breath    Consultations: Cardiology   Procedures/Studies: ECHOCARDIOGRAM COMPLETE Result Date: 09/23/2023    ECHOCARDIOGRAM  REPORT   Patient Name:   Sheila Blair [facility] Date of Exam: 09/23/2023 Medical Rec #:  [id number]       Height:       68.0 in Accession #:    [id number]      Weight:       198.2 lb Date of Birth:  [birth year]        BSA:          2.036 m Patient Age:    87 years        BP:           107/86 mmHg Patient Gender: F               HR:           80 bpm. [facility] Procedure: 2D Echo, Cardiac Doppler and Color Doppler Indications:    Dyspnea R06.00  History:  Patient has prior history of Echocardiogram examinations, most                 recent 03/26/2021. CHF, Arrythmias:Atrial Fibrillation; Risk                 Factors:Hypertension, Diabetes and Dyslipidemia. Obstructive                 sleep apnea, Breast cancer (HCC) (From Hx).  Sonographer:    Celesta Gentile RCS Referring Phys: [phone] [employer] IMPRESSIONS  1. Left ventricular ejection fraction, by estimation, is 60 to 65%. The left ventricle has normal function. The left ventricle has no regional wall motion abnormalities. Left ventricular diastolic parameters are indeterminate.  2. Right ventricular systolic function is low normal. The right ventricular size is mildly enlarged. There is normal pulmonary artery systolic pressure.  3. Left atrial size was severely dilated.  4. Right atrial size was severely dilated.  5. The mitral valve is normal in structure. Trivial mitral valve regurgitation. No evidence of mitral stenosis.  6. The tricuspid valve is abnormal.  7. The aortic valve is tricuspid. Aortic valve regurgitation is mild. No aortic stenosis is present.  8. Aortic dilatation noted. There is mild dilatation of the ascending aorta, measuring 39 mm.  9. The inferior vena cava is normal in size with greater than 50% respiratory variability, suggesting right atrial pressure of 3 mmHg. FINDINGS  Left Ventricle: Left ventricular ejection fraction, by estimation, is 60 to 65%. The left ventricle has normal function. The left ventricle has no  regional wall motion abnormalities. The left ventricular internal cavity size was normal in size. There is  no left ventricular hypertrophy. Left ventricular diastolic parameters are indeterminate. Right Ventricle: The right ventricular size is mildly enlarged. Right vetricular wall thickness was not well visualized. Right ventricular systolic function is low normal. There is normal pulmonary artery systolic pressure. The tricuspid regurgitant velocity is 2.74 m/s, and with an assumed right atrial pressure of 3 mmHg, the estimated right ventricular systolic pressure is 33.0 mmHg. Left Atrium: Left atrial size was severely dilated. Right Atrium: Right atrial size was severely dilated. Pericardium: There is no evidence of pericardial effusion. Mitral Valve: The mitral valve is normal in structure. Trivial mitral valve regurgitation. No evidence of mitral valve stenosis. Tricuspid Valve: The tricuspid valve is abnormal. Tricuspid valve regurgitation is mild . No evidence of tricuspid stenosis. Aortic Valve: The aortic valve is tricuspid. Aortic valve regurgitation is mild. Aortic regurgitation PHT measures 658 msec. No aortic stenosis is present. Aortic valve mean gradient measures 3.1 mmHg. Aortic valve peak gradient measures 6.1 mmHg. Aortic  valve area, by VTI measures 2.31 cm. Pulmonic Valve: The pulmonic valve was not well visualized. Pulmonic valve regurgitation is not visualized. No evidence of pulmonic stenosis. Aorta: The aortic root is normal in size and structure and aortic dilatation noted. There is mild dilatation of the ascending aorta, measuring 39 mm. Venous: The inferior vena cava is normal in size with greater than 50% respiratory variability, suggesting right atrial pressure of 3 mmHg. IAS/Shunts: No atrial level shunt detected by color flow Doppler.  LEFT VENTRICLE PLAX 2D LVIDd:         5.30 cm   Diastology LVIDs:         3.30 cm   LV e' medial:    6.80 cm/s LV PW:         0.90 cm   LV E/e'  medial:  16.8 LV IVS:  0.90 cm   LV e' lateral:   12.80 cm/s LVOT diam:     2.00 cm   LV E/e' lateral: 8.9 LV SV:         52 LV SV Index:   25 LVOT Area:     3.14 cm  RIGHT VENTRICLE RV S prime:     10.00 cm/s TAPSE (M-mode): 1.5 cm LEFT ATRIUM              Index        RIGHT ATRIUM           Index LA diam:        5.40 cm  2.65 cm/m   RA Area:     33.00 cm LA Vol (A2C):   172.0 ml 84.48 ml/m  RA Volume:   116.00 ml 56.98 ml/m LA Vol (A4C):   187.0 ml 91.85 ml/m LA Biplane Vol: 188.0 ml 92.34 ml/m  AORTIC VALVE AV Area (Vmax):    2.33 cm AV Area (Vmean):   2.39 cm AV Area (VTI):     2.31 cm AV Vmax:           123.36 cm/s AV Vmean:          82.108 cm/s AV VTI:            0.223 m AV Peak Grad:      6.1 mmHg AV Mean Grad:      3.1 mmHg LVOT Vmax:         91.40 cm/s LVOT Vmean:        62.500 cm/s LVOT VTI:          0.164 m LVOT/AV VTI ratio: 0.74 AI PHT:            658 msec  AORTA Ao Root diam: 3.80 cm Ao Asc diam:  3.80 cm MITRAL VALVE                  TRICUSPID VALVE MV Area (PHT): 5.97 cm       TR Peak grad:   30.0 mmHg MV Decel Time: 127 msec       TR Vmax:        274.00 cm/s MR Peak grad:    104.9 mmHg MR Mean grad:    65.0 mmHg    SHUNTS MR Vmax:         512.00 cm/s  Systemic VTI:  0.16 m MR Vmean:        382.0 cm/s   Systemic Diam: 2.00 cm MR PISA:         1.57 cm MR PISA Eff ROA: 9 mm MR PISA Radius:  0.50 cm MV E velocity: 114.00 cm/s MV A velocity: 38.10 cm/s MV E/A ratio:  2.99 Dina Rich MD Electronically signed by Dina Rich MD Signature Date/Time: 09/23/2023/10:25:38 AM    Final    DG Chest Port 1 View Result Date: 09/22/2023 CLINICAL DATA:  Shortness of breath EXAM: PORTABLE CHEST - 1 VIEW COMPARISON:  11/30/2022 FINDINGS: Unchanged mild cardiomegaly. No pulmonary vascular congestion. New linear opacities in the left mid lung and right lung base favored to be atelectasis rather than pneumonia. There is a new nodular opacity in the right lung base measuring approximately 5  mm. IMPRESSION: 1. Mild unchanged cardiomegaly. 2. Mild bilateral atelectasis. 3. New 5 mm nodular opacity at the right lung base. Recommend follow-up chest radiograph in 8-12 weeks to document resolution. Electronically Signed   By: Acquanetta Belling M.D.   On:  09/22/2023 14:46      Subjective: Patient seen and examined at the bedside today.  She was ambulating on the hallway without any problem.  She was on room air.  Denies any shortness of breath or cough.  Very eager to go home.  I offered to call her husband to update about discharge planning, she said it is not necessary  Discharge Exam: Vitals:   09/22/23 2243 09/23/23 0540  BP: (!) 159/80 107/86  Pulse: 88 80  Resp:  18  Temp: 98.1 F (36.7 C) 98.2 F (36.8 C)  SpO2: 97% 95%   Vitals:   09/22/23 2008 09/22/23 2243 09/23/23 0500 09/23/23 0540  BP: (!) 174/109 (!) 159/80  107/86  Pulse: 74 88  80  Resp:    18  Temp: 98.2 F (36.8 C) 98.1 F (36.7 C)  98.2 F (36.8 C)  TempSrc: Oral Oral    SpO2: 91% 97%  95%  Weight:   89.9 kg   Height:        General: Pt is alert, awake, not in acute distress Cardiovascular: Irregularly irregular rhythm, S1/S2 +, no rubs, no gallops Respiratory: CTA bilaterally, no wheezing, no rhonchi Abdominal: Soft, NT, ND, bowel sounds + Extremities: no edema, no cyanosis    The results of significant diagnostics from this hospitalization (including imaging, microbiology, ancillary and laboratory) are listed below for reference.     Microbiology: No results found for this or any previous visit (from the past 240 hours).   Labs: BNP (last 3 results) Recent Labs    09/22/23 1238  BNP 283.0*   Basic Metabolic Panel: Recent Labs  Lab 09/22/23 1238 09/22/23 1502 09/23/23 0411  NA 136  --  138  K 3.1*  --  3.7  CL 101  --  100  CO2 26  --  28  GLUCOSE 103*  --  118*  BUN 15  --  15  CREATININE 0.64  --  0.80  CALCIUM 8.9  --  9.1  MG  --  1.8  --    Liver Function Tests: Recent  Labs  Lab 09/22/23 1238  AST 23  ALT 19  ALKPHOS 72  BILITOT 1.2*  PROT 6.9  ALBUMIN 3.8   No results for input(s): "LIPASE", "AMYLASE" in the last 168 hours. No results for input(s): "AMMONIA" in the last 168 hours. CBC: Recent Labs  Lab 09/22/23 1238  WBC 5.3  NEUTROABS 4.1  HGB 13.3  HCT 40.5  MCV 93.8  PLT 173   Cardiac Enzymes: No results for input(s): "CKTOTAL", "CKMB", "CKMBINDEX", "TROPONINI" in the last 168 hours. BNP: Invalid input(s): "POCBNP" CBG: No results for input(s): "GLUCAP" in the last 168 hours. D-Dimer Recent Labs    09/22/23 1238  DDIMER <0.27   Hgb A1c No results for input(s): "HGBA1C" in the last 72 hours. Lipid Profile No results for input(s): "CHOL", "HDL", "LDLCALC", "TRIG", "CHOLHDL", "LDLDIRECT" in the last 72 hours. Thyroid function studies Recent Labs    09/22/23 1238  TSH 4.463   Anemia work up No results for input(s): "VITAMINB12", "FOLATE", "FERRITIN", "TIBC", "IRON", "RETICCTPCT" in the last 72 hours. Urinalysis    Component Value Date/Time   COLORURINE YELLOW 11/25/2022 1500   APPEARANCEUR CLEAR 11/25/2022 1500   LABSPEC 1.025 11/25/2022 1500   PHURINE 7.0 11/25/2022 1500   GLUCOSEU NEGATIVE 11/25/2022 1500   HGBUR NEGATIVE 11/25/2022 1500   BILIRUBINUR SMALL (A) 11/25/2022 1500   KETONESUR 15 (A) 11/25/2022 1500   PROTEINUR 100 (A)  11/25/2022 1500   UROBILINOGEN 0.2 07/17/2013 0740   NITRITE NEGATIVE 11/25/2022 1500   LEUKOCYTESUR NEGATIVE 11/25/2022 1500   Sepsis Labs Recent Labs  Lab 09/22/23 1238  WBC 5.3   Microbiology No results found for this or any previous visit (from the past 240 hours).  Please note: You were cared for by a hospitalist during your hospital stay. Once you are discharged, your primary care physician will handle any further medical issues. Please note that NO REFILLS for any discharge medications will be authorized once you are discharged, as it is imperative that you return to your  primary care physician (or establish a relationship with a primary care physician if you do not have one) for your post hospital discharge needs so that they can reassess your need for medications and monitor your lab values.    Time coordinating discharge: 40 minutes  SIGNED:   Burnadette Pop, MD  [facility] 09/23/2023, 11:05 AM Pager [phone]  If 7PM-7AM, please contact night-coverage [email or web address] Password [email or web address]

## 2023-09-23 NOTE — Plan of Care (Signed)

## 2023-09-23 NOTE — Progress Notes (Addendum)
PT Screen/Discharge Note  Patient Details Name: Sheila Blair MRN: [id number] DOB: [birth year]   Cancelled Treatment:    Reason Eval/Treat Not Completed: PT screened, no needs identified, will sign off  PT entered room with MD and pt present. Per MD in room to sign off pt currently due to independent at baseline and pt is functioning at baseline currently.   PT asked pt similar questions and pt confirmed she is currently independent, has been up, making her bed. She drives, grocery shops and has no functional limitations currently. PT to sign off.   Sheila Blair PT, DPT [facility] [phone] office  Sheila Blair 09/23/2023, 10:32 AM

## 2023-09-23 NOTE — Plan of Care (Signed)
  Problem: Education: Goal: Knowledge of General Education information will improve Description: Including pain rating scale, medication(s)/side effects and non-pharmacologic comfort measures Outcome: Progressing   Problem: Clinical Measurements: Goal: Ability to maintain clinical measurements within normal limits will improve Outcome: Progressing   

## 2023-09-23 NOTE — Progress Notes (Signed)
Mobility Specialist Progress Note:    09/23/23 1048  Mobility  Activity Ambulated with assistance in hallway;Stood at bedside  Level of Assistance Modified independent, requires aide device or extra time  Assistive Device None  Distance Ambulated (ft) 80 ft  Range of Motion/Exercises Active;All extremities  Activity Response Tolerated well  Mobility Referral Yes  Mobility visit 1 Mobility  Mobility Specialist Start Time (ACUTE ONLY) 1015  Mobility Specialist Stop Time (ACUTE ONLY) 1030  Mobility Specialist Time Calculation (min) (ACUTE ONLY) 15 min   Pt received in room, agreeable to mobility. ModI to stand and ambulate with no AD. Tolerated well, SpO2 93% on RA at rest. SpO2 88% on RA during ambulation. Recovered quickly, SpO2 93% on RA at EOS. Left pt sitting EOB, all needs met.   Lawerance Bach Mobility Specialist Please contact via Special educational needs teacher or  Rehab office at [phone]

## 2023-09-23 NOTE — Progress Notes (Signed)
*  PRELIMINARY RESULTS* Echocardiogram 2D Echocardiogram has been performed.  Stacey Drain 09/23/2023, 10:13 AM

## 2023-09-23 NOTE — Consult Note (Addendum)
Cardiology Consultation   Patient ID: Sheila Blair MRN: [id number]; DOB: December 25, 1935  Admit date: 09/22/2023 Date of Consult: 09/23/2023  PCP:  Sheila Perches, MD   [facility] Providers Cardiologist:  Sheila Dell, MD   {      Patient Profile:   Sheila Blair is a 87 y.o. female with a hx of permanent afib, HTN, HLD, OSA, who is being seen 09/23/2023 for the evaluation of  SOB at the request of Dr Sheila Blair.  History of Present Illness:   Sheila Blair 87 yo female history of permanent afib, HTN, HLD, OSA, breast CA, presents with DOE, HTN, palpitations progressing over the last 3 weeks. Mild LE edema. Reports compliant with meds    ER vitals 171/108 p 71 94% WBC 5.3 Hgb 13.3 Plt 173 K 3.1 Cr 0.64 BUN 15 BNP 283 Ddimer neg TSH 4.4 Mg 1.8  Trop 10-->9 EKG afib 73 CXR mild cardiomegaly  03/2021 echo: LVE 60-65%, indet disatolic, normal RV, severe BAE, mild MR  Past Medical History:  Diagnosis Date   Anxiety    Arthritis    Atrial fibrillation (HCC)    Atypical nevus 09/21/2013   Mild-Left tricep   Breast cancer (HCC)    BILATERAL    Difficult intubation    PT REPORTS NARROW AIRWAY BUT  HAS HAD 17 SURGERIES WITHOUT A PROBLEM - SURGERIES AT [facility] AND AT [facility] PER PT    Essential hypertension    Hypothyroidism    SCC (squamous cell carcinoma) 12/19/2019   right lower leg anterior-txpbx   SCC (squamous cell carcinoma) 11/12/2021   right arm   SCCA (squamous cell carcinoma) of skin 04/06/2017   RIGHT UPPER ARM-CX3,5FU   Sleep apnea    CPAP    Type 2 diabetes mellitus (HCC)     Past Surgical History:  Procedure Laterality Date   ABDOMINAL HYSTERECTOMY     ABDOMINAL SURGERY     APPENDECTOMY     benign thyroid tumor     BREAST LUMPECTOMY Left 2008   BREAST LUMPECTOMY Right 2019   BREAST SURGERY     CATARACT EXTRACTION     CHOLECYSTECTOMY     COLONOSCOPY N/A 07/10/2016   Procedure: COLONOSCOPY;  Surgeon: Sheila Hippo, MD;  Location: [facility];  Service: Endoscopy;  Laterality: N/A;  930 - moved to 11/2 @ 9:30   EYE SURGERY  2015   to correct drooping eye lids   HERNIA REPAIR     pseudoptosis Bilateral    rectocyle surgery     SMALL INTESTINE SURGERY     TOTAL HIP ARTHROPLASTY Right 12/05/2020   Procedure: TOTAL HIP ARTHROPLASTY ANTERIOR APPROACH;  Surgeon: Sheila Gross, MD;  Location: [facility];  Service: Orthopedics;  Laterality: Right;    VEIN LIGATION Right       Inpatient Medications: Scheduled Meds:  anastrozole  1 mg Oral Daily   apixaban  5 mg Oral BID   atorvastatin  10 mg Oral QHS   citalopram  20 mg Oral Daily   diltiazem  180 mg Oral Daily   furosemide  20 mg Intravenous Q12H   irbesartan  150 mg Oral Daily   latanoprost  1 drop Both Eyes QHS   metoprolol succinate  50 mg Oral Daily   potassium chloride SA  20 mEq Oral BID   potassium chloride  40 mEq Oral Daily   senna-docusate  2 tablet Oral QHS   sodium chloride flush  3 mL Intravenous  Q12H   sodium chloride flush  3 mL Intravenous Q12H   Continuous Infusions:  sodium chloride     PRN Meds: sodium chloride, acetaminophen **OR** acetaminophen, bisacodyl, hydrALAZINE, ondansetron **OR** ondansetron (ZOFRAN) IV, mouth rinse, polyethylene glycol, polyvinyl alcohol, sodium chloride flush, traMADol, traZODone  Allergies:    Allergies  Allergen Reactions   Codeine Shortness Of Breath and Other (See Comments)    Chest pain   Morphine And Codeine Shortness Of Breath    Social History:   Social History   Socioeconomic History   Marital status: Married    Spouse name: Sheila Blair   Number of children: 2   Years of education: 12   Highest education level: 12th grade  Occupational History   Not on file  Tobacco Use   Smoking status: Never    Passive exposure: Never   Smokeless tobacco: Never  Vaping Use   Vaping status: Never Used  Substance and Sexual Activity   Alcohol use: No   Drug use: No   Sexual activity: Not  Currently    Partners: Male  Other Topics Concern   Not on file  Social History Narrative   Not on file   Social Drivers of Health   Financial Resource Strain: Low Risk  (08/13/2022)   Overall Financial Resource Strain (CARDIA)    Difficulty of Paying Living Expenses: Not hard at all  Food Insecurity: No Food Insecurity (09/22/2023)   Hunger Vital Sign    Worried About Running Out of Food in the Last Year: Never true    Ran Out of Food in the Last Year: Never true  Transportation Needs: No Transportation Needs (09/22/2023)   PRAPARE - Administrator, Civil Service (Medical): No    Lack of Transportation (Non-Medical): No  Physical Activity: Inactive (08/13/2022)   Exercise Vital Sign    Days of Exercise per Week: 0 days    Minutes of Exercise per Session: 0 min  Stress: No Stress Concern Present (08/13/2022)   [employer] of Occupational Health - Occupational Stress Questionnaire    Feeling of Stress : Not at all  Social Connections: Socially Integrated (08/13/2022)   Social Connection and Isolation Panel [NHANES]    Frequency of Communication with Friends and Family: More than three times a week    Frequency of Social Gatherings with Friends and Family: More than three times a week    Attends Religious Services: More than 4 times per year    Active Member of [employer] or Organizations: Yes    Attends Engineer, structural: More than 4 times per year    Marital Status: Married  Catering manager Violence: Not At Risk (09/22/2023)   Humiliation, Afraid, Rape, and Kick questionnaire    Fear of Current or Ex-Partner: No    Emotionally Abused: No    Physically Abused: No    Sexually Abused: No    Family History:    Family History  Problem Relation Age of Onset   Congestive Heart Failure Mother    Dementia Sister      ROS:  Please see the history of present illness.   All other ROS reviewed and negative.     Physical Exam/Data:   Vitals:   09/22/23  2008 09/22/23 2243 09/23/23 0500 09/23/23 0540  BP: (!) 174/109 (!) 159/80  107/86  Pulse: 74 88  80  Resp:    18  Temp: 98.2 F (36.8 C) 98.1 F (36.7 C)  98.2 F (36.8 C)  TempSrc: Oral Oral    SpO2: 91% 97%  95%  Weight:   89.9 kg   Height:        Intake/Output Summary (Last 24 hours) at 09/23/2023 0837 Last data filed at 09/23/2023 0500 Blair per 24 hour  Intake 240 ml  Output 1300 ml  Net -1060 ml      09/23/2023    5:00 AM 09/22/2023   11:26 AM 04/21/2023    7:59 AM  Last 3 Weights  Weight (lbs) 198 lb 3.1 oz 185 lb 204 lb 14.4 oz  Weight (kg) 89.9 kg 83.915 kg 92.942 kg     Body mass index is 30.14 kg/m.  General:  Well nourished, well developed, in no acute distress HEENT: normal Neck: +JVD Vascular: No carotid bruits; Distal pulses 2+ bilaterally Cardiac:  irreg Lungs:  clear to auscultation bilaterally, no wheezing, rhonchi or rales  Abd: soft, nontender, no hepatomegaly  Ext: no edema Musculoskeletal:  No deformities, BUE and BLE strength normal and equal Skin: warm and dry  Neuro:  CNs 2-12 intact, no focal abnormalities noted Psych:  Normal affect     Laboratory Data:  High Sensitivity Troponin:   Recent Labs  Lab 09/22/23 1238 09/22/23 1438  TROPONINIHS 10 9     Chemistry Recent Labs  Lab 09/22/23 1238 09/22/23 1502 09/23/23 0411  NA 136  --  138  K 3.1*  --  3.7  CL 101  --  100  CO2 26  --  28  GLUCOSE 103*  --  118*  BUN 15  --  15  CREATININE 0.64  --  0.80  CALCIUM 8.9  --  9.1  MG  --  1.8  --   GFRNONAA >60  --  >60  ANIONGAP 9  --  10    Recent Labs  Lab 09/22/23 1238  PROT 6.9  ALBUMIN 3.8  AST 23  ALT 19  ALKPHOS 72  BILITOT 1.2*   Lipids No results for input(s): "CHOL", "TRIG", "HDL", "LABVLDL", "LDLCALC", "CHOLHDL" in the last 168 hours.  Hematology Recent Labs  Lab 09/22/23 1238  WBC 5.3  RBC 4.32  HGB 13.3  HCT 40.5  MCV 93.8  MCH 30.8  MCHC 32.8  RDW 14.3  PLT 173   Thyroid  Recent Labs   Lab 09/22/23 1238  TSH 4.463    BNP Recent Labs  Lab 09/22/23 1238  BNP 283.0*    DDimer  Recent Labs  Lab 09/22/23 1238  DDIMER <0.27     Radiology/Studies:  DG Chest Port 1 View Result Date: 09/22/2023 CLINICAL DATA:  Shortness of breath EXAM: PORTABLE CHEST - 1 VIEW COMPARISON:  11/30/2022 FINDINGS: Unchanged mild cardiomegaly. No pulmonary vascular congestion. New linear opacities in the left mid lung and right lung base favored to be atelectasis rather than pneumonia. There is a new nodular opacity in the right lung base measuring approximately 5 mm. IMPRESSION: 1. Mild unchanged cardiomegaly. 2. Mild bilateral atelectasis. 3. New 5 mm nodular opacity at the right lung base. Recommend follow-up chest radiograph in 8-12 weeks to document resolution. Electronically Signed   By: Acquanetta Belling M.D.   On: 09/22/2023 14:46     Assessment and Plan:   1.Acute HFpEF - 03/2021 echo: LVE 60-65%, indet disatolic, normal RV, severe BAE, mild MR - indet diastolic function by echo but severe biatrial enlargement would support long standing significant diastolic dysfunction - CXR no acute process, BNP 283  - received IV lasix 40mg  in  ER, now on 20mg  IV bid. Limited I/Os. Weights appear inaccurate. Checking reds vest - continue IV diuresis. Slight uptrend in Cr but still WNL normal limits and prior range.  - f/u repeat echo, consider SGLT2i at follow up pending echo findings - symptoms much imrpoved overnight. F/u echo, reds vest. Possible d/c later today on oral diuretic regimen.    2. Permanent afib - on eliquis for stroke prevention, rate control with diltiazem 180mg  daily - tele shows rate controlled afib - continue current meds   3.HTN -elevated on admission, well controlled this AM - continue to monitor   For questions or updates, please contact [facility] Please consult [email or web address] for contact info under    Signed, Dina Rich, MD  09/23/2023 8:37  AM

## 2023-09-25 ENCOUNTER — Ambulatory Visit: Payer: Medicare PPO | Admitting: Cardiology

## 2023-10-06 ENCOUNTER — Other Ambulatory Visit: Payer: Self-pay | Admitting: Cardiology

## 2023-10-08 ENCOUNTER — Other Ambulatory Visit: Payer: Self-pay | Admitting: Cardiology

## 2023-10-08 DIAGNOSIS — I10 Essential (primary) hypertension: Secondary | ICD-10-CM | POA: Diagnosis not present

## 2023-10-08 DIAGNOSIS — I4821 Permanent atrial fibrillation: Secondary | ICD-10-CM | POA: Diagnosis not present

## 2023-10-08 DIAGNOSIS — I5030 Unspecified diastolic (congestive) heart failure: Secondary | ICD-10-CM | POA: Diagnosis not present

## 2023-10-15 ENCOUNTER — Other Ambulatory Visit: Payer: Self-pay | Admitting: Cardiology

## 2023-10-20 DIAGNOSIS — G4733 Obstructive sleep apnea (adult) (pediatric): Secondary | ICD-10-CM | POA: Diagnosis not present

## 2023-10-27 ENCOUNTER — Other Ambulatory Visit: Payer: Self-pay | Admitting: Hematology and Oncology

## 2023-10-30 DIAGNOSIS — M170 Bilateral primary osteoarthritis of knee: Secondary | ICD-10-CM | POA: Diagnosis not present

## 2023-10-30 DIAGNOSIS — M1712 Unilateral primary osteoarthritis, left knee: Secondary | ICD-10-CM | POA: Diagnosis not present

## 2023-10-30 DIAGNOSIS — M25551 Pain in right hip: Secondary | ICD-10-CM | POA: Diagnosis not present

## 2023-10-30 DIAGNOSIS — M1711 Unilateral primary osteoarthritis, right knee: Secondary | ICD-10-CM | POA: Diagnosis not present

## 2023-11-10 DIAGNOSIS — M170 Bilateral primary osteoarthritis of knee: Secondary | ICD-10-CM | POA: Diagnosis not present

## 2023-11-17 DIAGNOSIS — M170 Bilateral primary osteoarthritis of knee: Secondary | ICD-10-CM | POA: Diagnosis not present

## 2023-11-19 ENCOUNTER — Other Ambulatory Visit (HOSPITAL_COMMUNITY): Payer: Self-pay | Admitting: Internal Medicine

## 2023-11-19 DIAGNOSIS — R222 Localized swelling, mass and lump, trunk: Secondary | ICD-10-CM

## 2023-11-21 ENCOUNTER — Other Ambulatory Visit: Payer: Self-pay | Admitting: Cardiology

## 2023-11-24 DIAGNOSIS — M170 Bilateral primary osteoarthritis of knee: Secondary | ICD-10-CM | POA: Diagnosis not present

## 2023-12-04 ENCOUNTER — Ambulatory Visit (HOSPITAL_COMMUNITY)
Admission: RE | Admit: 2023-12-04 | Discharge: 2023-12-04 | Disposition: A | Discharge status: Home / Self Care | Payer: Medicare PPO | Source: Ambulatory Visit | Attending: Internal Medicine | Admitting: Internal Medicine

## 2023-12-04 DIAGNOSIS — R911 Solitary pulmonary nodule: Secondary | ICD-10-CM | POA: Diagnosis not present

## 2023-12-04 DIAGNOSIS — I517 Cardiomegaly: Secondary | ICD-10-CM | POA: Diagnosis not present

## 2023-12-04 DIAGNOSIS — R918 Other nonspecific abnormal finding of lung field: Secondary | ICD-10-CM | POA: Diagnosis not present

## 2023-12-04 DIAGNOSIS — R222 Localized swelling, mass and lump, trunk: Secondary | ICD-10-CM | POA: Insufficient documentation

## 2023-12-10 ENCOUNTER — Other Ambulatory Visit: Payer: Self-pay | Admitting: Hematology and Oncology

## 2023-12-28 ENCOUNTER — Telehealth: Payer: Self-pay | Admitting: Cardiology

## 2023-12-28 ENCOUNTER — Ambulatory Visit: Attending: Cardiology | Admitting: Cardiology

## 2023-12-28 ENCOUNTER — Encounter: Payer: Self-pay | Admitting: Cardiology

## 2023-12-28 VITALS — BP 144/90 | HR 77 | Ht 68.0 in | Wt 202.8 lb

## 2023-12-28 DIAGNOSIS — I4821 Permanent atrial fibrillation: Secondary | ICD-10-CM

## 2023-12-28 DIAGNOSIS — R0609 Other forms of dyspnea: Secondary | ICD-10-CM

## 2023-12-28 DIAGNOSIS — I10 Essential (primary) hypertension: Secondary | ICD-10-CM

## 2023-12-28 DIAGNOSIS — E782 Mixed hyperlipidemia: Secondary | ICD-10-CM

## 2023-12-28 MED ORDER — DAPAGLIFLOZIN PROPANEDIOL 10 MG PO TABS: 10.0000 mg | ORAL_TABLET | Freq: Every day | ORAL | 11 refills | Status: AC

## 2023-12-28 MED ORDER — DAPAGLIFLOZIN PROPANEDIOL 10 MG PO TABS: 10.0000 mg | ORAL_TABLET | Freq: Every day | ORAL | 0 refills | Status: DC

## 2023-12-28 NOTE — Telephone Encounter (Signed)
 Pt c/o Shortness Of Breath: STAT if SOB developed within the last 24 hours or pt is noticeably SOB on the phone  1. Are you currently SOB (can you hear that pt is SOB on the phone)?  Husband says patient isn't currently SOB   2. How long have you been experiencing SOB?  Occurs occasionally, every so often for the past few months. Saturday was the worst husband has seen + had panic attack   3. Are you SOB when sitting or when up moving around?  Both   4. Are you currently experiencing any other symptoms?  Right side of head hurting, pressure in neck, elevated BP 168/94 this morning

## 2023-12-28 NOTE — Telephone Encounter (Signed)
 I spoke with patient and offered her the available appointment today with Dr.McDowell at 1 pm, and she accepted.

## 2023-12-28 NOTE — Patient Instructions (Signed)
 Medication Instructions:   START Farxiga 10 mg daily for diastolic heart failure   All other medications stay the same  Labwork: None today  Testing/Procedures: Your physician has requested that you have a lexiscan myoview. For further information please visit [email or web address] Please follow instruction sheet, as given.   Follow-Up: 4-6 weeks   Any Other Special Instructions Will Be Listed Below (If Applicable).  If you need a refill on your cardiac medications before your next appointment, please call your pharmacy.

## 2023-12-28 NOTE — Progress Notes (Signed)
 Cardiology Office Note  Date: 12/28/2023   ID: Sheila Blair, DOB 1936/05/08, MRN [id number]  History of Present Illness: Sheila Blair is an 89 y.o. female last seen in June 2024.  She is here for a follow-up visit.  I reviewed the chart. She was hospitalized in December 2024, seen by cardiology in consultation to assist with management of HFpEF.  Updated echocardiogram from December 2024 is noted below.  She reports continued trouble with dyspnea on exertion, particular with walking stairs.  No chest pain but relative fatigue.  Leg swelling is generally better on Lasix, she is also on Benicar, Cardizem CD, and potassium supplement.  Weight has been relatively stable, she is up about 3 to 4 pounds recently.  Also reports fluctuating blood pressure with intermittent high readings such as 168/101, 167/106, and 158/98.  I reviewed her interval lab work and testing.  Physical Exam: VS:  BP (!) 152/98   Pulse 77   Ht 5\' 8"  (1.727 m)   Wt 202 lb 12.8 oz (92 kg)   SpO2 95%   BMI 30.84 kg/m , BMI Body mass index is 30.84 kg/m.  Wt Readings from Last 3 Encounters:  12/28/23 202 lb 12.8 oz (92 kg)  09/23/23 198 lb 3.1 oz (89.9 kg)  04/21/23 204 lb 14.4 oz (92.9 kg)    General: Patient appears comfortable at rest. HEENT: Conjunctiva and lids normal. Neck: Supple, no elevated JVP or carotid bruits. Lungs: Clear to auscultation, nonlabored breathing at rest. Cardiac: Irregularly irregular, no significant murmur or gallop. Extremities: Venous stasis with no pitting edema.  ECG:  An ECG dated 09/22/2023 was personally reviewed today and demonstrated:  Rate controlled atrial fibrillation.  Labwork: 09/22/2023: ALT 19; AST 23; B Natriuretic Peptide 283.0; Hemoglobin 13.3; Magnesium 1.8; Platelets 173; TSH 4.463 09/23/2023: BUN 15; Creatinine, Ser 0.80; Potassium 3.7; Sodium 138   Other Studies Reviewed Today:  Echocardiogram 09/23/2023:  1. Left ventricular ejection fraction, by  estimation, is 60 to 65%. The  left ventricle has normal function. The left ventricle has no regional  wall motion abnormalities. Left ventricular diastolic parameters are  indeterminate.   2. Right ventricular systolic function is low normal. The right  ventricular size is mildly enlarged. There is normal pulmonary artery  systolic pressure.   3. Left atrial size was severely dilated.   4. Right atrial size was severely dilated.   5. The mitral valve is normal in structure. Trivial mitral valve  regurgitation. No evidence of mitral stenosis.   6. The tricuspid valve is abnormal.   7. The aortic valve is tricuspid. Aortic valve regurgitation is mild. No  aortic stenosis is present.   8. Aortic dilatation noted. There is mild dilatation of the ascending  aorta, measuring 39 mm.   9. The inferior vena cava is normal in size with greater than 50%  respiratory variability, suggesting right atrial pressure of 3 mmHg.   Assessment and Plan:  1.  Dyspnea on exertion as discussed above.  HFpEF to be considered, however warrants ischemic testing as well.  No evidence of ACS during her hospital stay in December 2024 and LVEF normal without wall motion abnormalities.  Plan to add Farxiga 10 mg daily to current regimen which includes Benicar 10 mg in the evening and Lasix 20 mg daily.  We will obtain a Lexiscan Myoview and bring her back for further assessment.  1.  Permanent atrial fibrillation with CHA2DS2-VASc score of 6.  LVEF 60 to 65% and  left atrium severely dilated by echocardiogram in December 2024.  Heart rate controlled on Cardizem CD 180 mg daily and Toprol-XL 50 mg daily and she reports no palpitations.  Continue Eliquis 5 mg twice daily for stroke prophylaxis.   2.  Primary hypertension.  Fluctuating blood pressure.  Continue Benicar 10 mg daily for now.  Adding SGLT2 inhibitor as discussed above, can consider addition of Aldactone next.   3.  Mixed hyperlipidemia.  Continue Lipitor 10  mg daily.  Disposition:  Follow up  4 to 6 weeks.  Signed, Jonelle Sidle, M.D., F.A.C.C. [facility] at [facility]

## 2023-12-29 DIAGNOSIS — E1129 Type 2 diabetes mellitus with other diabetic kidney complication: Secondary | ICD-10-CM | POA: Diagnosis not present

## 2023-12-29 DIAGNOSIS — I10 Essential (primary) hypertension: Secondary | ICD-10-CM | POA: Diagnosis not present

## 2023-12-29 DIAGNOSIS — I480 Paroxysmal atrial fibrillation: Secondary | ICD-10-CM | POA: Diagnosis not present

## 2023-12-29 DIAGNOSIS — Z79899 Other long term (current) drug therapy: Secondary | ICD-10-CM | POA: Diagnosis not present

## 2024-01-02 ENCOUNTER — Ambulatory Visit
Admission: EM | Admit: 2024-01-02 | Discharge: 2024-01-02 | Disposition: A | Discharge status: Home / Self Care | Attending: Nurse Practitioner | Admitting: Nurse Practitioner

## 2024-01-02 DIAGNOSIS — U071 COVID-19: Secondary | ICD-10-CM | POA: Diagnosis not present

## 2024-01-02 DIAGNOSIS — Z20822 Contact with and (suspected) exposure to covid-19: Secondary | ICD-10-CM

## 2024-01-02 MED ORDER — FLUTICASONE PROPIONATE 50 MCG/ACT NA SUSP: 2.0000 | Freq: Every day | NASAL | 0 refills | Status: DC

## 2024-01-02 MED ORDER — PAXLOVID (300/100) 20 X 150 MG & 10 X 100MG PO TBPK: 3.0000 | ORAL_TABLET | Freq: Two times a day (BID) | ORAL | 0 refills | Status: AC

## 2024-01-02 MED ORDER — BENZONATATE 100 MG PO CAPS: 100.0000 mg | ORAL_CAPSULE | Freq: Three times a day (TID) | ORAL | 0 refills | Status: DC | PRN

## 2024-01-02 NOTE — ED Provider Notes (Signed)
 [facility]    CSN: [id number] Arrival date & time: 01/02/24  1407      History   Chief Complaint No chief complaint on file.   HPI Sheila Blair is a 88 y.o. female.   The history is provided by the patient.   Patient presents after a positive self-administered home COVID test earlier today.  She complains of generalized bodyaches, and headache.  States that her husband has recently had COVID.  She denies fever, chills, ear pain, sore throat, wheezing, difficulty breathing, chest pain, abdominal pain, nausea, vomiting, diarrhea, or rash.  States she she has been taking Mucinex for her symptoms.  Patient is requesting antiviral treatment.  Past Medical History:  Diagnosis Date   Anxiety    Arthritis    Atrial fibrillation (HCC)    Atypical nevus 09/21/2013   Mild-Left tricep   Breast cancer (HCC)    BILATERAL    Difficult intubation    PT REPORTS NARROW AIRWAY BUT  HAS HAD 17 SURGERIES WITHOUT A PROBLEM - SURGERIES AT [facility] AND AT [facility] PER PT    Essential hypertension    Hypothyroidism    SCC (squamous cell carcinoma) 12/19/2019   right lower leg anterior-txpbx   SCC (squamous cell carcinoma) 11/12/2021   right arm   SCCA (squamous cell carcinoma) of skin 04/06/2017   RIGHT UPPER ARM-CX3,5FU   Sleep apnea    CPAP    Type 2 diabetes mellitus [facility])     Patient Active Problem List   Diagnosis Date Noted   Acute exacerbation of CHF (congestive heart failure) (HCC) 09/22/2023   DM (diabetes mellitus) (HCC) 11/25/2022   SBO (small bowel obstruction) (HCC) 11/25/2022   Hypokalemia 11/25/2022   Atrial fibrillation (HCC) 11/25/2022   OA (osteoarthritis) of hip 12/05/2020   Osteoarthritis of right hip 12/05/2020   Bilateral breast cancer (HCC) 07/30/2020   Obstructive sleep apnea 07/19/2015   Hypercholesterolemia 09/08/2013   Hypertension 09/08/2013    Past Surgical History:  Procedure Laterality Date   ABDOMINAL HYSTERECTOMY     ABDOMINAL SURGERY      APPENDECTOMY     benign thyroid tumor     BREAST LUMPECTOMY Left 2008   BREAST LUMPECTOMY Right 2019   BREAST SURGERY     CATARACT EXTRACTION     CHOLECYSTECTOMY     COLONOSCOPY N/A 07/10/2016   Procedure: COLONOSCOPY;  Surgeon: Malissa Hippo, MD;  Location: [facility];  Service: Endoscopy;  Laterality: N/A;  930 - moved to 11/2 @ 9:30   EYE SURGERY  2015   to correct drooping eye lids   HERNIA REPAIR     pseudoptosis Bilateral    rectocyle surgery     SMALL INTESTINE SURGERY     TOTAL HIP ARTHROPLASTY Right 12/05/2020   Procedure: TOTAL HIP ARTHROPLASTY ANTERIOR APPROACH;  Surgeon: Ollen Gross, MD;  Location: [facility];  Service: Orthopedics;  Laterality: Right;    VEIN LIGATION Right     OB History   No obstetric history on file.      Home Medications    Prior to Admission medications   Medication Sig Start Date End Date Taking? Authorizing Provider  anastrozole (ARIMIDEX) 1 MG tablet TAKE 1 TABLET BY MOUTH DAILY. 12/10/23   Serena Croissant, MD  atorvastatin (LIPITOR) 10 MG tablet Take 10 mg by mouth at bedtime. 05/30/13   [provider]  Biotin 1000 MCG tablet Take 1,000 mcg by mouth daily.    [provider]  calcium carbonate (OS-CAL - DOSED IN MG OF ELEMENTAL CALCIUM) 1250 (500 Ca) MG tablet Take 1 tablet by mouth daily with breakfast.    [provider]  cholecalciferol (VITAMIN D3) 25 MCG (1000 UNIT) tablet Take 1,000 Units by mouth daily.    [provider]  citalopram (CELEXA) 20 MG tablet Take 20 mg by mouth daily.    [provider]  dapagliflozin propanediol (FARXIGA) 10 MG TABS tablet Take 1 tablet (10 mg total) by mouth daily before breakfast. 12/28/23   Jonelle Sidle, MD  dapagliflozin propanediol (FARXIGA) 10 MG TABS tablet Take 1 tablet (10 mg total) by mouth daily before breakfast. 12/28/23   Jonelle Sidle, MD  diltiazem (CARDIZEM CD) 180 MG 24 hr capsule Take 180 mg by mouth daily. 10/01/21    [provider]  diphenhydramine-acetaminophen (TYLENOL PM) 25-500 MG TABS tablet Take 1 tablet by mouth at bedtime.    [provider]  docusate sodium (COLACE) 100 MG capsule Take 100 mg by mouth 2 (two) times daily.    [provider]  ELIQUIS 5 MG TABS tablet Take 5 mg by mouth 2 (two) times daily. 08/20/22   [provider]  furosemide (LASIX) 20 MG tablet Take 1 tablet (20 mg total) by mouth daily. 09/24/23 09/23/24  Burnadette Pop, MD  latanoprost (XALATAN) 0.005 % ophthalmic solution Place 1 drop into both eyes at bedtime. 01/12/20   [provider]  Magnesium Oxide (MAG-OX PO) Take by mouth.    [provider]  metoprolol succinate (TOPROL-XL) 50 MG 24 hr tablet TAKE 1 TABLET BY MOUTH DAILY WITH OR IMMEDIATELY FOLLOWING A MEAL. 11/23/23   Jonelle Sidle, MD  olmesartan (BENICAR) 5 MG tablet TAKE 2 TABLETS BY MOUTH AT BEDTIME 10/15/23   Jonelle Sidle, MD  Polyethyl Glycol-Propyl Glycol 0.4-0.3 % SOLN Place 1 drop into both eyes 2 (two) times daily as needed (dry eyes).    [provider]  polyethylene glycol (MIRALAX / GLYCOLAX) 17 g packet Take 17 g by mouth at bedtime.    [provider]  potassium chloride SA (KLOR-CON M) 20 MEQ tablet Take 1 tablet (20 mEq total) by mouth daily for 7 days. 09/24/23 10/01/23  Burnadette Pop, MD  traMADol (ULTRAM) 50 MG tablet Take 50 mg by mouth as needed for moderate pain.    [provider]    Family History Family History  Problem Relation Age of Onset   Congestive Heart Failure Mother    Dementia Sister     Social History Social History   Tobacco Use   Smoking status: Never    Passive exposure: Never   Smokeless tobacco: Never  Vaping Use   Vaping status: Never Used  Substance Use Topics   Alcohol use: No   Drug use: No     Allergies   Codeine and Morphine and codeine   Review of Systems Review of Systems Per HPI  Physical Exam Triage  Vital Signs ED Triage Vitals  Encounter Vitals Group     BP 01/02/24 1433 (!) 162/104     Systolic BP Percentile --      Diastolic BP Percentile --      Pulse Rate 01/02/24 1433 83     Resp 01/02/24 1433 20     Temp 01/02/24 1433 (!) 104 F (40 C)     Temp Source 01/02/24 1433 Oral     SpO2 01/02/24 1433 94 %     Weight --  Height --      Head Circumference --      Peak Flow --      Pain Score 01/02/24 1438 5     Pain Loc --      Pain Education --      Exclude from Growth Chart --    No data found.  Updated Vital Signs BP (!) 162/104 (BP Location: Right Arm)   Pulse 83   Temp (!) 104 F (40 C) (Oral)   Resp 20   SpO2 94%   Visual Acuity Right Eye Distance:   Left Eye Distance:   Bilateral Distance:    Right Eye Near:   Left Eye Near:    Bilateral Near:     Physical Exam Vitals and nursing note reviewed.  Constitutional:      General: She is not in acute distress.    Appearance: Normal appearance.  HENT:     Head: Normocephalic.     Right Ear: Tympanic membrane, ear canal and external ear normal.     Left Ear: Tympanic membrane, ear canal and external ear normal.     Nose: Congestion present.     Right Turbinates: Enlarged and swollen.     Left Turbinates: Enlarged and swollen.     Right Sinus: No maxillary sinus tenderness or frontal sinus tenderness.     Left Sinus: No maxillary sinus tenderness or frontal sinus tenderness.     Mouth/Throat:     Lips: Pink.     Mouth: Mucous membranes are moist.     Pharynx: Oropharynx is clear. Uvula midline. Postnasal drip present. No pharyngeal swelling, oropharyngeal exudate, posterior oropharyngeal erythema or uvula swelling.     Comments: Cobblestoning present to posterior oropharynx  Eyes:     Extraocular Movements: Extraocular movements intact.     Conjunctiva/sclera: Conjunctivae normal.     Pupils: Pupils are equal, round, and reactive to light.  Cardiovascular:     Rate and Rhythm: Normal rate and  regular rhythm.     Pulses: Normal pulses.     Heart sounds: Normal heart sounds.  Pulmonary:     Effort: Pulmonary effort is normal. No respiratory distress.     Breath sounds: Normal breath sounds. No stridor. No wheezing, rhonchi or rales.  Abdominal:     General: Bowel sounds are normal.     Palpations: Abdomen is soft.     Tenderness: There is no abdominal tenderness.  Musculoskeletal:     Cervical back: Normal range of motion.  Skin:    General: Skin is warm and dry.  Neurological:     General: No focal deficit present.     Mental Status: She is alert and oriented to person, place, and time.  Psychiatric:        Mood and Affect: Mood normal.        Behavior: Behavior normal.      UC Treatments / Results  Labs (all labs ordered are listed, but only abnormal results are displayed) Labs Reviewed - No data to display  EKG   Radiology No results found.  Procedures Procedures (including critical care time)  Medications Ordered in UC Medications - No data to display  Initial Impression / Assessment and Plan / UC Course  I have reviewed the triage vital signs and the nursing notes.  Pertinent labs & imaging results that were available during my care of the patient were reviewed by me and considered in my medical decision making (see chart for details).  Patient with positive self-administered home COVID test.  Will start Paxlovid.  Review of patient's chart with most recent available labs dated December 2024, creatinine was 0.80 with GFR greater than 60.  Tessalon 100 mg prescribed for cough, and fluticasone 50 mcg nasal spray for nasal congestion and runny nose.  Supportive care recommendations were provided and discussed with the patient to include fluids, rest, over-the-counter Tylenol, normal saline nasal spray, and use of a humidifier during sleep.  Patient was given strict ER follow-up precautions.  Patient was in agreement with this plan of care and verbalized  understanding.  All questions were answered.  Patient stable for discharge.  Final Clinical Impressions(s) / UC Diagnoses   Final diagnoses:  Positive self-administered antigen test for COVID-19     Discharge Instructions      Take medication as prescribed.  You will need to hold your Lipitor (atorvastatin) for 2 weeks. Increase fluids and allow for plenty of rest. May take over-the-counter Tylenol as needed for pain, fever, or general discomfort. May use normal saline nasal spray for nasal congestion and runny nose. For your cough, recommend the use of a humidifier in the bedroom at nighttime during sleep and sleeping elevated on pillows while cough symptoms persist.     ED Prescriptions   None    PDMP not reviewed this encounter.   Abran Cantor, NP 01/02/24 [phone]

## 2024-01-02 NOTE — Discharge Instructions (Addendum)
 Take medication as prescribed.  You will need to hold your Lipitor (atorvastatin) for 2 weeks. Increase fluids and allow for plenty of rest. May take over-the-counter Tylenol as needed for pain, fever, or general discomfort. May use normal saline nasal spray for nasal congestion and runny nose. For your cough, recommend the use of a humidifier in the bedroom at nighttime during sleep and sleeping elevated on pillows while cough symptoms persist. If you develop a fever, you will need to remain home until you have been fever free for 24 hours with no medication. Go to the emergency department immediately if you experience worsening shortness of breath, difficulty breathing, chest pain, or other concerns. Please notify your primary care physician of your recent COVID diagnosis. Follow-up as needed.

## 2024-01-02 NOTE — ED Triage Notes (Signed)
 Pt reports she tested positive for covid earlier today with 1 day of sxs. Husband also has covid  Pt states she wants the antiviral  Last blood draw x 1 week

## 2024-01-05 ENCOUNTER — Ambulatory Visit: Payer: Medicare PPO | Admitting: Cardiology

## 2024-01-05 ENCOUNTER — Encounter (HOSPITAL_COMMUNITY)

## 2024-01-05 ENCOUNTER — Ambulatory Visit (HOSPITAL_COMMUNITY)

## 2024-01-05 DIAGNOSIS — E1129 Type 2 diabetes mellitus with other diabetic kidney complication: Secondary | ICD-10-CM | POA: Diagnosis not present

## 2024-01-05 DIAGNOSIS — U071 COVID-19: Secondary | ICD-10-CM | POA: Diagnosis not present

## 2024-01-05 DIAGNOSIS — I5030 Unspecified diastolic (congestive) heart failure: Secondary | ICD-10-CM | POA: Diagnosis not present

## 2024-01-13 ENCOUNTER — Ambulatory Visit (HOSPITAL_COMMUNITY)

## 2024-01-13 ENCOUNTER — Encounter (HOSPITAL_COMMUNITY)

## 2024-01-14 DIAGNOSIS — L720 Epidermal cyst: Secondary | ICD-10-CM | POA: Diagnosis not present

## 2024-01-14 DIAGNOSIS — L570 Actinic keratosis: Secondary | ICD-10-CM | POA: Diagnosis not present

## 2024-01-14 DIAGNOSIS — L821 Other seborrheic keratosis: Secondary | ICD-10-CM | POA: Diagnosis not present

## 2024-01-14 DIAGNOSIS — L718 Other rosacea: Secondary | ICD-10-CM | POA: Diagnosis not present

## 2024-01-14 DIAGNOSIS — D1801 Hemangioma of skin and subcutaneous tissue: Secondary | ICD-10-CM | POA: Diagnosis not present

## 2024-01-14 DIAGNOSIS — L814 Other melanin hyperpigmentation: Secondary | ICD-10-CM | POA: Diagnosis not present

## 2024-01-14 DIAGNOSIS — B078 Other viral warts: Secondary | ICD-10-CM | POA: Diagnosis not present

## 2024-01-14 DIAGNOSIS — L820 Inflamed seborrheic keratosis: Secondary | ICD-10-CM | POA: Diagnosis not present

## 2024-01-14 DIAGNOSIS — Z85828 Personal history of other malignant neoplasm of skin: Secondary | ICD-10-CM | POA: Diagnosis not present

## 2024-01-19 ENCOUNTER — Ambulatory Visit: Payer: Medicare PPO | Admitting: Cardiology

## 2024-01-19 DIAGNOSIS — G4733 Obstructive sleep apnea (adult) (pediatric): Secondary | ICD-10-CM | POA: Diagnosis not present

## 2024-01-20 ENCOUNTER — Ambulatory Visit (HOSPITAL_BASED_OUTPATIENT_CLINIC_OR_DEPARTMENT_OTHER)
Admission: RE | Admit: 2024-01-20 | Discharge: 2024-01-20 | Disposition: A | Discharge status: Home / Self Care | Source: Ambulatory Visit | Attending: Cardiology | Admitting: Cardiology

## 2024-01-20 ENCOUNTER — Ambulatory Visit (HOSPITAL_COMMUNITY)
Admission: RE | Admit: 2024-01-20 | Discharge: 2024-01-20 | Disposition: A | Discharge status: Home / Self Care | Source: Ambulatory Visit | Attending: Internal Medicine | Admitting: Internal Medicine

## 2024-01-20 ENCOUNTER — Encounter (HOSPITAL_COMMUNITY): Payer: Self-pay

## 2024-01-20 DIAGNOSIS — R0609 Other forms of dyspnea: Secondary | ICD-10-CM | POA: Diagnosis not present

## 2024-01-20 LAB — NM MYOCAR MULTI W/SPECT W/WALL MOTION / EF
Base ST Depression (mm): 0 mm
Estimated workload: 1
LV dias vol: 123 mL (ref 46–106)
LV sys vol: 43 mL
MPHR: 135 {beats}/min
Nuc Stress EF: 65 %
Peak HR: 75 {beats}/min
Percent HR: 55 %
RATE: 0.3
Rest HR: 67 {beats}/min
Rest Nuclear Isotope Dose: 10.8 mCi
SDS: 2
SRS: 2
SSS: 4
ST Depression (mm): 0 mm
Stress Nuclear Isotope Dose: 32 mCi
TID: 1.16

## 2024-01-20 MED ORDER — TECHNETIUM TC 99M TETROFOSMIN IV KIT
10.0000 | PACK | Freq: Once | INTRAVENOUS | Status: AC | PRN
  Administered 2024-01-20: 10.8 via INTRAVENOUS

## 2024-01-20 MED ORDER — SODIUM CHLORIDE FLUSH 0.9 % IV SOLN
INTRAVENOUS | Status: AC
  Administered 2024-01-20: 10 mL via INTRAVENOUS
  Filled 2024-01-20: qty 10

## 2024-01-20 MED ORDER — TECHNETIUM TC 99M TETROFOSMIN IV KIT
30.0000 | PACK | Freq: Once | INTRAVENOUS | Status: AC | PRN
  Administered 2024-01-20: 32 via INTRAVENOUS

## 2024-01-20 MED ORDER — REGADENOSON 0.4 MG/5ML IV SOLN
INTRAVENOUS | Status: AC
  Administered 2024-01-20: 0.4 mg via INTRAVENOUS
  Filled 2024-01-20: qty 5

## 2024-02-08 NOTE — Progress Notes (Unsigned)
 Cardiology Office Note    Date:  02/09/2024  ID:  Sheila Blair, DOB 06/07/1936, MRN [id number] PCP:  Artemisa Bile, MD  Cardiologist:  Teddie Favre, MD  Electrophysiologist:  None   Chief Complaint: f/u dyspnea  History of Present Illness: .    Sheila Blair is a 88 y.o. female with visit-pertinent history of chronic HFpEF, permanent atrial fibrillation, HTN, HLD, mild AI, mild dilation of ascending aorta, anxiety, arthritis, breast CA, hypothyroidism, sleep apnea adherent with CPAP, DM seen for follow-up. She has been seen for h/o HFpEF and dyspnea. Last echo 09/2023 EF 60-65%, RVSF low normal, mildly enlarged RV, severe BAE, mild AI, mild dilation of ascending aorta. CXR 11/2023 by PCP showed reticulonodular opacities of the lungs with bronchial wall thickening, potentially atypical infection or bronchitis.  She recently saw Dr. Mcdowell 12/28/23 for dyspnea. Farxiga  was added. She underwent nuclear stress test 01/20/24 which was low risk, negative for ischemia, EF 65%. Of note, she did test positive for Covid a few days after her last OV here, query contribution to symptoms. She thankfully did not require hospitalization. She reports that since her last visit, her dyspnea has improved. She also noticed she just feels better overall, including her joints. She is pleased with how she is feeling and has no acute concerns today. She sometimes notices some clear phlegm in her throat when she first wakes up but wonders if possibly related to pollen/allergies. No chest pain, bleeding or syncope.  Labwork independently reviewed: 09/2023 K 3.7, Cr 0.80 (prior hypokalemia), Mg 1.8, TSH OK, BNP 283, trop neg, d-dimer neg, AST ALT OK, CBC ok  ROS: .    Please see the history of present illness.  All other systems are reviewed and otherwise negative.  Studies Reviewed: Aaron Aas    EKG:  EKG is ordered today, personally reviewed, demonstrating: EKG Interpretation Date/Time:  Tuesday Feb 09 2024 14:44:05  EDT Ventricular Rate:  59 PR Interval:    QRS Duration:  84 QT Interval:  440 QTC Calculation: 435 R Axis:   73  Text Interpretation: Atrial fibrillation with slow ventricular response (heart rate 59bpm) When compared with ECG of 22-Sep-2023 11:29, No significant change was found Confirmed by Tawn Fitzner [phone]) on 02/09/2024 2:47:32 PM    CV Studies: Cardiac studies reviewed are outlined and summarized above. Otherwise please see EMR for full report.   Current Reported Medications:.    Current Meds  Medication Sig   anastrozole  (ARIMIDEX ) 1 MG tablet TAKE 1 TABLET BY MOUTH DAILY.   atorvastatin  (LIPITOR) 10 MG tablet Take 10 mg by mouth at bedtime.   benzonatate  (TESSALON ) 100 MG capsule Take 1 capsule (100 mg total) by mouth 3 (three) times daily as needed for cough.   Biotin 1000 MCG tablet Take 1,000 mcg by mouth daily.   calcium  carbonate (OS-CAL - DOSED IN MG OF ELEMENTAL CALCIUM ) 1250 (500 Ca) MG tablet Take 1 tablet by mouth daily with breakfast.   cholecalciferol (VITAMIN D3) 25 MCG (1000 UNIT) tablet Take 1,000 Units by mouth daily.   citalopram  (CELEXA ) 20 MG tablet Take 20 mg by mouth daily.   dapagliflozin  propanediol (FARXIGA ) 10 MG TABS tablet Take 1 tablet (10 mg total) by mouth daily before breakfast.   diltiazem  (CARDIZEM  CD) 180 MG 24 hr capsule Take 180 mg by mouth daily.   diphenhydramine -acetaminophen  (TYLENOL  PM) 25-500 MG TABS tablet Take 1 tablet by mouth at bedtime.   docusate sodium  (COLACE) 100 MG capsule Take 100 mg by  mouth 2 (two) times daily.   ELIQUIS  5 MG TABS tablet Take 5 mg by mouth 2 (two) times daily.   furosemide  (LASIX ) 20 MG tablet Take 1 tablet (20 mg total) by mouth daily.   latanoprost  (XALATAN ) 0.005 % ophthalmic solution Place 1 drop into both eyes at bedtime.   metoprolol  succinate (TOPROL -XL) 50 MG 24 hr tablet TAKE 1 TABLET BY MOUTH DAILY WITH OR IMMEDIATELY FOLLOWING A MEAL.   olmesartan  (BENICAR ) 5 MG tablet TAKE 2 TABLETS BY MOUTH AT  BEDTIME   Polyethyl Glycol-Propyl Glycol 0.4-0.3 % SOLN Place 1 drop into both eyes 2 (two) times daily as needed (dry eyes).   polyethylene glycol (MIRALAX  / GLYCOLAX ) 17 g packet Take 17 g by mouth at bedtime.   potassium chloride  SA (KLOR-CON  M) 20 MEQ tablet Take 20 mEq by mouth daily.   traMADol  (ULTRAM ) 50 MG tablet Take 50 mg by mouth as needed for moderate pain.   [DISCONTINUED] potassium chloride  SA (KLOR-CON  M) 20 MEQ tablet Take 1 tablet (20 mEq total) by mouth daily for 7 days. [Patient clarified still taking daily, not short term rx]    Physical Exam:    VS:  BP 138/80 (BP Location: Right Arm, Cuff Size: Normal)   Pulse 69   Ht 5\' 8"  (1.727 m)   Wt 200 lb 3.2 oz (90.8 kg)   SpO2 94%   BMI 30.44 kg/m    Wt Readings from Last 3 Encounters:  02/09/24 200 lb 3.2 oz (90.8 kg)  12/28/23 202 lb 12.8 oz (92 kg)  09/23/23 198 lb 3.1 oz (89.9 kg)    GEN: Well nourished, well developed in no acute distress NECK: No JVD; No carotid bruits CARDIAC: irregularly irregular, no murmurs, rubs, gallops RESPIRATORY:  Clear to auscultation without rales, wheezing or rhonchi  ABDOMEN: Soft, non-tender, non-distended EXTREMITIES:  No edema; No acute deformity   Asessement and Plan:.    1. Dyspnea on exertion, chronic HFpEF - stress test reassuring. Also tested positive for Covid in the days following her office visit, query contribution to symptoms. She feels symptomatically improved with Farxiga  10mg  daily, would continue. Will recheck BMET today given recent initiation, as well as CBC for completeness given ongoing anticoagulation use and recent dyspnea. She will notify for any recurrence in symptoms. Given the abnormal CXR she had in 11/2023 (question atypical infection/bronchitis) I also encouraged routine PCP follow-up as well.  2. Permanent atrial fibrillation - rate controlled - was 59bpm by EKG but 60s on VS. Given that she feels well, would not make any med changes at this time -  continue diltiazem  180mg  daily, Toprol  50mg  daily. Continue Eliquis  5mg  BID - dose appropriate. I did tell her to track her HR at home with BP trends below.  3. Essential HTN - BP historically slightly elevated, but still <140 today. Have advised for her to monitor this at home for several days and relay value to our office after several days of readings.  4. Hyperlipidemia - followed by primary care.  5. Mild AI, mild dilation of ascending aorta - consider repeat echo 09/2024 to follow aortic dimension. Aortic insufficiency itself does not require any specific intervention other than consideration of surveillance echo in 3-5 years if clinical situation dictates.    Disposition: F/u with Dr. Londa Rival in 6 months.  Signed, Shakala Marlatt N Katiana Ruland, PA-C

## 2024-02-09 ENCOUNTER — Ambulatory Visit: Attending: Physician Assistant | Admitting: Physician Assistant

## 2024-02-09 ENCOUNTER — Other Ambulatory Visit (HOSPITAL_COMMUNITY)
Admission: RE | Admit: 2024-02-09 | Discharge: 2024-02-09 | Disposition: A | Discharge status: Home / Self Care | Source: Ambulatory Visit | Attending: Physician Assistant | Admitting: Physician Assistant

## 2024-02-09 ENCOUNTER — Encounter: Payer: Self-pay | Admitting: Physician Assistant

## 2024-02-09 VITALS — BP 138/80 | HR 69 | Ht 68.0 in | Wt 200.2 lb

## 2024-02-09 DIAGNOSIS — I4821 Permanent atrial fibrillation: Secondary | ICD-10-CM

## 2024-02-09 DIAGNOSIS — R0609 Other forms of dyspnea: Secondary | ICD-10-CM | POA: Insufficient documentation

## 2024-02-09 DIAGNOSIS — I10 Essential (primary) hypertension: Secondary | ICD-10-CM

## 2024-02-09 DIAGNOSIS — I351 Nonrheumatic aortic (valve) insufficiency: Secondary | ICD-10-CM | POA: Diagnosis not present

## 2024-02-09 DIAGNOSIS — I5032 Chronic diastolic (congestive) heart failure: Secondary | ICD-10-CM | POA: Diagnosis not present

## 2024-02-09 DIAGNOSIS — E782 Mixed hyperlipidemia: Secondary | ICD-10-CM | POA: Diagnosis not present

## 2024-02-09 DIAGNOSIS — I77810 Thoracic aortic ectasia: Secondary | ICD-10-CM

## 2024-02-09 LAB — BASIC METABOLIC PANEL WITH GFR
Anion gap: 9 (ref 5–15)
BUN: 17 mg/dL (ref 8–23)
CO2: 24 mmol/L (ref 22–32)
Calcium: 9.2 mg/dL (ref 8.9–10.3)
Chloride: 102 mmol/L (ref 98–111)
Creatinine, Ser: 0.83 mg/dL (ref 0.44–1.00)
GFR, Estimated: >60 mL/min (ref >60)
Glucose, Bld: 129 mg/dL — ABNORMAL HIGH (ref 70–99)
Potassium: 3.7 mmol/L (ref 3.5–5.1)
Sodium: 135 mmol/L (ref 135–145)

## 2024-02-09 LAB — CBC
HCT: 40.1 % (ref 36.0–46.0)
Hemoglobin: 13.4 g/dL (ref 12.0–15.0)
MCH: 31.1 pg (ref 26.0–34.0)
MCHC: 33.4 g/dL (ref 30.0–36.0)
MCV: 93 fL (ref 80.0–100.0)
Platelets: 201 10*3/uL (ref 150–400)
RBC: 4.31 MIL/uL (ref 3.87–5.11)
RDW: 14.9 % (ref 11.5–15.5)
WBC: 6.1 10*3/uL (ref 4.0–10.5)
nRBC: 0 % (ref 0.0–0.2)

## 2024-02-09 NOTE — Patient Instructions (Addendum)
 We need to get a better idea of what your blood pressure is running at home. Here are some instructions to follow: - I would recommend using a blood pressure cuff that goes on your arm. The wrist ones can be inaccurate. If you're purchasing one for the first time, try to select one that also reports your heart rate because this can be helpful information as well. - To check your blood pressure, choose a time at least 3 hours after taking your blood pressure medicines. If you can sample it at different times of the day, that's great - it might give you more information about how your blood pressure fluctuates. Remain seated in a chair for 5 minutes quietly beforehand, then check it.  - Please record a list of those readings and call us /send in MyChart message with them for our review after 3 days of readings. Please include your heart rate as well.  Medication Instructions:  Your physician recommends that you continue on your current medications as directed. Please refer to the Current Medication list given to you today.  *If you need a refill on your cardiac medications before your next appointment, please call your pharmacy*  Lab Work: Your physician recommends that you return for lab work today. ( BMET, CBC)   If you have labs (blood work) drawn today and your tests are completely normal, you will receive your results only by: MyChart Message (if you have MyChart) OR A paper copy in the mail If you have any lab test that is abnormal or we need to change your treatment, we will call you to review the results.  Testing/Procedures: NONE   Follow-Up: At [facility], you and your health needs are our priority.  As part of our continuing mission to provide you with exceptional heart care, our providers are all part of one team.  This team includes your primary Cardiologist (physician) and Advanced Practice Providers or APPs (Physician Assistants and Nurse Practitioners) who all work  together to provide you with the care you need, when you need it.  Your next appointment:   6 month(s)  Provider:   You may see Teddie Favre, MD or one of the following Advanced Practice Providers on your designated Care Team:   Woodfin Hays, PA-C  [place], [place] Theotis Flake, [place]     We recommend signing up for the patient portal called "MyChart".  Sign up information is provided on this After Visit Summary.  MyChart is used to connect with patients for Virtual Visits (Telemedicine).  Patients are able to view lab/test results, encounter notes, upcoming appointments, etc.  Non-urgent messages can be sent to your provider as well.   To learn more about what you can do with MyChart, go to [email or web address].   Other Instructions Thank you for choosing [facility]!

## 2024-03-08 ENCOUNTER — Telehealth: Payer: Self-pay

## 2024-03-08 NOTE — Progress Notes (Signed)
   03/08/2024  Patient ID: Sheila Blair, female   DOB: 1936/09/15, 88 y.o.   MRN: [id number]  Adherence Monitoring:  Up to date on meds, documentation in [facility].   Flint Hummer, PharmD

## 2024-03-10 ENCOUNTER — Other Ambulatory Visit: Payer: Self-pay | Admitting: Hematology and Oncology

## 2024-03-15 ENCOUNTER — Telehealth: Payer: Self-pay

## 2024-03-15 NOTE — Telephone Encounter (Signed)
 Adherence Monitoring:  Confirmed refill of atorvastatin , next review on Farxiga  in July

## 2024-03-22 DIAGNOSIS — M1712 Unilateral primary osteoarthritis, left knee: Secondary | ICD-10-CM | POA: Diagnosis not present

## 2024-03-30 DIAGNOSIS — Z79899 Other long term (current) drug therapy: Secondary | ICD-10-CM | POA: Diagnosis not present

## 2024-03-30 DIAGNOSIS — E1129 Type 2 diabetes mellitus with other diabetic kidney complication: Secondary | ICD-10-CM | POA: Diagnosis not present

## 2024-03-30 DIAGNOSIS — I5030 Unspecified diastolic (congestive) heart failure: Secondary | ICD-10-CM | POA: Diagnosis not present

## 2024-03-30 DIAGNOSIS — I4821 Permanent atrial fibrillation: Secondary | ICD-10-CM | POA: Diagnosis not present

## 2024-04-05 ENCOUNTER — Telehealth: Payer: Self-pay

## 2024-04-05 NOTE — Telephone Encounter (Signed)
 Up to date on meds, next review in 2 weeks for olmesartan  refills

## 2024-04-06 DIAGNOSIS — I10 Essential (primary) hypertension: Secondary | ICD-10-CM | POA: Diagnosis not present

## 2024-04-06 DIAGNOSIS — D6851 Activated protein C resistance: Secondary | ICD-10-CM | POA: Diagnosis not present

## 2024-04-06 DIAGNOSIS — I5030 Unspecified diastolic (congestive) heart failure: Secondary | ICD-10-CM | POA: Diagnosis not present

## 2024-04-18 ENCOUNTER — Other Ambulatory Visit: Payer: Self-pay | Admitting: Hematology and Oncology

## 2024-04-18 ENCOUNTER — Ambulatory Visit
Admission: RE | Admit: 2024-04-18 | Discharge: 2024-04-18 | Disposition: A | Discharge status: Home / Self Care | Source: Ambulatory Visit | Attending: Hematology and Oncology | Admitting: Hematology and Oncology

## 2024-04-18 DIAGNOSIS — Z1231 Encounter for screening mammogram for malignant neoplasm of breast: Secondary | ICD-10-CM

## 2024-04-19 ENCOUNTER — Telehealth: Payer: Self-pay

## 2024-04-19 DIAGNOSIS — G4733 Obstructive sleep apnea (adult) (pediatric): Secondary | ICD-10-CM | POA: Diagnosis not present

## 2024-04-19 NOTE — Telephone Encounter (Signed)
 Up to date on meds, next review in August

## 2024-04-21 ENCOUNTER — Inpatient Hospital Stay: Payer: Medicare PPO | Attending: Hematology and Oncology | Admitting: Hematology and Oncology

## 2024-04-21 DIAGNOSIS — Z79811 Long term (current) use of aromatase inhibitors: Secondary | ICD-10-CM | POA: Insufficient documentation

## 2024-04-21 DIAGNOSIS — C50912 Malignant neoplasm of unspecified site of left female breast: Secondary | ICD-10-CM

## 2024-04-21 DIAGNOSIS — Z170 Estrogen receptor positive status [ER+]: Secondary | ICD-10-CM | POA: Insufficient documentation

## 2024-04-21 DIAGNOSIS — M8580 Other specified disorders of bone density and structure, unspecified site: Secondary | ICD-10-CM | POA: Diagnosis not present

## 2024-04-21 DIAGNOSIS — C50911 Malignant neoplasm of unspecified site of right female breast: Secondary | ICD-10-CM | POA: Insufficient documentation

## 2024-04-21 NOTE — Assessment & Plan Note (Signed)
 History of bilateral breast cancers Left breast cancer 2009: Grade 2 IDC 1.3 cm, triple negative, 1/5 lymph node micrometastases, adjuvant radiation, 2 cycles of Taxol and carboplatin discontinued for sepsis Right breast cancer 07/2018: Status post lumpectomy 08/19/2018: Invasive papillary cancer 1.2 cm [id number] ER/PR positive HER-2 negative status post radiation completed 10/26/2018   Current treatment: Anastrozole  started 11/15/2018 Anastrozole  toxicities: Benign Patient will finish 5 years of anastrozole  therapy by February 2025.  At that time she will discontinue anastrozole  therapy   Breast cancer surveillance: 1.  Breast exam 04/21/2024: Benign 2. Mammogram 04/18/2024: Benign breast density category C 3.  Bone density 05/28/2023: T score -1.9: Osteopenia    Return to clinic in 1 year for follow-up

## 2024-04-21 NOTE — Progress Notes (Signed)
 What  Patient Care Team: Sheryle Carwin, MD as PCP - General (Internal Medicine) Debera Jayson MATSU, MD as PCP - Cardiology (Cardiology) Livingston Rigg, MD (Inactive) as Consulting Physician (Dermatology) Sheldon Standing, MD as Consulting Physician (General Surgery) Golda Claudis PENNER, MD (Inactive) as Consulting Physician (Gastroenterology) Odean Potts, MD as Consulting Physician (Hematology and Oncology)  DIAGNOSIS:  Encounter Diagnosis  Name Primary?   Bilateral malignant neoplasm of breast in female, estrogen receptor positive, unspecified site of breast (HCC) Yes    SUMMARY OF ONCOLOGIC HISTORY: Oncology History  Bilateral breast cancer (HCC)  2009 Initial Diagnosis   Left breast cancer 2009: Grade 2 IDC 1.3 cm, triple negative, 1/5 lymph node micrometastases, adjuvant radiation, 2 cycles of Taxol and carboplatin discontinued for sepsis   2019 Relapse/Recurrence   Right breast cancer 07/2018: Status post lumpectomy 08/19/2018: Invasive papillary cancer 1.2 cm T1CN0 ER/PR positive HER-2 negative status post radiation completed 10/26/2018 [facility])   11/15/2018 -  Anti-estrogen oral therapy   Anastrozole      CHIEF COMPLIANT: Follow-up on anastrozole   HISTORY OF PRESENT ILLNESS:  History of Present Illness Sheila Blair is an 88 year old female who presents for follow-up after completing breast cancer treatment with anastrozole .  She was diagnosed with bilateral malignant neoplasm of the breast in 2019, estrogen receptor positive. She has been on anastrozole  since February 2020 and has completed five and a half years of treatment. She has been consistent with her mammogram screenings, with the most recent one conducted on Monday. She feels wonderful and has not experienced any significant health issues over the past year.     ALLERGIES:  is allergic to codeine and morphine  and codeine.  MEDICATIONS:  Current Outpatient Medications  Medication Sig Dispense Refill    atorvastatin  (LIPITOR) 10 MG tablet Take 10 mg by mouth at bedtime.     Biotin 1000 MCG tablet Take 1,000 mcg by mouth daily.     calcium  carbonate (OS-CAL - DOSED IN MG OF ELEMENTAL CALCIUM ) 1250 (500 Ca) MG tablet Take 1 tablet by mouth daily with breakfast.     cholecalciferol (VITAMIN D3) 25 MCG (1000 UNIT) tablet Take 1,000 Units by mouth daily.     citalopram  (CELEXA ) 20 MG tablet Take 20 mg by mouth daily.     dapagliflozin  propanediol (FARXIGA ) 10 MG TABS tablet Take 1 tablet (10 mg total) by mouth daily before breakfast. 30 tablet 11   diltiazem  (CARDIZEM  CD) 180 MG 24 hr capsule Take 180 mg by mouth daily.     diphenhydramine -acetaminophen  (TYLENOL  PM) 25-500 MG TABS tablet Take 1 tablet by mouth at bedtime.     docusate sodium  (COLACE) 100 MG capsule Take 100 mg by mouth 2 (two) times daily.     ELIQUIS  5 MG TABS tablet Take 5 mg by mouth 2 (two) times daily.     furosemide  (LASIX ) 20 MG tablet Take 1 tablet (20 mg total) by mouth daily. 30 tablet 1   latanoprost  (XALATAN ) 0.005 % ophthalmic solution Place 1 drop into both eyes at bedtime.     metoprolol  succinate (TOPROL -XL) 50 MG 24 hr tablet TAKE 1 TABLET BY MOUTH DAILY WITH OR IMMEDIATELY FOLLOWING A MEAL. 90 tablet 2   olmesartan  (BENICAR ) 5 MG tablet TAKE 2 TABLETS BY MOUTH AT BEDTIME 180 tablet 2   Polyethyl Glycol-Propyl Glycol 0.4-0.3 % SOLN Place 1 drop into both eyes 2 (two) times daily as needed (dry eyes).     polyethylene glycol (MIRALAX  / GLYCOLAX ) 17 g packet  Take 17 g by mouth at bedtime.     potassium chloride  SA (KLOR-CON  M) 20 MEQ tablet Take 20 mEq by mouth daily.     traMADol  (ULTRAM ) 50 MG tablet Take 50 mg by mouth as needed for moderate pain.     No current facility-administered medications for this visit.    PHYSICAL EXAMINATION: ECOG PERFORMANCE STATUS: 1 - Symptomatic but completely ambulatory    LABORATORY DATA:  I have reviewed the data as listed    Latest Ref Rng & Units 02/09/2024    3:34 PM  09/23/2023    4:11 AM 09/22/2023   12:38 PM  CMP  Glucose 70 - 99 mg/dL 870  881  896   BUN 8 - 23 mg/dL 17  15  15    Creatinine 0.44 - 1.00 mg/dL 9.16  9.19  9.35   Sodium 135 - 145 mmol/L 135  138  136   Potassium 3.5 - 5.1 mmol/L 3.7  3.7  3.1   Chloride 98 - 111 mmol/L 102  100  101   CO2 22 - 32 mmol/L 24  28  26    Calcium  8.9 - 10.3 mg/dL 9.2  9.1  8.9   Total Protein 6.5 - 8.1 g/dL   6.9   Total Bilirubin <1.2 mg/dL   1.2   Alkaline Phos 38 - 126 U/L   72   AST 15 - 41 U/L   23   ALT 0 - 44 U/L   19     Lab Results  Component Value Date   WBC 6.1 02/09/2024   HGB 13.4 02/09/2024   HCT 40.1 02/09/2024   MCV 93.0 02/09/2024   PLT 201 02/09/2024   NEUTROABS 4.1 09/22/2023    ASSESSMENT & PLAN:  Bilateral breast cancer (HCC) History of bilateral breast cancers Left breast cancer 2009: Grade 2 IDC 1.3 cm, triple negative, 1/5 lymph node micrometastases, adjuvant radiation, 2 cycles of Taxol and carboplatin discontinued for sepsis Right breast cancer 07/2018: Status post lumpectomy 08/19/2018: Invasive papillary cancer 1.2 cm T1CN0 ER/PR positive HER-2 negative status post radiation completed 10/26/2018   Current treatment: Anastrozole  started 11/15/2018 completed 04/21/2024 Anastrozole  toxicities: Benign I discussed with the patient that she completed the 5 years of antiestrogen therapy and therefore she can discontinue it here.  Breast cancer surveillance: 1.  Mammogram 04/18/2024: Benign breast density category C 2.  Bone density 05/28/2023: T score -1.9: Osteopenia    Return to clinic on an as-needed basis  ------------------------------------- Assessment and Plan Assessment & Plan Bilateral malignant neoplasm of breast, estrogen receptor positive No evidence of disease after five and a half years on Anastrozole . Recent mammogram normal. - Discontinue Anastrozole  after five years of therapy. - Continue regular mammograms per guidelines. - Encouraged active  lifestyle with regular exercise and mental activities. - Advised dietary modifications, reducing sugar intake.      No orders of the defined types were placed in this encounter.  The patient has a good understanding of the overall plan. she agrees with it. she will call with any problems that may develop before the next visit here. Total time spent: 30 mins including face to face time and time spent for planning, charting and co-ordination of care   Viinay K Carmello Cabiness, MD 04/21/24

## 2024-04-22 ENCOUNTER — Encounter: Payer: Self-pay | Admitting: Advanced Practice Midwife

## 2024-04-25 DIAGNOSIS — E119 Type 2 diabetes mellitus without complications: Secondary | ICD-10-CM | POA: Diagnosis not present

## 2024-05-10 ENCOUNTER — Telehealth: Payer: Self-pay

## 2024-05-10 NOTE — Telephone Encounter (Signed)
 Up to date on meds, next review in September

## 2024-05-25 DIAGNOSIS — M170 Bilateral primary osteoarthritis of knee: Secondary | ICD-10-CM | POA: Diagnosis not present

## 2024-06-01 DIAGNOSIS — M1712 Unilateral primary osteoarthritis, left knee: Secondary | ICD-10-CM | POA: Diagnosis not present

## 2024-06-01 DIAGNOSIS — M170 Bilateral primary osteoarthritis of knee: Secondary | ICD-10-CM | POA: Diagnosis not present

## 2024-06-01 DIAGNOSIS — M1711 Unilateral primary osteoarthritis, right knee: Secondary | ICD-10-CM | POA: Diagnosis not present

## 2024-06-08 DIAGNOSIS — M170 Bilateral primary osteoarthritis of knee: Secondary | ICD-10-CM | POA: Diagnosis not present

## 2024-06-20 DIAGNOSIS — H401133 Primary open-angle glaucoma, bilateral, severe stage: Secondary | ICD-10-CM | POA: Diagnosis not present

## 2024-07-03 ENCOUNTER — Other Ambulatory Visit: Payer: Self-pay | Admitting: Cardiology

## 2024-07-04 DIAGNOSIS — M170 Bilateral primary osteoarthritis of knee: Secondary | ICD-10-CM | POA: Diagnosis not present

## 2024-07-04 DIAGNOSIS — M1711 Unilateral primary osteoarthritis, right knee: Secondary | ICD-10-CM | POA: Diagnosis not present

## 2024-07-04 DIAGNOSIS — M1712 Unilateral primary osteoarthritis, left knee: Secondary | ICD-10-CM | POA: Diagnosis not present

## 2024-07-08 ENCOUNTER — Emergency Department (HOSPITAL_COMMUNITY)
Admission: EM | Admit: 2024-07-08 | Discharge: 2024-07-08 | Disposition: A | Discharge status: Home / Self Care | Source: Ambulatory Visit | Attending: Emergency Medicine | Admitting: Emergency Medicine

## 2024-07-08 ENCOUNTER — Telehealth: Payer: Self-pay | Admitting: Cardiology

## 2024-07-08 ENCOUNTER — Other Ambulatory Visit: Payer: Self-pay

## 2024-07-08 ENCOUNTER — Emergency Department (HOSPITAL_COMMUNITY)

## 2024-07-08 DIAGNOSIS — J9811 Atelectasis: Secondary | ICD-10-CM | POA: Diagnosis not present

## 2024-07-08 DIAGNOSIS — Z7901 Long term (current) use of anticoagulants: Secondary | ICD-10-CM | POA: Insufficient documentation

## 2024-07-08 DIAGNOSIS — I110 Hypertensive heart disease with heart failure: Secondary | ICD-10-CM | POA: Insufficient documentation

## 2024-07-08 DIAGNOSIS — R030 Elevated blood-pressure reading, without diagnosis of hypertension: Secondary | ICD-10-CM | POA: Insufficient documentation

## 2024-07-08 DIAGNOSIS — Z853 Personal history of malignant neoplasm of breast: Secondary | ICD-10-CM | POA: Diagnosis not present

## 2024-07-08 DIAGNOSIS — E119 Type 2 diabetes mellitus without complications: Secondary | ICD-10-CM | POA: Diagnosis not present

## 2024-07-08 DIAGNOSIS — R519 Headache, unspecified: Secondary | ICD-10-CM | POA: Diagnosis not present

## 2024-07-08 DIAGNOSIS — I509 Heart failure, unspecified: Secondary | ICD-10-CM | POA: Diagnosis not present

## 2024-07-08 DIAGNOSIS — R002 Palpitations: Secondary | ICD-10-CM | POA: Diagnosis not present

## 2024-07-08 DIAGNOSIS — I10 Essential (primary) hypertension: Secondary | ICD-10-CM | POA: Diagnosis not present

## 2024-07-08 DIAGNOSIS — I700 Atherosclerosis of aorta: Secondary | ICD-10-CM | POA: Diagnosis not present

## 2024-07-08 DIAGNOSIS — E039 Hypothyroidism, unspecified: Secondary | ICD-10-CM | POA: Insufficient documentation

## 2024-07-08 LAB — BASIC METABOLIC PANEL WITH GFR
Anion gap: 12 (ref 5–15)
BUN: 13 mg/dL (ref 8–23)
CO2: 24 mmol/L (ref 22–32)
Calcium: 9.5 mg/dL (ref 8.9–10.3)
Chloride: 103 mmol/L (ref 98–111)
Creatinine, Ser: 0.82 mg/dL (ref 0.44–1.00)
GFR, Estimated: >60 mL/min (ref >60)
Glucose, Bld: 92 mg/dL (ref 70–99)
Potassium: 4.1 mmol/L (ref 3.5–5.1)
Sodium: 140 mmol/L (ref 135–145)

## 2024-07-08 LAB — CBC
HCT: 39.5 % (ref 36.0–46.0)
Hemoglobin: 12.7 g/dL (ref 12.0–15.0)
MCH: 30.4 pg (ref 26.0–34.0)
MCHC: 32.2 g/dL (ref 30.0–36.0)
MCV: 94.5 fL (ref 80.0–100.0)
Platelets: 179 K/uL (ref 150–400)
RBC: 4.18 MIL/uL (ref 3.87–5.11)
RDW: 14.6 % (ref 11.5–15.5)
WBC: 5 K/uL (ref 4.0–10.5)
nRBC: 0 % (ref 0.0–0.2)

## 2024-07-08 LAB — TROPONIN T, HIGH SENSITIVITY
Troponin T High Sensitivity: 17 ng/L (ref 0–19)
Troponin T High Sensitivity: 17 ng/L (ref 0–19)

## 2024-07-08 NOTE — Discharge Instructions (Addendum)
 It was a pleasure caring for you today in the emergency department.  Please call your heart doctor on Monday Dr. Debera to arrange follow-up .  Be sure to take your medications as prescribed.  Avoid stimulants such as caffeine and excess.  Please return to the emergency department for any worsening or worrisome symptoms.

## 2024-07-08 NOTE — ED Provider Notes (Signed)
 [place] EMERGENCY DEPARTMENT AT [facility] Provider Note  CSN: [id number] Arrival date & time: 07/08/24 1406  Chief Complaint(s) Hypertension  HPI Sheila Blair is a 88 y.o. female with past medical history as below, significant for atrial fibrillation, type II DM, CHF, hypertension, OSA, osteoarthritis who presents to the ED with complaint of hypertension, palpitations  Patient reports she had a fall around a week ago, tripped over a vacuum cleaner.  She fell on her knees, she was seen at orthopedist office and reportedly had x-rays which were negative.  She has been having intermittent episodes of rapid heart rate, palpitations associate with chest tightness.  She has history of A-fib but does not normally have rapid heart rate.  Her medications have been consistent over the past few months.  She follows with cardiology here locally in [place] with Dr. Debera.  She is anticoagulated she is not currently having any chest pain or palpitations or dyspnea.  She also reports her blood pressure has been intermittently elevated over the past week.  She is currently at her baseline.  Past Medical History Past Medical History:  Diagnosis Date   Anxiety    Arthritis    Atrial fibrillation (HCC)    Atypical nevus 09/21/2013   Mild-Left tricep   Breast cancer (HCC)    BILATERAL    Difficult intubation    PT REPORTS NARROW AIRWAY BUT  HAS HAD 17 SURGERIES WITHOUT A PROBLEM - SURGERIES AT [facility] AND AT [facility] PER PT    Essential hypertension    Hypothyroidism    SCC (squamous cell carcinoma) 12/19/2019   right lower leg anterior-txpbx   SCC (squamous cell carcinoma) 11/12/2021   right arm   SCCA (squamous cell carcinoma) of skin 04/06/2017   RIGHT UPPER ARM-CX3,5FU   Sleep apnea    CPAP    Type 2 diabetes mellitus [facility])    Patient Active Problem List   Diagnosis Date Noted   Acute exacerbation of CHF (congestive heart failure) (HCC) 09/22/2023   DM (diabetes mellitus)  (HCC) 11/25/2022   SBO (small bowel obstruction) (HCC) 11/25/2022   Hypokalemia 11/25/2022   Atrial fibrillation (HCC) 11/25/2022   OA (osteoarthritis) of hip 12/05/2020   Osteoarthritis of right hip 12/05/2020   Bilateral breast cancer (HCC) 07/30/2020   Obstructive sleep apnea 07/19/2015   Hypercholesterolemia 09/08/2013   Hypertension 09/08/2013   Home Medication(s) Prior to Admission medications   Medication Sig Start Date End Date Taking? Authorizing Provider  atorvastatin  (LIPITOR) 10 MG tablet Take 10 mg by mouth at bedtime. 05/30/13   [provider]  Biotin 1000 MCG tablet Take 1,000 mcg by mouth daily.    [provider]  calcium  carbonate (OS-CAL - DOSED IN MG OF ELEMENTAL CALCIUM ) 1250 (500 Ca) MG tablet Take 1 tablet by mouth daily with breakfast.    [provider]  cholecalciferol (VITAMIN D3) 25 MCG (1000 UNIT) tablet Take 1,000 Units by mouth daily.    [provider]  citalopram  (CELEXA ) 20 MG tablet Take 20 mg by mouth daily.    [provider]  dapagliflozin  propanediol (FARXIGA ) 10 MG TABS tablet Take 1 tablet (10 mg total) by mouth daily before breakfast. 12/28/23   Debera Jayson MATSU, MD  diltiazem  (CARDIZEM  CD) 180 MG 24 hr capsule Take 180 mg by mouth daily. 10/01/21   [provider]  diphenhydramine -acetaminophen  (TYLENOL  PM) 25-500 MG TABS tablet Take 1 tablet by mouth at bedtime.    [provider]  docusate  sodium (COLACE) 100 MG capsule Take 100 mg by mouth 2 (two) times daily.    [provider]  ELIQUIS  5 MG TABS tablet Take 5 mg by mouth 2 (two) times daily. 08/20/22   [provider]  furosemide  (LASIX ) 20 MG tablet Take 1 tablet (20 mg total) by mouth daily. 09/24/23 09/23/24  Jillian Buttery, MD  latanoprost  (XALATAN ) 0.005 % ophthalmic solution Place 1 drop into both eyes at bedtime. 01/12/20   [provider]  metoprolol  succinate (TOPROL -XL) 50 MG 24 hr tablet TAKE 1  TABLET BY MOUTH DAILY WITH OR IMMEDIATELY FOLLOWING A MEAL. 11/23/23   Debera Jayson MATSU, MD  olmesartan  (BENICAR ) 5 MG tablet TAKE 2 TABLETS BY MOUTH AT BEDTIME 07/05/24   Debera Jayson MATSU, MD  Polyethyl Glycol-Propyl Glycol 0.4-0.3 % SOLN Place 1 drop into both eyes 2 (two) times daily as needed (dry eyes).    [provider]  polyethylene glycol (MIRALAX  / GLYCOLAX ) 17 g packet Take 17 g by mouth at bedtime.    [provider]  potassium chloride  SA (KLOR-CON  M) 20 MEQ tablet Take 20 mEq by mouth daily.    [provider]  traMADol  (ULTRAM ) 50 MG tablet Take 50 mg by mouth as needed for moderate pain.    [provider]                                                                                                                                    Past Surgical History Past Surgical History:  Procedure Laterality Date   ABDOMINAL HYSTERECTOMY     ABDOMINAL SURGERY     APPENDECTOMY     benign thyroid  tumor     BREAST LUMPECTOMY Left 2008   BREAST LUMPECTOMY Right 2019   BREAST SURGERY     CATARACT EXTRACTION     CHOLECYSTECTOMY     COLONOSCOPY N/A 07/10/2016   Procedure: COLONOSCOPY;  Surgeon: Claudis RAYMOND Rivet, MD;  Location: [facility];  Service: Endoscopy;  Laterality: N/A;  930 - moved to 11/2 @ 9:30   EYE SURGERY  2015   to correct drooping eye lids   HERNIA REPAIR     pseudoptosis Bilateral    rectocyle surgery     SMALL INTESTINE SURGERY     TOTAL HIP ARTHROPLASTY Right 12/05/2020   Procedure: TOTAL HIP ARTHROPLASTY ANTERIOR APPROACH;  Surgeon: Melodi Lerner, MD;  Location: [facility];  Service: Orthopedics;  Laterality: Right;    VEIN LIGATION Right    Family History Family History  Problem Relation Age of Onset   Congestive Heart Failure Mother    Dementia Sister     Social History Social History   Tobacco Use   Smoking status: Never    Passive exposure: Never   Smokeless tobacco: Never  Vaping Use   Vaping status:  Never Used  Substance Use Topics   Alcohol  use: No   Drug  use: No   Allergies Codeine and Morphine  and codeine  Review of Systems A thorough review of systems was obtained and all systems are negative except as noted in the HPI and PMH.   Physical Exam Vital Signs  I have reviewed the triage vital signs BP (!) 160/77   Pulse 66   Temp 97.8 F (36.6 C) (Oral)   Resp 18   Ht 5' 8 (1.727 m)   Wt 89.8 kg   SpO2 96%   BMI 30.11 kg/m  Physical Exam Vitals and nursing note reviewed.  Constitutional:      General: She is not in acute distress.    Appearance: Normal appearance.  HENT:     Head: Normocephalic and atraumatic.     Right Ear: External ear normal.     Left Ear: External ear normal.     Nose: Nose normal.     Mouth/Throat:     Mouth: Mucous membranes are moist.  Eyes:     General: No scleral icterus.       Right eye: No discharge.        Left eye: No discharge.  Cardiovascular:     Rate and Rhythm: Normal rate. Rhythm irregular.     Pulses: Normal pulses.     Heart sounds: Normal heart sounds.  Pulmonary:     Effort: Pulmonary effort is normal. No respiratory distress.     Breath sounds: Normal breath sounds. No stridor.  Abdominal:     General: Abdomen is flat. There is no distension.     Palpations: Abdomen is soft.     Tenderness: There is no abdominal tenderness.  Musculoskeletal:     Cervical back: No rigidity.     Right lower leg: No edema.     Left lower leg: No edema.  Skin:    General: Skin is warm and dry.     Capillary Refill: Capillary refill takes less than 2 seconds.      Neurological:     Mental Status: She is alert.  Psychiatric:        Mood and Affect: Mood normal.        Behavior: Behavior normal. Behavior is cooperative.     ED Results and Treatments Labs (all labs ordered are listed, but only abnormal results are displayed) Labs Reviewed  BASIC METABOLIC PANEL WITH GFR  CBC  TROPONIN T, HIGH SENSITIVITY  TROPONIN T, HIGH  SENSITIVITY                                                                                                                          Radiology DG Chest 2 View Result Date: 07/08/2024 EXAM: 2 VIEW(S) XRAY OF THE CHEST 07/08/2024 03:07:27 PM COMPARISON: 12/04/2023 CLINICAL HISTORY: hypertension. ED for evaluation of hypertension and racing heart. Reports symptoms started 1 week ago. No complaints of chest pain. Did have a recent fall prior to symptoms. Was seen by PCP after fall. BP at home was 174/99 prior  to coming to ED. Does take BP medications and has not missed a dose. No reports of visual changes or headache. States I just hurt all over. FINDINGS: LUNGS AND PLEURA: No focal pulmonary opacity. No pulmonary edema. No pleural effusion. No pneumothorax. Mild linear atelectasis in left mid lung. HEART AND MEDIASTINUM: Stable enlarged cardiac silhouette. Aortic calcification. BONES AND SOFT TISSUES: No acute osseous abnormality. IMPRESSION: 1. No acute cardiopulmonary process. 2. Stable enlarged cardiac silhouette. Electronically signed by: Norleen Boxer MD 07/08/2024 04:05 PM EDT RP Workstation: [id number]    Pertinent labs & imaging results that were available during my care of the patient were reviewed by me and considered in my medical decision making (see MDM for details).  Medications Ordered in ED Medications - No data to display                                                                                                                                   Procedures Procedures  (including critical care time)  Medical Decision Making / ED Course    Medical Decision Making:    MEOSHIA BILLING is a 88 y.o. female with past medical history as below, significant for atrial fibrillation, type II DM, CHF, hypertension, OSA, osteoarthritis who presents to the ED with complaint of hypertension, palpitations. The complaint involves an extensive differential diagnosis and also carries with it  a high risk of complications and morbidity.  Serious etiology was considered. Ddx includes but is not limited to: A-fib, tachyarrhythmia, SVT, ACS, electrolyte derangement, etc.  Complete initial physical exam performed, notably the patient was in no acute distress, resting comfortably.    Reviewed and confirmed nursing documentation for past medical history, family history, social history.  Vital signs reviewed.     Palpitations Elevated blood pressure> - Patient currently in rate controlled A-fib.  She is anticoagulated.  Her medications have been unchanged over the past few weeks. -Intermittent paroxysms of rapid heart rate, irregular. - Work appears reassuring, no evidence of ACS.  EKG reassuring.  She is asymptomatic. -Does not appear to have evidence of hypertensive emergency at this time. - Palpitations likely secondary to rapid A-fib.  She follows with cardiology locally.  Her work up today appears reassuring, no evidence of ACS.  Symptoms likely secondary to paroxysmal rapid A-fib.  She is currently asymptomatic.  Advised her to follow-up with cardiology for further evaluation/tx of her afib    Clinical Course as of 07/08/24 1857  Fri Jul 08, 2024  1617 EKG w/ afib rate controlled  [SG]    Clinical Course User Index [SG] Elnor Jayson LABOR, DO     6:57 PM:  I have discussed the diagnosis/risks/treatment options with the patient and family.  Evaluation and diagnostic testing in the emergency department does not suggest an emergent condition requiring admission or immediate intervention beyond what has been performed at this time.  They will follow up with audiology, PCP.  We also discussed returning to the ED immediately if new or worsening sx occur. We discussed the sx which are most concerning (e.g., sudden worsening pain, fever, inability to tolerate by mouth, chest pain, palpitations, syncope) that necessitate immediate return.    The patient appears reasonably screen and/or  stabilized for discharge and I doubt any other medical condition or other [facility] requiring further screening, evaluation, or treatment in the ED at this time prior to discharge.                 Additional history obtained: -Additional history obtained from family -External records from outside source obtained and reviewed including: Chart review including previous notes, labs, imaging, consultation notes including  Cardiology documentation, medications, prior EKG   Lab Tests: -I ordered, reviewed, and interpreted labs.   The pertinent results include:   Labs Reviewed  BASIC METABOLIC PANEL WITH GFR  CBC  TROPONIN T, HIGH SENSITIVITY  TROPONIN T, HIGH SENSITIVITY    Notable for labs are stable  EKG   EKG Interpretation Date/Time:  Friday July 08 2024 14:47:43 EDT Ventricular Rate:  52 PR Interval:    QRS Duration:  82 QT Interval:  462 QTC Calculation: 429 R Axis:   70  Text Interpretation: Atrial fibrillation with slow ventricular response Abnormal ECG When compared with ECG of 09-Feb-2024 14:44, T wave amplitude has decreased in Anterior leads Confirmed by Elnor Savant ([id number]) on 07/08/2024 4:15:51 PM         Imaging Studies ordered: I ordered imaging studies including chest x-ray I independently visualized the following imaging with scope of interpretation limited to determining acute life threatening conditions related to emergency care; findings noted above I agree with the radiologist interpretation If any imaging was obtained with contrast I closely monitored patient for any possible adverse reaction a/w contrast administration in the emergency department   Medicines ordered and prescription drug management: No orders of the defined types were placed in this encounter.   -I have reviewed the patients home medicines and have made adjustments as needed   Consultations Obtained: Not applicable  Cardiac Monitoring: The patient was maintained on a  cardiac monitor.  I personally viewed and interpreted the cardiac monitored which showed an underlying rhythm of: Atrial fibrillation Continuous pulse oximetry interpreted by myself, 95% on RA.    Social Determinants of Health:  Diagnosis or treatment significantly limited by social determinants of health: former smoker and obesity   Reevaluation: After the interventions noted above, I reevaluated the patient and found that they have improved  Co morbidities that complicate the patient evaluation  Past Medical History:  Diagnosis Date   Anxiety    Arthritis    Atrial fibrillation (HCC)    Atypical nevus 09/21/2013   Mild-Left tricep   Breast cancer (HCC)    BILATERAL    Difficult intubation    PT REPORTS NARROW AIRWAY BUT  HAS HAD 17 SURGERIES WITHOUT A PROBLEM - SURGERIES AT [facility] AND AT [facility] PER PT    Essential hypertension    Hypothyroidism    SCC (squamous cell carcinoma) 12/19/2019   right lower leg anterior-txpbx   SCC (squamous cell carcinoma) 11/12/2021   right arm   SCCA (squamous cell carcinoma) of skin 04/06/2017   RIGHT UPPER ARM-CX3,5FU   Sleep apnea    CPAP    Type 2 diabetes mellitus (HCC)       Dispostion: Disposition decision including need for hospitalization was considered, and patient discharged from emergency department.  Final Clinical Impression(s) / ED Diagnoses Final diagnoses:  Palpitations  Elevated blood pressure reading        Elnor Jayson LABOR, DO 07/08/24 1857

## 2024-07-08 NOTE — Telephone Encounter (Signed)
 Pt c/o of Chest Pain: STAT if active (IN THIS MOMENT) CP, including tightness, pressure, jaw pain, shoulder/upper arm/back pain, SOB, nausea, and vomiting.  1. Are you having CP right now (tightness, pressure, or discomfort)? No, but earlier this morning yes  2. Are you experiencing any other symptoms (ex. SOB, nausea, vomiting, sweating)? Nausea and body aches.   3. How long have you been experiencing CP? Just this morning   4. Is your CP continuous or coming and going? Coming and going   5. Have you taken Nitroglycerin? No  6. If CP returns before callback, please consider calling 911. ?   Pt c/o BP issue: STAT if pt c/o blurred vision, one-sided weakness or slurred speech  1. What are your last 5 BP readings? 157/97 today  2. Are you having any other symptoms (ex. Dizziness, headache, blurred vision, passed out)? None right now   3. What is your BP issue? Pt has concerns about her readings. She stated Dayna wanted her to call and report her readings as well and for the past 2 weeks its been around 160's/103 HR 61.

## 2024-07-08 NOTE — ED Triage Notes (Signed)
 Patient coming to ED for evaluation of hypertension and racing heart.  Reports symptoms started 1 week ago.  No complaints of chest pain.  Did have a recent fall prior to symptoms.  Was seen by PCP after fall.  BP at home was 174/99 prior to coming to ED.  Does take BP medications and has not missed a dose.  No reports of visual changes or headache.  States I just hurt all over.

## 2024-07-08 NOTE — ED Notes (Signed)
 Pt/family received d/c paperwork at this time. After going over the paperwork any questions, comments, or concerns were answered to the best of this nurse's knowledge. The pt/family verbally acknowledged the teachings/instructions.

## 2024-07-08 NOTE — Telephone Encounter (Signed)
 Ms.Bivins tells me she has been SOB for a couple of weeks and had recently fallen last week after tripping on vacuum cord. Was seen by ortho as she has struck both knees.She states she had x-rays done and had no fractures. She also tells me she had hit her face during the fall.  Early this morning she was awakened in her sleep with SSCP lasting several minutes off and on. Now, she says she has nausea aches all over and she just doesn't feel right. Her says her BP is 157/105 and her HR is 61.   She was asking if there was medication she could take and I advised her to be evaluated in the ED, she agrees to go, will have husband drive her.     I will FYI Dr.McDowell

## 2024-07-13 DIAGNOSIS — I5030 Unspecified diastolic (congestive) heart failure: Secondary | ICD-10-CM | POA: Diagnosis not present

## 2024-07-13 DIAGNOSIS — E1129 Type 2 diabetes mellitus with other diabetic kidney complication: Secondary | ICD-10-CM | POA: Diagnosis not present

## 2024-07-13 DIAGNOSIS — E1169 Type 2 diabetes mellitus with other specified complication: Secondary | ICD-10-CM | POA: Diagnosis not present

## 2024-07-13 DIAGNOSIS — Z23 Encounter for immunization: Secondary | ICD-10-CM | POA: Diagnosis not present

## 2024-07-13 DIAGNOSIS — I4821 Permanent atrial fibrillation: Secondary | ICD-10-CM | POA: Diagnosis not present

## 2024-07-13 DIAGNOSIS — I10 Essential (primary) hypertension: Secondary | ICD-10-CM | POA: Diagnosis not present

## 2024-07-13 DIAGNOSIS — Z79899 Other long term (current) drug therapy: Secondary | ICD-10-CM | POA: Diagnosis not present

## 2024-07-29 ENCOUNTER — Ambulatory Visit: Attending: Cardiology | Admitting: Cardiology

## 2024-07-29 ENCOUNTER — Encounter: Payer: Self-pay | Admitting: Cardiology

## 2024-07-29 VITALS — BP 132/78 | HR 73 | Ht 68.0 in | Wt 200.2 lb

## 2024-07-29 DIAGNOSIS — E782 Mixed hyperlipidemia: Secondary | ICD-10-CM | POA: Diagnosis not present

## 2024-07-29 DIAGNOSIS — I5032 Chronic diastolic (congestive) heart failure: Secondary | ICD-10-CM

## 2024-07-29 DIAGNOSIS — I4821 Permanent atrial fibrillation: Secondary | ICD-10-CM

## 2024-07-29 NOTE — Patient Instructions (Signed)
 Medication Instructions:  Your physician recommends that you continue on your current medications as directed. Please refer to the Current Medication list given to you today.   Labwork: None today  Testing/Procedures: None today  Follow-Up: 6 months  Any Other Special Instructions Will Be Listed Below (If Applicable).  If you need a refill on your cardiac medications before your next appointment, please call your pharmacy.

## 2024-07-29 NOTE — Progress Notes (Signed)
    Cardiology Office Note  Date: 07/29/2024   ID: Sheila Blair, DOB Apr 29, 1936, MRN [id number]  History of Present Illness: Sheila Blair is an 88 y.o. female last seen in May by Ms. Dunn PA-C, I reviewed her note.  She is here for a follow-up visit.  I reviewed the chart. She was seen in the ER in early October for evaluation of palpitations, also had elevated blood pressure.  Heart rate was controlled in atrial fibrillation at that time, chest x-ray showed no acute findings, and high-sensitivity troponin T levels were within normal range.  She had had a mechanical fall prior to onset of the symptoms and tells me now that everything she was experiencing then has resolved without specific change in her medications.  We went over her medications, she reports compliance with current therapy and continues to track blood pressure at home.  I rechecked her blood pressure today at 132/78.  Interval lab work as noted below.  She had follow-up ischemic testing in April that was reassuring.  Physical Exam: VS:  BP 132/78 (BP Location: Left Arm)   Pulse 73   Ht 5' 8 (1.727 m)   Wt 200 lb 3.2 oz (90.8 kg)   SpO2 92%   BMI 30.44 kg/m , BMI Body mass index is 30.44 kg/m.  Wt Readings from Last 3 Encounters:  07/29/24 200 lb 3.2 oz (90.8 kg)  07/08/24 198 lb (89.8 kg)  02/09/24 200 lb 3.2 oz (90.8 kg)    General: Patient appears comfortable at rest. HEENT: Conjunctiva and lids normal. Lungs: Clear to auscultation, nonlabored breathing at rest. Cardiac: Irregularly irregular, no gallop. Extremities: Stable venous stasis.  ECG:  An ECG dated 07/11/2024 was personally reviewed today and demonstrated:  Atrial fibrillation at 52 bpm with nonspecific T wave changes.  Labwork: 09/22/2023: ALT 19; AST 23; B Natriuretic Peptide 283.0; Magnesium  1.8; TSH 4.463 07/08/2024: BUN 13; Creatinine, Ser 0.82; Hemoglobin 12.7; Platelets 179; Potassium 4.1; Sodium 140  October 2025: Cholesterol 126,  triglycerides 97, HDL 43, LDL 65, hemoglobin A1c 5.9%  Other Studies Reviewed Today:  Lexiscan  Myoview  01/20/2024:   Findings are consistent with no ischemia. The study is low risk.   No ST deviation was noted. The ECG was negative for ischemia.   LV perfusion is normal.  Breast attenuation artifact noted with no myocardial perfusion defects to indicate scar or ischemia.   Left ventricular function is normal. Nuclear stress EF: 65%.  Assessment and Plan:  1.  Permanent atrial fibrillation with CHA2DS2-VASc score of 6.  LVEF 60 to 65% and left atrium severely dilated by echocardiogram in December 2024.  Heart rate controlled on combination of Toprol -XL 50 mg daily and Cardizem  CD 180 mg daily.  She also remains on Eliquis  5 mg twice daily for stroke prophylaxis.   2.  Primary hypertension.  Continue to track blood pressure at home.  She is on Benicar  10 mg in the evening as well.   3.  Mixed hyperlipidemia.  LDL 65.  Continue Lipitor 10 mg daily.  4.  Dyspnea on exertion, stable in some ways improved overall per discussion today.  She is on Farxiga  10 mg daily along with Benicar  10 mg daily and Lasix  20 mg daily with potassium supplement.  Managing as potential component of HFpEF.  Interval ischemic testing in April was reassuring.  Disposition:  Follow up 6 months.  Signed, Jayson JUDITHANN Sierras, M.D., F.A.C.C. [facility]

## 2024-08-16 ENCOUNTER — Other Ambulatory Visit: Payer: Self-pay | Admitting: Cardiology

## 2024-11-08 ENCOUNTER — Telehealth: Payer: Self-pay

## 2024-11-08 DIAGNOSIS — I4891 Unspecified atrial fibrillation: Secondary | ICD-10-CM

## 2024-11-08 DIAGNOSIS — I10 Essential (primary) hypertension: Secondary | ICD-10-CM

## 2024-11-09 ENCOUNTER — Telehealth: Payer: Self-pay

## 2024-11-09 NOTE — Progress Notes (Signed)
 Complex Care Management Note  Care Guide Note 11/09/2024 Name: CITLALLY CAPTAIN MRN: [id number] DOB: Feb 05, 1936  JAMIRAH ZELAYA is a 89 y.o. year old female who sees Sheryle Carwin, MD for primary care. I reached out to Arlean GORMAN Parker by phone today to offer complex care management services.  Ms. Engert was given information about Complex Care Management services today including:   The Complex Care Management services include support from the care team which includes your Nurse Care Manager, Clinical Social Worker, or Pharmacist.  The Complex Care Management team is here to help remove barriers to the health concerns and goals most important to you. Complex Care Management services are voluntary, and the patient may decline or stop services at any time by request to their care team member.   Complex Care Management Consent Status: Patient agreed to services and verbal consent obtained.   Follow up plan:  Telephone appointment with complex care management team member scheduled for:  11/28/24 @ 9 AM  Encounter Outcome:  Patient Scheduled  Leotis Rase [facility], [facility] Guide  Direct Dial: [phone]  Fax [phone]

## 2024-11-28 ENCOUNTER — Telehealth
